# Patient Record
Sex: Female | Born: 1945 | Race: White | Hispanic: No | Smoking: Former smoker
Health system: Southern US, Community
[De-identification: ages and names within clinical notes are randomized; demographics above are authoritative.]

## PROBLEM LIST (undated history)

## (undated) DIAGNOSIS — C801 Malignant (primary) neoplasm, unspecified: Secondary | ICD-10-CM

## (undated) DIAGNOSIS — I10 Essential (primary) hypertension: Secondary | ICD-10-CM

## (undated) DIAGNOSIS — K635 Polyp of colon: Secondary | ICD-10-CM

## (undated) DIAGNOSIS — H40009 Preglaucoma, unspecified, unspecified eye: Secondary | ICD-10-CM

## (undated) DIAGNOSIS — H269 Unspecified cataract: Secondary | ICD-10-CM

## (undated) DIAGNOSIS — E785 Hyperlipidemia, unspecified: Secondary | ICD-10-CM

## (undated) DIAGNOSIS — K5790 Diverticulosis of intestine, part unspecified, without perforation or abscess without bleeding: Secondary | ICD-10-CM

## (undated) DIAGNOSIS — M810 Age-related osteoporosis without current pathological fracture: Secondary | ICD-10-CM

## (undated) DIAGNOSIS — K219 Gastro-esophageal reflux disease without esophagitis: Secondary | ICD-10-CM

## (undated) HISTORY — PX: COLONOSCOPY: SHX174

## (undated) HISTORY — PX: DEEP LEG / ANKLE TUMOR EXCISION: SUR572

## (undated) HISTORY — DX: Polyp of colon: K63.5

## (undated) HISTORY — DX: Preglaucoma, unspecified, unspecified eye: H40.009

## (undated) HISTORY — DX: Age-related osteoporosis without current pathological fracture: M81.0

## (undated) HISTORY — PX: TUBAL LIGATION: SHX77

## (undated) HISTORY — DX: Malignant (primary) neoplasm, unspecified: C80.1

## (undated) HISTORY — DX: Diverticulosis of intestine, part unspecified, without perforation or abscess without bleeding: K57.90

## (undated) HISTORY — DX: Hyperlipidemia, unspecified: E78.5

## (undated) HISTORY — PX: TONSILLECTOMY AND ADENOIDECTOMY: SHX28

## (undated) HISTORY — DX: Gastro-esophageal reflux disease without esophagitis: K21.9

## (undated) HISTORY — DX: Unspecified cataract: H26.9

---

## 1997-06-23 ENCOUNTER — Other Ambulatory Visit: Admission: RE | Admit: 1997-06-23 | Discharge: 1997-06-23 | Payer: Self-pay | Admitting: Obstetrics and Gynecology

## 1997-08-17 ENCOUNTER — Ambulatory Visit (HOSPITAL_COMMUNITY): Admission: RE | Admit: 1997-08-17 | Discharge: 1997-08-17 | Payer: Self-pay | Admitting: Internal Medicine

## 1998-03-23 ENCOUNTER — Ambulatory Visit (HOSPITAL_COMMUNITY): Admission: RE | Admit: 1998-03-23 | Discharge: 1998-03-23 | Payer: Self-pay | Admitting: Internal Medicine

## 1998-03-23 ENCOUNTER — Encounter: Payer: Self-pay | Admitting: Internal Medicine

## 1998-06-24 ENCOUNTER — Other Ambulatory Visit: Admission: RE | Admit: 1998-06-24 | Discharge: 1998-06-24 | Payer: Self-pay | Admitting: Obstetrics and Gynecology

## 1998-12-12 ENCOUNTER — Other Ambulatory Visit: Admission: RE | Admit: 1998-12-12 | Discharge: 1998-12-12 | Payer: Self-pay | Admitting: Obstetrics and Gynecology

## 1999-01-10 ENCOUNTER — Encounter (INDEPENDENT_AMBULATORY_CARE_PROVIDER_SITE_OTHER): Payer: Self-pay | Admitting: Specialist

## 1999-01-10 ENCOUNTER — Other Ambulatory Visit: Admission: RE | Admit: 1999-01-10 | Discharge: 1999-01-10 | Payer: Self-pay | Admitting: Obstetrics and Gynecology

## 1999-03-27 ENCOUNTER — Ambulatory Visit (HOSPITAL_COMMUNITY): Admission: RE | Admit: 1999-03-27 | Discharge: 1999-03-27 | Payer: Self-pay | Admitting: Internal Medicine

## 1999-07-19 ENCOUNTER — Other Ambulatory Visit: Admission: RE | Admit: 1999-07-19 | Discharge: 1999-07-19 | Payer: Self-pay | Admitting: Obstetrics and Gynecology

## 2000-03-27 ENCOUNTER — Ambulatory Visit (HOSPITAL_COMMUNITY): Admission: RE | Admit: 2000-03-27 | Discharge: 2000-03-27 | Payer: Self-pay | Admitting: Obstetrics

## 2000-08-16 ENCOUNTER — Other Ambulatory Visit: Admission: RE | Admit: 2000-08-16 | Discharge: 2000-08-16 | Payer: Self-pay | Admitting: Obstetrics and Gynecology

## 2001-04-01 ENCOUNTER — Ambulatory Visit (HOSPITAL_COMMUNITY): Admission: RE | Admit: 2001-04-01 | Discharge: 2001-04-01 | Payer: Self-pay | Admitting: Internal Medicine

## 2001-09-09 ENCOUNTER — Other Ambulatory Visit: Admission: RE | Admit: 2001-09-09 | Discharge: 2001-09-09 | Payer: Self-pay | Admitting: Obstetrics and Gynecology

## 2002-04-07 ENCOUNTER — Ambulatory Visit (HOSPITAL_COMMUNITY): Admission: RE | Admit: 2002-04-07 | Discharge: 2002-04-07 | Payer: Self-pay | Admitting: *Deleted

## 2002-10-19 ENCOUNTER — Other Ambulatory Visit: Admission: RE | Admit: 2002-10-19 | Discharge: 2002-10-19 | Payer: Self-pay | Admitting: Obstetrics and Gynecology

## 2003-04-12 ENCOUNTER — Ambulatory Visit (HOSPITAL_COMMUNITY): Admission: RE | Admit: 2003-04-12 | Discharge: 2003-04-12 | Payer: Self-pay | Admitting: Obstetrics and Gynecology

## 2003-11-02 ENCOUNTER — Other Ambulatory Visit: Admission: RE | Admit: 2003-11-02 | Discharge: 2003-11-02 | Payer: Self-pay | Admitting: Obstetrics and Gynecology

## 2003-12-14 ENCOUNTER — Ambulatory Visit: Payer: Self-pay | Admitting: Internal Medicine

## 2004-04-18 ENCOUNTER — Ambulatory Visit (HOSPITAL_COMMUNITY): Admission: RE | Admit: 2004-04-18 | Discharge: 2004-04-18 | Payer: Self-pay | Admitting: *Deleted

## 2004-12-07 ENCOUNTER — Other Ambulatory Visit: Admission: RE | Admit: 2004-12-07 | Discharge: 2004-12-07 | Payer: Self-pay | Admitting: Obstetrics and Gynecology

## 2004-12-11 ENCOUNTER — Ambulatory Visit: Payer: Self-pay | Admitting: Internal Medicine

## 2005-03-19 ENCOUNTER — Ambulatory Visit: Payer: Self-pay | Admitting: Internal Medicine

## 2005-05-08 ENCOUNTER — Ambulatory Visit (HOSPITAL_COMMUNITY): Admission: RE | Admit: 2005-05-08 | Discharge: 2005-05-08 | Payer: Self-pay | Admitting: Obstetrics and Gynecology

## 2006-05-14 ENCOUNTER — Ambulatory Visit (HOSPITAL_COMMUNITY): Admission: RE | Admit: 2006-05-14 | Discharge: 2006-05-14 | Payer: Self-pay | Admitting: Obstetrics and Gynecology

## 2006-07-02 ENCOUNTER — Ambulatory Visit: Payer: Self-pay | Admitting: Internal Medicine

## 2007-02-25 ENCOUNTER — Encounter: Payer: Self-pay | Admitting: Internal Medicine

## 2007-03-06 ENCOUNTER — Ambulatory Visit: Payer: Self-pay | Admitting: Internal Medicine

## 2007-03-06 DIAGNOSIS — E1169 Type 2 diabetes mellitus with other specified complication: Secondary | ICD-10-CM | POA: Insufficient documentation

## 2007-03-06 DIAGNOSIS — I10 Essential (primary) hypertension: Secondary | ICD-10-CM | POA: Insufficient documentation

## 2007-03-06 DIAGNOSIS — E782 Mixed hyperlipidemia: Secondary | ICD-10-CM

## 2007-05-27 ENCOUNTER — Ambulatory Visit (HOSPITAL_COMMUNITY): Admission: RE | Admit: 2007-05-27 | Discharge: 2007-05-27 | Payer: Self-pay | Admitting: Obstetrics and Gynecology

## 2007-10-30 ENCOUNTER — Telehealth (INDEPENDENT_AMBULATORY_CARE_PROVIDER_SITE_OTHER): Payer: Self-pay | Admitting: *Deleted

## 2008-11-10 ENCOUNTER — Encounter (INDEPENDENT_AMBULATORY_CARE_PROVIDER_SITE_OTHER): Payer: Self-pay | Admitting: *Deleted

## 2010-03-17 ENCOUNTER — Encounter: Payer: Self-pay | Admitting: Internal Medicine

## 2010-03-17 ENCOUNTER — Encounter (INDEPENDENT_AMBULATORY_CARE_PROVIDER_SITE_OTHER): Payer: Medicare PPO | Admitting: Internal Medicine

## 2010-03-17 ENCOUNTER — Other Ambulatory Visit: Payer: Self-pay | Admitting: Internal Medicine

## 2010-03-17 DIAGNOSIS — R030 Elevated blood-pressure reading, without diagnosis of hypertension: Secondary | ICD-10-CM

## 2010-03-17 DIAGNOSIS — Z23 Encounter for immunization: Secondary | ICD-10-CM

## 2010-03-17 DIAGNOSIS — Z0000 Encounter for general adult medical examination without abnormal findings: Secondary | ICD-10-CM

## 2010-03-17 DIAGNOSIS — E782 Mixed hyperlipidemia: Secondary | ICD-10-CM

## 2010-03-17 DIAGNOSIS — Z860100 Personal history of colon polyps, unspecified: Secondary | ICD-10-CM | POA: Insufficient documentation

## 2010-03-17 DIAGNOSIS — E785 Hyperlipidemia, unspecified: Secondary | ICD-10-CM

## 2010-03-17 DIAGNOSIS — K219 Gastro-esophageal reflux disease without esophagitis: Secondary | ICD-10-CM

## 2010-03-17 LAB — CBC WITH DIFFERENTIAL/PLATELET
Basophils Relative: 0.4 % (ref 0.0–3.0)
HCT: 43 % (ref 36.0–46.0)
Hemoglobin: 14.5 g/dL (ref 12.0–15.0)
Lymphocytes Relative: 27.2 % (ref 12.0–46.0)
MCHC: 33.7 g/dL (ref 30.0–36.0)
Monocytes Relative: 7.4 % (ref 3.0–12.0)
Neutro Abs: 5.3 10*3/uL (ref 1.4–7.7)
RBC: 4.52 Mil/uL (ref 3.87–5.11)

## 2010-03-17 LAB — LIPID PANEL
Total CHOL/HDL Ratio: 4
Triglycerides: 80 mg/dL (ref 0.0–149.0)

## 2010-03-17 LAB — BASIC METABOLIC PANEL
CO2: 30 mEq/L (ref 19–32)
Calcium: 9.5 mg/dL (ref 8.4–10.5)
GFR: 108.74 mL/min (ref >60.00)
Potassium: 4.1 mEq/L (ref 3.5–5.1)
Sodium: 141 mEq/L (ref 135–145)

## 2010-03-17 LAB — HEPATIC FUNCTION PANEL
AST: 19 U/L (ref 0–37)
Albumin: 3.9 g/dL (ref 3.5–5.2)
Alkaline Phosphatase: 49 U/L (ref 39–117)
Total Protein: 6.7 g/dL (ref 6.0–8.3)

## 2010-03-23 NOTE — Assessment & Plan Note (Signed)
Summary: cpx/kn   Vital Signs:  Patient profile:   65 year old female Height:      65 inches Weight:      161.2 pounds BMI:     26.92 Temp:     98.4 degrees F oral Pulse rate:   64 / minute Resp:     14 per minute BP sitting:   130 / 86  (left arm) Cuff size:   large  Vitals Entered By: Shonna Chock CMA (March 17, 2010 8:24 AM) CC: CPX with fasting labs , Heartburn, Lipid Management Comments Patient refused EKG   CC:  CPX with fasting labs , Heartburn, and Lipid Management.  History of Present Illness:    Susan Holland is here for her initial Medicare Wellness physical; she describes frequent "gas" issues w/o bloating &  weight gain of 10#, but denies acid reflux, sour taste in mouth, epigastric pain, chest pain, and trouble swallowing.  The patient denies the following alarm features: melena, dysphagia, hematemesis, and vomiting.  Symptoms are worse  after meals. Gas-X has been  partially effective:      Hyperlipidemia Follow-Up: Statin Rx  was never filled.The patient denies the following symptoms: exercise intolerance, dypsnea, palpitations, syncope, and pedal edema.  Dietary compliance has been fair.  The patient reports no exercise.  Adjunctive measures currently used by the patient include ASA and fish oil supplements.      Hypertension Follow-Up:She is not on BP meds. The patient denies lightheadedness, urinary frequency, headaches, and fatigue.  Adjunctive measures currently used by the patient include salt restriction.                                                                                                                                                          Medicare AWV: 1.Risk factors based on Past M, S, F history:see Diagnoses; chart updated 2.Physical Activities: see above 3.Depression/mood:depression denied  4.Hearing:no deficit to whisper @ 6 ft  5.ADL's: no limitations 6.Fall Risk:denied  7.Home Safety:no issues  8.Height, weight, &visual acuity:wall  chart read @ 6 ft 9.Counseling: POA & Living Will discussed 10.Labs ordered based on risk factors: see Orders 11.Referral Coordination: see Preventive Care recommendations( Colonoscopy 2005; PAP 2010;mammograms 2010; BMD 2010)Flu & Tetanus immunizations up to date. 12.Care Plan:see Instructions 13.  Cognitive Assessment : Oriented X 3; memory & recall good; mood & affect normal; math performance good.  Lipid Management History:      Positive NCEP/ATP III risk factors include female age 43 years old or older, early menopause without estrogen hormone replacement, current tobacco user, and hypertension.  Negative NCEP/ATP III risk factors include non-diabetic, no family history for ischemic heart disease, no ASHD (atherosclerotic heart disease), no prior stroke/TIA, no peripheral vascular disease, and no history of aortic aneurysm.     Preventive Screening-Counseling &  Management  Alcohol-Tobacco     Alcohol drinks/day: 0     Smoking Status: current     Packs/Day: <0.25     Year Started: 1965  Caffeine-Diet-Exercise     Caffeine use/day: > 3 / day  Hep-HIV-STD-Contraception     Dental Visit-last 6 months yes     Sun Exposure-Excessive: no  Safety-Violence-Falls     Seat Belt Use: yes     Smoke Detectors: yes      Blood Transfusions:  no.        Travel History:  never.    Current Medications (verified): 1)  Fish Oil 2)  Calcium 3)  Baby Asa 4)  Multivitamin  Allergies (verified): No Known Drug Allergies  Past History:  Past Medical History: Hypercholesterolemia: NMR Lipoprofile 2007: LDL 165( 1909/1024), HDL 45, TG 108. Framingham Study LDL goal = < 130. Benign tumor right ankle resected X2 , Dr Marlyne Beards , [place] 1997 Colonic polyps, PMH  of, Hyperplastic   Past Surgical History: gravida 2 para 2; R ankle surgey X 2 for benign lesion  Carpal tunnel surgery  right wrist 1999 Colonoscopy polyp 12/2003( due 2012 as per Dr Juanda Chance)  Family History: father: died  @ age 39  CVA mother:[place],s sister: elevated lipids, HTN, AF  Social History: Current Smoker:@ least  1 cigarette/ day  Retired Married Alcohol use-yes: occasionally Caffeine use/day:  > 3 / day Dental Care w/in 6 mos.:  yes Sun Exposure-Excessive:  no Seat Belt Use:  yes Blood Transfusions:  no Packs/Day:  <0.25  Review of Systems  The patient denies anorexia, fever, vision loss, decreased hearing, hoarseness, prolonged cough, hemoptysis, hematuria, suspicious skin lesions, depression, unusual weight change, abnormal bleeding, enlarged lymph nodes, and angioedema.    Physical Exam  General:  well-nourished,in no acute distress; alert,appropriate and cooperative throughout examination Head:  Normocephalic and atraumatic without obvious abnormalities.  Eyes:  No corneal or conjunctival inflammation noted. Perrla. Funduscopic exam benign, without hemorrhages, exudates or papilledema. Vision grossly normal to wall chart. Ears:  External ear exam shows no significant lesions or deformities.  Otoscopic examination reveals clear canals, tympanic membranes are intact bilaterally without bulging, retraction, inflammation or discharge. Hearing is grossly normal bilaterally. Nose:  External nasal examination shows no deformity or inflammation. Nasal mucosa are pink and moist without lesions or exudates. Mouth:  Oral mucosa and oropharynx without lesions or exudates.  Teeth in good repair. Neck:  No deformities, masses, or tenderness noted. Lungs:  Normal respiratory effort, chest expands symmetrically. Lungs are clear to auscultation, no crackles or wheezes. Heart:  normal rate, regular rhythm, no gallop, no rub, no JVD, no HJR, and grade 1 /6 systolic murmur.   Abdomen:  Bowel sounds positive,abdomen soft and non-tender without masses, organomegaly or hernias noted. Genitalia:  Gyn F/U overdue ; risks discussed Msk:  No deformity or scoliosis noted of thoracic or lumbar spine.   Pulses:  R and L  carotid,radial,dorsalis pedis and posterior tibial pulses are full and equal bilaterally Extremities:  No clubbing, cyanosis, edema, or deformity noted with normal full range of motion of all joints.   Neurologic:  alert & oriented X3 and DTRs symmetrical and normal.   Skin:  Intact without suspicious lesions or rashes Cervical Nodes:  No lymphadenopathy noted Axillary Nodes:  No palpable lymphadenopathy Psych:  memory intact for recent and remote, normally interactive, and good eye contact.     Impression & Recommendations:  Problem # 1:  PREVENTIVE HEALTH CARE (ICD-V70.0)  Orders: Medicare -  1st Annual Wellness Visit [phone])  Problem # 2:  GERD (ICD-530.81)  Clinical presentation as  "gas"  Orders: Venipuncture (01027) TLB-CBC Platelet - w/Differential (85025-CBCD) Specimen Handling (25366)  Problem # 3:  ELEVATED BLOOD PRESSURE WITHOUT DIAGNOSIS OF HYPERTENSION (ICD-796.2) BP goal discussed The following medications were removed from the medication list:    Lisinopril 20 Mg Tabs (Lisinopril) .Marland Kitchen... 1 once daily if bp averages > 130/85  Orders: EKG w/ Interpretation (93000) Venipuncture (44034) TLB-BMP (Basic Metabolic Panel-BMET) (80048-METABOL) Specimen Handling (74259)  Problem # 4:  HYPERLIPIDEMIA (ICD-272.2)  The following medications were removed from the medication list:    Pravastatin Sodium 40 Mg Tabs (Pravastatin sodium) .Marland Kitchen... 1 qhs  Orders: Venipuncture (56387) TLB-Lipid Panel (80061-LIPID) TLB-Hepatic/Liver Function Pnl (80076-HEPATIC) TLB-TSH (Thyroid Stimulating Hormone) (84443-TSH) Specimen Handling (56433)  Problem # 5:  COLONIC POLYPS, HX OF (ICD-V12.72) as per Dr Juanda Chance Orders: Venipuncture (29518) TLB-CBC Platelet - w/Differential (85025-CBCD)  Complete Medication List: 1)  Fish Oil  2)  Calcium  3)  Baby Asa  4)  Multivitamin   Other Orders: Tdap => 79yrs IM (84166) Admin 1st Vaccine (06301)  Lipid Assessment/Plan:      Based on  NCEP/ATP III, the patient's risk factor category is "0-1 risk factors".  The patient's lipid goals are as follows: Total cholesterol goal is 200; LDL cholesterol goal is 130; HDL cholesterol goal is 40; Triglyceride goal is 150.    Patient Instructions: 1)  Schedule your mammogram. 2)  You need to have a Pap Smear @ least every 2 years  to  screen for  cervical cancer. 3)  Check your Blood Pressure regularly. If it is above:  135/85 ON AVERAGE you should make an appointment. 4)  Stop Smoking Tips: Choose a Quit date. Cut down before the Quit date. decide what you will do as a substitute when you feel the urge to smoke(gum,toothpick,exercise). 5)  It is important that you exercise regularly at least 20 minutes 5 times a week. If you develop chest pain, have severe difficulty breathing, or feel very tired , stop exercising immediately and seek medical attention.   Orders Added: 1)  Tdap => 8yrs IM [90715] 2)  Admin 1st Vaccine [90471] 3)  Medicare -1st Annual Wellness Visit [G0438] 4)  Est. Patient Level III [60109] 5)  EKG w/ Interpretation [93000] 6)  Venipuncture [36415] 7)  TLB-Lipid Panel [80061-LIPID] 8)  TLB-BMP (Basic Metabolic Panel-BMET) [80048-METABOL] 9)  TLB-CBC Platelet - w/Differential [85025-CBCD] 10)  TLB-Hepatic/Liver Function Pnl [80076-HEPATIC] 11)  TLB-TSH (Thyroid Stimulating Hormone) [84443-TSH] 12)  Specimen Handling [99000]   Immunizations Administered:  Tetanus Vaccine:    Vaccine Type: Tdap    Site: right deltoid    Mfr: GlaxoSmithKline    Dose: 0.5 ml    Route: IM    Given by: Shonna Chock CMA    Exp. Date: 11/25/2011    Lot #: [id number]    VIS given: 12/24/07 version given March 17, 2010.   Immunizations Administered:  Tetanus Vaccine:    Vaccine Type: Tdap    Site: right deltoid    Mfr: GlaxoSmithKline    Dose: 0.5 ml    Route: IM    Given by: Shonna Chock CMA    Exp. Date: 11/25/2011    Lot #: [id number]    VIS given: 12/24/07 version  given March 17, 2010.

## 2010-04-04 ENCOUNTER — Other Ambulatory Visit: Payer: Self-pay | Admitting: Family Medicine

## 2010-04-24 ENCOUNTER — Encounter (INDEPENDENT_AMBULATORY_CARE_PROVIDER_SITE_OTHER): Payer: Medicare PPO | Admitting: Occupational Therapy

## 2010-04-24 ENCOUNTER — Other Ambulatory Visit: Payer: Self-pay

## 2010-04-24 ENCOUNTER — Other Ambulatory Visit (HOSPITAL_COMMUNITY)
Admission: RE | Admit: 2010-04-24 | Discharge: 2010-04-24 | Disposition: A | Discharge status: Home / Self Care | Payer: Medicare PPO | Source: Ambulatory Visit | Attending: Family Medicine | Admitting: Family Medicine

## 2010-04-24 DIAGNOSIS — Z01419 Encounter for gynecological examination (general) (routine) without abnormal findings: Secondary | ICD-10-CM | POA: Insufficient documentation

## 2010-04-28 NOTE — Progress Notes (Signed)
Susan Holland, Susan Holland              ACCOUNT NO.:  [id number]  MEDICAL RECORD NO.:  [id number]           PATIENT TYPE:  A  LOCATION:  [facility]                   FACILITY:  [facility]  PHYSICIAN:  Waldron Labs  DATE OF BIRTH:  09/22/1945  DATE OF SERVICE:  04/24/2010                                 CLINIC NOTE  REASON FOR TODAY'S VISIT:  Pap smear.  HISTORY OF PRESENT ILLNESS:  Susan Holland is a 65 year old G2, P2 who is postmenopausal about 25 years, here for her annual exam.  She is without complaints today except ongoing issues with excessive gas.  She does not have any pain.  She does have flatulence.  This is a new problem and she is not having any issues with bowel or bladder control, but she does say she has trouble controlling her flatulence.  She has tried Gas-X and dietary changes and none of this has helped.  REVIEW OF SYSTEMS:  Otherwise negative.  GYNECOLOGIC AND OB HISTORY:  The last Pap smears in 2010 that was normal.  She has no history of abnormal Pap smears.  She had been pregnant twice and has had 2 normal vaginal deliveries.  PAST MEDICAL HISTORY:  Negative.  SURGICAL HISTORY:  She had a surgery on her fallopian tubes 30+ years ago when she was trying to conceive due to fertility.  No other surgeries.  FAMILY HISTORY:  Positive for high blood pressure in her father and sister.  MEDICATIONS:  See med list.  ALLERGIES:  NKDA.  OBJECTIVE:  VITAL SIGNS:  Temperature 98.4, pulse 61, blood pressure 141/89, weight was 156.6. GENERAL:  Well-appearing female in no acute distress. ABDOMEN:  Soft and nontender. PELVIC:  External genitalia within normal limits, slightly atrophic vaginal mucosa and cervix.  No vaginal discharge.  Pap smear collected from cervical os. PSYCHIATRY:  A and O x4. EXTREMITIES:  Full range of motion, within normal limits.  ASSESSMENT AND PLAN:  A 65 year old G2, P2 postmenopausal who is here for well-woman exam.  Pap smear  collected today.  We discussed Pap smear guidelines.  The patient will return annually for exams and we will plan on repeat Pap smear in 3 years or sooner as indicated.  She had a mammogram yesterday and will follow up __________.  She has a colonoscopy scheduled in July 2012.  She will see her primary care doctor regarding her complaints of gas or other primary care issues. Her blood pressure was slightly elevated today, which she says usually when she sees a doctor where she will follow up with her primary care doctor as needed.  She will return here annually or with other gynecologic complaints and concerns.          ______________________________ Waldron Labs    NKF/MEDQ  D:  04/24/2010  T:  04/25/2010  Job:  [id number]

## 2010-05-08 ENCOUNTER — Encounter: Payer: Self-pay | Admitting: Internal Medicine

## 2010-05-15 ENCOUNTER — Encounter: Payer: Self-pay | Admitting: Internal Medicine

## 2010-06-22 ENCOUNTER — Other Ambulatory Visit (HOSPITAL_COMMUNITY)
Admission: RE | Admit: 2010-06-22 | Discharge: 2010-06-22 | Disposition: A | Discharge status: Home / Self Care | Payer: Medicare PPO | Source: Ambulatory Visit | Attending: Obstetrics & Gynecology | Admitting: Obstetrics & Gynecology

## 2010-06-22 ENCOUNTER — Other Ambulatory Visit: Payer: Self-pay | Admitting: Obstetrics & Gynecology

## 2010-06-22 ENCOUNTER — Encounter (INDEPENDENT_AMBULATORY_CARE_PROVIDER_SITE_OTHER): Payer: Medicare PPO | Admitting: Obstetrics & Gynecology

## 2010-06-22 DIAGNOSIS — R87619 Unspecified abnormal cytological findings in specimens from cervix uteri: Secondary | ICD-10-CM

## 2010-07-20 ENCOUNTER — Ambulatory Visit: Payer: Medicare PPO | Admitting: Obstetrics & Gynecology

## 2010-11-24 ENCOUNTER — Encounter: Payer: Self-pay | Admitting: Internal Medicine

## 2011-02-08 ENCOUNTER — Encounter: Payer: Self-pay | Admitting: Internal Medicine

## 2011-02-13 ENCOUNTER — Encounter: Payer: Self-pay | Admitting: Internal Medicine

## 2011-02-27 ENCOUNTER — Ambulatory Visit (AMBULATORY_SURGERY_CENTER): Payer: MEDICARE

## 2011-02-27 ENCOUNTER — Encounter: Payer: Self-pay | Admitting: Internal Medicine

## 2011-02-27 VITALS — Ht 64.5 in | Wt 161.6 lb

## 2011-02-27 DIAGNOSIS — Z1211 Encounter for screening for malignant neoplasm of colon: Secondary | ICD-10-CM

## 2011-02-27 DIAGNOSIS — Z86010 Personal history of colonic polyps: Secondary | ICD-10-CM

## 2011-02-27 MED ORDER — PEG-KCL-NACL-NASULF-NA ASC-C 100 G PO SOLR: 1.0000 | Freq: Once | ORAL | Status: AC

## 2011-03-09 HISTORY — PX: COLONOSCOPY W/ POLYPECTOMY: SHX1380

## 2011-03-13 ENCOUNTER — Ambulatory Visit (AMBULATORY_SURGERY_CENTER): Payer: MEDICARE | Admitting: Internal Medicine

## 2011-03-13 ENCOUNTER — Encounter: Payer: Self-pay | Admitting: Internal Medicine

## 2011-03-13 VITALS — BP 144/83 | HR 62 | Temp 97.6°F | Resp 16 | Ht 64.5 in | Wt 161.0 lb

## 2011-03-13 DIAGNOSIS — D126 Benign neoplasm of colon, unspecified: Secondary | ICD-10-CM

## 2011-03-13 DIAGNOSIS — Z1211 Encounter for screening for malignant neoplasm of colon: Secondary | ICD-10-CM

## 2011-03-13 MED ORDER — SODIUM CHLORIDE 0.9 % IV SOLN: 500.0000 mL | INTRAVENOUS | Status: DC

## 2011-03-13 NOTE — Op Note (Signed)
[facility] [address] [place], [place]  [address]  COLONOSCOPY PROCEDURE REPORT  PATIENT:  Susan Holland, Susan Holland  MR#:  [id number] BIRTHDATE:  Jul 05, 1945, 65 yrs. old  GENDER:  female ENDOSCOPIST:  Hedwig Morton. Juanda Chance, MD REF. BY: PROCEDURE DATE:  03/13/2011 PROCEDURE:  Colonoscopy with biopsy and snare polypectomy ASA CLASS:  Class II INDICATIONS:  history of hyperplastic polyps colon polyp 1999 and 2005 MEDICATIONS:   MAC sedation, administered by CRNA, propofol (Diprivan) 400 mg  DESCRIPTION OF PROCEDURE:   After the risks and benefits and of the procedure were explained, informed consent was obtained. Digital rectal exam was performed and revealed no rectal masses. The LB [id number]-[id number] endoscope was introduced through the anus and advanced to the cecum, which was identified by the ileocecal valve.  The quality of the prep was good, using MoviPrep.  The instrument was then slowly withdrawn as the colon was fully examined. <<PROCEDUREIMAGES>>  FINDINGS:  There were multiple polyps identified and removed. 3 diminutive polyps at 20 cm, 10 mm polyp at 15 cm The polyps were removed using cold biopsy forceps. Polyp was snared without cautery. Retrieval was successful (see image6, image8, image9, and image7). snare polyp  Moderate diverticulosis was found throughout the colon (see image3, image2, and image1).  This was otherwise a normal examination of the colon (see image5 and image4). Retroflexed views in the rectum revealed no abnormalities.    The scope was then withdrawn from the patient and the procedure completed.  COMPLICATIONS:  None ENDOSCOPIC IMPRESSION: 1) Polyps, multiple 2) Moderate diverticulosis throughout the colon 3) Otherwise normal examination RECOMMENDATIONS: 1) Await pathology results 2) High fiber diet.  REPEAT EXAM:  In 7 year(s) for.  ______________________________ Hedwig Morton. Juanda Chance, MD  CC:  Pecola Lawless, MD  n. Rosalie DoctorHedwig Morton.  Halei Hanover at 03/13/2011 11:48 AM  Beryle Flock, [phone]

## 2011-03-13 NOTE — Progress Notes (Signed)
Patient did not experience any of the following events: a burn prior to discharge; a fall within the facility; wrong site/side/patient/procedure/implant event; or a hospital transfer or hospital admission upon discharge from the facility. (G8907) Patient did not have preoperative order for IV antibiotic SSI prophylaxis. (G8918)  

## 2011-03-13 NOTE — Patient Instructions (Addendum)
Please refer to your blue and neon green sheets for instructions regarding diet and activity for the rest of today.  You may resume your medications as you would normally take them.   Please take an over the counter probiotic such as Align and Metamucil to increase your fiber intake and to help regulate your bowels.  Diverticulosis Diverticulosis is a common condition that develops when small pouches (diverticula) form in the wall of the colon. The risk of diverticulosis increases with age. It happens more often in people who eat a low-fiber diet. Most individuals with diverticulosis have no symptoms. Those individuals with symptoms usually experience abdominal pain, constipation, or loose stools (diarrhea). HOME CARE INSTRUCTIONS   Increase the amount of fiber in your diet as directed by your caregiver or dietician. This may reduce symptoms of diverticulosis.   Your caregiver may recommend taking a dietary fiber supplement.   Drink at least 6 to 8 glasses of water each day to prevent constipation.   Try not to strain when you have a bowel movement.   Your caregiver may recommend avoiding nuts and seeds to prevent complications, although this is still an uncertain benefit.   Only take over-the-counter or prescription medicines for pain, discomfort, or fever as directed by your caregiver.  FOODS WITH HIGH FIBER CONTENT INCLUDE:  Fruits. Apple, peach, pear, tangerine, raisins, prunes.   Vegetables. Brussels sprouts, asparagus, broccoli, cabbage, carrot, cauliflower, romaine lettuce, spinach, summer squash, tomato, winter squash, zucchini.   Starchy Vegetables. Baked beans, kidney beans, lima beans, split peas, lentils, potatoes (with skin).   Grains. Whole wheat bread, brown rice, bran flake cereal, plain oatmeal, white rice, shredded wheat, bran muffins.  SEEK IMMEDIATE MEDICAL CARE IF:   You develop increasing pain or severe bloating.   You have an oral temperature above 102 F  (38.9 C), not controlled by medicine.   You develop vomiting or bowel movements that are bloody or black.  Document Released: 10/20/2003 Document Revised: 10/04/2010 Document Reviewed: 06/22/2009 [facility] Patient Information 2012 [place], [place].  Colon Polyps A polyp is extra tissue that grows inside your body. Colon polyps grow in the large intestine. The large intestine, also called the colon, is part of your digestive system. It is a long, hollow tube at the end of your digestive tract where your body makes and stores stool. Most polyps are not dangerous. They are benign. This means they are not cancerous. But over time, some types of polyps can turn into cancer. Polyps that are smaller than a pea are usually not harmful. But larger polyps could someday become or may already be cancerous. To be safe, doctors remove all polyps and test them.  WHO GETS POLYPS? Anyone can get polyps, but certain people are more likely than others. You may have a greater chance of getting polyps if:  You are over 50.   You have had polyps before.   Someone in your family has had polyps.   Someone in your family has had cancer of the large intestine.   Find out if someone in your family has had polyps. You may also be more likely to get polyps if you:   Eat a lot of fatty foods.   Smoke.   Drink alcohol.   Do not exercise.   Eat too much.  SYMPTOMS  Most small polyps do not cause symptoms. People often do not know they have one until their caregiver finds it during a regular checkup or while testing them for something else.  Some people do have symptoms like these:  Bleeding from the anus. You might notice blood on your underwear or on toilet paper after you have had a bowel movement.   Constipation or diarrhea that lasts more than a week.   Blood in the stool. Blood can make stool look black or it can show up as red streaks in the stool.  If you have any of these symptoms, see your  caregiver. HOW DOES THE DOCTOR TEST FOR POLYPS? The doctor can use four tests to check for polyps:  Digital rectal exam. The caregiver wears gloves and checks your rectum (the last part of the large intestine) to see if it feels normal. This test would find polyps only in the rectum. Your caregiver may need to do one of the other tests listed below to find polyps higher up in the intestine.   Barium enema. The caregiver puts a liquid called barium into your rectum before taking x-rays of your large intestine. Barium makes your intestine look white in the pictures. Polyps are dark, so they are easy to see.   Sigmoidoscopy. With this test, the caregiver can see inside your large intestine. A thin flexible tube is placed into your rectum. The device is called a sigmoidoscope, which has a light and a tiny video camera in it. The caregiver uses the sigmoidoscope to look at the last third of your large intestine.   Colonoscopy. This test is like sigmoidoscopy, but the caregiver looks at all of the large intestine. It usually requires sedation. This is the most common method for finding and removing polyps.  TREATMENT   The caregiver will remove the polyp during sigmoidoscopy or colonoscopy. The polyp is then tested for cancer.   If you have had polyps, your caregiver may want you to get tested regularly in the future.  PREVENTION  There is not one sure way to prevent polyps. You might be able to lower your risk of getting them if you:  Eat more fruits and vegetables and less fatty food.   Do not smoke.   Avoid alcohol.   Exercise every day.   Lose weight if you are overweight.   Eating more calcium and folate can also lower your risk of getting polyps. Some foods that are rich in calcium are milk, cheese, and broccoli. Some foods that are rich in folate are chickpeas, kidney beans, and spinach.   Aspirin might help prevent polyps. Studies are under way.  Document Released: 10/19/2003  Document Revised: 10/04/2010 Document Reviewed: 03/26/2007 [facility] Patient Information 2012 [place], [place].

## 2011-03-14 ENCOUNTER — Telehealth: Payer: Self-pay | Admitting: *Deleted

## 2011-03-14 NOTE — Telephone Encounter (Signed)
  Follow up Call-  Call back number 03/13/2011  Post procedure Call Back phone  # [phone]  Permission to leave phone message Yes     Patient questions:  Do you have a fever, pain , or abdominal swelling? no Pain Score  0 *  Have you tolerated food without any problems? yes  Have you been able to return to your normal activities? yes  Do you have any questions about your discharge instructions: Diet   no Medications  no Follow up visit  no  Do you have questions or concerns about your Care? No  Actions: * If pain score is 4 or above: No action needed, pain <4.

## 2011-03-19 ENCOUNTER — Encounter: Payer: Self-pay | Admitting: Internal Medicine

## 2011-03-19 ENCOUNTER — Ambulatory Visit (INDEPENDENT_AMBULATORY_CARE_PROVIDER_SITE_OTHER): Payer: MEDICARE | Admitting: Internal Medicine

## 2011-03-19 VITALS — BP 144/86 | HR 68 | Temp 98.2°F | Resp 12 | Ht 65.0 in | Wt 159.0 lb

## 2011-03-19 DIAGNOSIS — Z85820 Personal history of malignant melanoma of skin: Secondary | ICD-10-CM | POA: Insufficient documentation

## 2011-03-19 DIAGNOSIS — R030 Elevated blood-pressure reading, without diagnosis of hypertension: Secondary | ICD-10-CM

## 2011-03-19 DIAGNOSIS — IMO0002 Reserved for concepts with insufficient information to code with codable children: Secondary | ICD-10-CM

## 2011-03-19 DIAGNOSIS — Z85828 Personal history of other malignant neoplasm of skin: Secondary | ICD-10-CM | POA: Insufficient documentation

## 2011-03-19 DIAGNOSIS — Z0000 Encounter for general adult medical examination without abnormal findings: Secondary | ICD-10-CM

## 2011-03-19 DIAGNOSIS — M8580 Other specified disorders of bone density and structure, unspecified site: Secondary | ICD-10-CM

## 2011-03-19 DIAGNOSIS — K219 Gastro-esophageal reflux disease without esophagitis: Secondary | ICD-10-CM

## 2011-03-19 DIAGNOSIS — C4490 Unspecified malignant neoplasm of skin, unspecified: Secondary | ICD-10-CM

## 2011-03-19 DIAGNOSIS — IMO0001 Reserved for inherently not codable concepts without codable children: Secondary | ICD-10-CM

## 2011-03-19 DIAGNOSIS — E782 Mixed hyperlipidemia: Secondary | ICD-10-CM

## 2011-03-19 DIAGNOSIS — Z23 Encounter for immunization: Secondary | ICD-10-CM

## 2011-03-19 LAB — BASIC METABOLIC PANEL
CO2: 30 mEq/L (ref 19–32)
Chloride: 106 mEq/L (ref 96–112)
Glucose, Bld: 99 mg/dL (ref 70–99)
Potassium: 4.1 mEq/L (ref 3.5–5.1)
Sodium: 140 mEq/L (ref 135–145)

## 2011-03-19 LAB — CBC WITH DIFFERENTIAL/PLATELET
Basophils Absolute: 0 10*3/uL (ref 0.0–0.1)
Eosinophils Absolute: 0 10*3/uL (ref 0.0–0.7)
HCT: 44.6 % (ref 36.0–46.0)
Hemoglobin: 15 g/dL (ref 12.0–15.0)
Lymphs Abs: 2 10*3/uL (ref 0.7–4.0)
MCHC: 33.7 g/dL (ref 30.0–36.0)
Monocytes Relative: 4.6 % (ref 3.0–12.0)
Neutro Abs: 6.4 10*3/uL (ref 1.4–7.7)
RDW: 13.8 % (ref 11.5–14.6)

## 2011-03-19 LAB — LIPID PANEL
HDL: 57.1 mg/dL (ref >39.00)
Total CHOL/HDL Ratio: 4

## 2011-03-19 LAB — HEPATIC FUNCTION PANEL
AST: 17 U/L (ref 0–37)
Albumin: 4.2 g/dL (ref 3.5–5.2)

## 2011-03-19 LAB — TSH: TSH: 1.5 u[IU]/mL (ref 0.35–5.50)

## 2011-03-19 NOTE — Patient Instructions (Addendum)
Preventive Health Care: Exercise  30-45  minutes a day, 3-4 days a week. Walking is especially valuable in preventing Osteoporosis. Eat a low-fat diet with lots of fruits and vegetables, up to 7-9 servings per day. Consume less than 30 grams of sugar per day from foods & drinks with High Fructose Corn Syrup as #1 ,2,3 or #4 on label. Blood Pressure Goal  Ideally is an AVERAGE < 135/85. This AVERAGE should be calculated from @ least 5-7 BP readings taken @ different times of day on different days of week. You should not respond to isolated BP readings , but rather the AVERAGE for that week Please bring your  blood pressure cuff to office visits to verify that it is reliable.It  can also be checked against the blood pressure device at the pharmacy. Finger or wrist cuffs are not dependable; an arm cuff is. Please think about quitting smoking. Review the risks we discussed. Please call 1-800-QUIT-NOW (248-495-7335) for free smoking cessation counseling.

## 2011-03-19 NOTE — Progress Notes (Signed)
Subjective:    Patient ID: Susan Holland, female    DOB: 02/09/1945, 66 y.o.   MRN: [id number]  HPI Medicare Wellness Visit:  The following psychosocial & medical history were reviewed as required by Medicare.   Social history: caffeine: 3-4 cups / day , alcohol:  Occasionally in Summer ,  tobacco use : occasionally & exercise : walking 3-4X/ week.   Home & personal  safety / fall risk:no issues, activities of daily living: no limitations , seatbelt use : yes , and smoke alarm employment : yes .  Power of Attorney/Living Will status : in place  Vision ( as recorded per Nurse) & Hearing  evaluation :  See exam. Orientation :oriented X 3 , memory & recall :good, spelling or math testing: good,and mood & affect : normal . Depression / anxiety: denied Travel history : never , immunization status : PNA needed , transfusion history:  no, and preventive health surveillance ( colonoscopies, BMD , etc as per protocol/ [facility]): colonoscopy last week, Dental care:  Every 4 mos . Chart reviewed &  Updated. Active issues reviewed & addressed.       Review of Systems PMH of ELEVATED BP w/o HYPERTENSION: Disease Monitoring: Blood pressure range-120/72-141/85 Chest pain, palpitations- no       Dyspnea- no Lightheadedness,Syncope-no    Edema- no  HYPERLIPIDEMIA: Disease Monitoring: See symptoms for Hypertension Medications: Compliance- never took statin ; "I want to avoid that"  WUJ:WJXBJYNW/GNFAOZ/HYQMVH- no     Visual problems- cataracts being monitored Abd pain, bowel changes- no   Muscle aches- no       Objective:   Physical Exam Gen.: Healthy and well-nourished in appearance. Alert, appropriate and cooperative throughout exam. Head: Normocephalic without obvious abnormalities  Eyes: No corneal or conjunctival inflammation noted. Pupils equal round reactive to light and accommodation. Fundal exam is benign without hemorrhages, exudate, papilledema. Extraocular motion intact. Vision  grossly normal. Ears: External  ear exam reveals no significant lesions or deformities. Canals clear .TMs normal. Hearing is grossly normal bilaterally. Nose: External nasal exam reveals no deformity or inflammation. Nasal mucosa are pink and moist. No lesions or exudates noted.  Mouth: Oral mucosa and oropharynx reveal no lesions or exudates. Teeth in good repair. Neck: No deformities, masses, or tenderness noted. Range of motion &Thyroid normal Lungs: Normal respiratory effort; chest expands symmetrically. Lungs are clear to auscultation without rales, wheezes, or increased work of breathing. Heart: Normal rate and rhythm. Normal S1 and S2. No gallop, click, or rub. Grade 1/6 systolic murmur . Abdomen: Bowel sounds normal; abdomen soft and nontender. No masses, organomegaly or hernias noted. Genitalia: Seen last year  .                                                                                   Musculoskeletal/extremities: No deformity or scoliosis noted of  the thoracic or lumbar spine. No clubbing, cyanosis, edema, or deformity noted. Range of motion  normal .Tone & strength  normal.Joints normal. Nail health  good. Vascular: Carotid, radial artery, dorsalis pedis and  posterior tibial pulses are full and equal. No bruits present. Neurologic: Alert and oriented x3. Deep tendon reflexes symmetrical  and normal.          Skin: Intact without suspicious lesions or rashes. Lymph: No cervical, axillary lymphadenopathy present. Psych: Mood and affect are normal. Normally interactive                                                                                         Assessment & Plan:  #1 Medicare Wellness Exam; criteria met ; data entered #2 Problem List reviewed ; Assessment/ Recommendations made EKG reveals a short PR interval. She has no definite hypertensive or ischemic changes. She was asked to report any palpitations or significant change in heart rhythm. As noted she does have  increased caffeine consumption. Plan: see Orders

## 2011-03-20 LAB — VITAMIN D 25 HYDROXY (VIT D DEFICIENCY, FRACTURES): Vit D, 25-Hydroxy: 57 ng/mL (ref 30–89)

## 2011-03-22 ENCOUNTER — Other Ambulatory Visit: Payer: Self-pay | Admitting: *Deleted

## 2011-03-22 MED ORDER — PRAVASTATIN SODIUM 20 MG PO TABS: 20.0000 mg | ORAL_TABLET | Freq: Every day | ORAL | Status: DC

## 2011-05-10 ENCOUNTER — Encounter: Payer: Self-pay | Admitting: Internal Medicine

## 2012-05-01 ENCOUNTER — Telehealth: Payer: Self-pay | Admitting: Internal Medicine

## 2012-05-15 ENCOUNTER — Encounter: Payer: Self-pay | Admitting: Internal Medicine

## 2013-03-26 ENCOUNTER — Encounter: Payer: Self-pay | Admitting: Internal Medicine

## 2013-04-13 ENCOUNTER — Encounter: Payer: Self-pay | Admitting: Internal Medicine

## 2013-04-13 ENCOUNTER — Ambulatory Visit (INDEPENDENT_AMBULATORY_CARE_PROVIDER_SITE_OTHER)
Admission: RE | Admit: 2013-04-13 | Discharge: 2013-04-13 | Disposition: A | Discharge status: Home / Self Care | Payer: Commercial Managed Care - HMO | Source: Ambulatory Visit | Attending: Internal Medicine | Admitting: Internal Medicine

## 2013-04-13 ENCOUNTER — Ambulatory Visit (INDEPENDENT_AMBULATORY_CARE_PROVIDER_SITE_OTHER): Payer: Commercial Managed Care - HMO | Admitting: Internal Medicine

## 2013-04-13 ENCOUNTER — Other Ambulatory Visit (INDEPENDENT_AMBULATORY_CARE_PROVIDER_SITE_OTHER): Payer: Commercial Managed Care - HMO

## 2013-04-13 VITALS — BP 160/90 | HR 65 | Temp 98.2°F | Resp 14 | Ht 65.0 in | Wt 158.6 lb

## 2013-04-13 DIAGNOSIS — F17200 Nicotine dependence, unspecified, uncomplicated: Secondary | ICD-10-CM

## 2013-04-13 DIAGNOSIS — M899 Disorder of bone, unspecified: Secondary | ICD-10-CM

## 2013-04-13 DIAGNOSIS — M8580 Other specified disorders of bone density and structure, unspecified site: Secondary | ICD-10-CM

## 2013-04-13 DIAGNOSIS — Z86010 Personal history of colonic polyps: Secondary | ICD-10-CM

## 2013-04-13 DIAGNOSIS — E782 Mixed hyperlipidemia: Secondary | ICD-10-CM

## 2013-04-13 DIAGNOSIS — M949 Disorder of cartilage, unspecified: Secondary | ICD-10-CM

## 2013-04-13 DIAGNOSIS — R030 Elevated blood-pressure reading, without diagnosis of hypertension: Secondary | ICD-10-CM

## 2013-04-13 DIAGNOSIS — Z0000 Encounter for general adult medical examination without abnormal findings: Secondary | ICD-10-CM

## 2013-04-13 LAB — LIPID PANEL
CHOLESTEROL: 243 mg/dL — AB (ref 0–200)
HDL: 55.8 mg/dL (ref >39.00)
LDL Cholesterol: 171 mg/dL — ABNORMAL HIGH (ref 0–99)
Total CHOL/HDL Ratio: 4
Triglycerides: 83 mg/dL (ref 0.0–149.0)
VLDL: 16.6 mg/dL (ref 0.0–40.0)

## 2013-04-13 LAB — CBC WITH DIFFERENTIAL/PLATELET
BASOS ABS: 0 10*3/uL (ref 0.0–0.1)
Basophils Relative: 0.4 % (ref 0.0–3.0)
EOS PCT: 0.4 % (ref 0.0–5.0)
Eosinophils Absolute: 0 10*3/uL (ref 0.0–0.7)
HEMATOCRIT: 45.7 % (ref 36.0–46.0)
Hemoglobin: 15.4 g/dL — ABNORMAL HIGH (ref 12.0–15.0)
LYMPHS ABS: 2.2 10*3/uL (ref 0.7–4.0)
Lymphocytes Relative: 24.1 % (ref 12.0–46.0)
MCHC: 33.7 g/dL (ref 30.0–36.0)
MCV: 92.2 fl (ref 78.0–100.0)
MONO ABS: 0.4 10*3/uL (ref 0.1–1.0)
Monocytes Relative: 4.6 % (ref 3.0–12.0)
NEUTROS PCT: 70.5 % (ref 43.0–77.0)
Neutro Abs: 6.5 10*3/uL (ref 1.4–7.7)
PLATELETS: 223 10*3/uL (ref 150.0–400.0)
RBC: 4.95 Mil/uL (ref 3.87–5.11)
RDW: 13.4 % (ref 11.5–14.6)
WBC: 9.2 10*3/uL (ref 4.5–10.5)

## 2013-04-13 LAB — HEPATIC FUNCTION PANEL
ALT: 19 U/L (ref 0–35)
AST: 23 U/L (ref 0–37)
Albumin: 4.4 g/dL (ref 3.5–5.2)
Alkaline Phosphatase: 51 U/L (ref 39–117)
BILIRUBIN DIRECT: 0.1 mg/dL (ref 0.0–0.3)
Total Bilirubin: 0.7 mg/dL (ref 0.3–1.2)
Total Protein: 7.6 g/dL (ref 6.0–8.3)

## 2013-04-13 LAB — BASIC METABOLIC PANEL
BUN: 13 mg/dL (ref 6–23)
CO2: 27 mEq/L (ref 19–32)
CREATININE: 0.7 mg/dL (ref 0.4–1.2)
Calcium: 10.4 mg/dL (ref 8.4–10.5)
Chloride: 104 mEq/L (ref 96–112)
GFR: 85.61 mL/min (ref >60.00)
GLUCOSE: 103 mg/dL — AB (ref 70–99)
POTASSIUM: 4.4 meq/L (ref 3.5–5.1)
Sodium: 141 mEq/L (ref 135–145)

## 2013-04-13 LAB — TSH: TSH: 1.72 u[IU]/mL (ref 0.35–5.50)

## 2013-04-13 MED ORDER — HYDROCHLOROTHIAZIDE 12.5 MG PO CAPS: 12.5000 mg | ORAL_CAPSULE | Freq: Every day | ORAL | Status: DC

## 2013-04-13 NOTE — Patient Instructions (Addendum)
Your next office appointment will be determined based upon review of your pending labs & x-rays. Those instructions will be transmitted to you through My Chart  OR  by mail;whichever process is your choice to receive results .Minimal Blood Pressure Goal= AVERAGE < 140/90;  Ideal is an AVERAGE < 135/85. This AVERAGE should be calculated from @ least 5-7 BP readings taken @ different times of day on different days of week. You should not respond to isolated BP readings , but rather the AVERAGE for that week .Please bring your  blood pressure cuff to office visits to verify that it is reliable.It  can also be checked against the blood pressure device at the pharmacy. Finger or wrist cuffs are not dependable; an arm cuff is. Fill the  prescription for the BP medication if BP NOT @ goal based on  7 to 14 day average.

## 2013-04-13 NOTE — Progress Notes (Signed)
Pre visit review using our clinic review tool, if applicable. No additional management support is needed unless otherwise documented below in the visit note. 

## 2013-04-13 NOTE — Progress Notes (Signed)
Subjective:    Patient ID: Susan Holland, female    DOB: 21-May-1945, 67 y.o.   MRN: [id number]  HPI  Medicare Wellness Visit: Psychosocial and medical history were reviewed as required by Medicare (history related to abuse, antisocial behavior , firearm risk). Social history: Caffeine: up to 4 cups/ day, Alcohol: occasionally , Tobacco use:1/2 ppd Exercise:see below Personal safety/fall risk:no Limitations of activities of daily living:no Seatbelt/ smoke alarm use:yes Healthcare Power of Attorney/Living Will status: UTD Ophthalmologic exam status:pending Hearing evaluation status:not UTD Orientation: Oriented X 3 Memory and recall: good Math testing: good Depression/anxiety assessment: no Foreign travel history:never Immunization status for influenza/pneumonia/ shingles /tetanus:as per CMA Transfusion history:no Preventive health care maintenance status: Colonoscopy/BMD/mammogram/Pap as per protocol/standard care:BMD due,done @ Gyn Dental care:every 3 months Chart reviewed and updated. Active issues reviewed and addressed as documented below.    Review of Systems Blood pressure range / average 135-150/86-92 @ dentist or physician visits No anti hypertemsive medication. A modified heart healthy /low salt diet  followed. Exercise encompasses house work & yard work. Family history is positive  for HTN in sister & father.Her father had CVA @ 88. Significant headaches, epistaxis, chest pain, palpitations, exertional dyspnea, claudication, paroxysmal nocturnal dyspnea, or edema absent.     Objective:   Physical Exam Gen.: Healthy and well-nourished in appearance. Alert, appropriate and cooperative throughout exam. Head: Normocephalic without obvious abnormalities Eyes: No corneal or conjunctival inflammation noted. Pupils equal round reactive to light and accommodation. Extraocular motion intact. Fundal exam is benign without hemorrhages, exudate, papilledema.  Arteriolar  narrowing Ears: External  ear exam reveals no significant lesions or deformities. Canals clear .TMs normal. Hearing is grossly normal bilaterally. Nose: External nasal exam reveals no deformity or inflammation. Nasal mucosa are pink and moist. No lesions or exudates noted.   Mouth: Oral mucosa and oropharynx reveal no lesions or exudates. Teeth in good repair. Neck: No deformities, masses, or tenderness noted. Range of motion & Thyroid normal. Lungs: Normal respiratory effort; chest expands symmetrically. Lungs are clear to auscultation without rales, wheezes, or increased work of breathing. Heart: Normal rate and rhythm. Normal S1 and S2. No gallop, click, or rub. S4 w/o murmur. Abdomen: Bowel sounds normal; abdomen soft and nontender. No masses, organomegaly or hernias noted. Genitalia: as per Gyn                                  Musculoskeletal/extremities: No deformity or scoliosis noted of  the thoracic or lumbar spine.  No clubbing, cyanosis, edema, or significant extremity  deformity noted. Range of motion normal .Tone & strength normal. Hand joints normal  Fingernail  health good. Able to lie down & sit up w/o help. Negative SLR bilaterally Vascular: Carotid, radial artery, dorsalis pedis and  posterior tibial pulses are full and equal. No bruits present. Neurologic: Alert and oriented x3. Deep tendon reflexes symmetrical and normal.  Gait normal .    Skin: Intact without suspicious lesions or rashes. Lymph: No cervical, axillary lymphadenopathy present. Psych: Mood and affect are normal. Normally interactive  Assessment & Plan:  #1 Medicare Wellness Exam; criteria met ; data entered #2 Problem List/Diagnoses reviewed Plan:  Assessments made/ Orders entered  

## 2013-04-17 LAB — VITAMIN D 1,25 DIHYDROXY
Vitamin D 1, 25 (OH)2 Total: 79 pg/mL — ABNORMAL HIGH (ref 18–72)
Vitamin D2 1, 25 (OH)2: <8 pg/mL
Vitamin D3 1, 25 (OH)2: 79 pg/mL

## 2013-04-24 ENCOUNTER — Telehealth: Payer: Self-pay

## 2013-04-24 NOTE — Telephone Encounter (Addendum)
Patient states she went to see the eye doctor and concerning eye vessels (can not remember the term used) were seen that need to be checked. She has a surgery on that eye in the past.

## 2013-04-24 NOTE — Telephone Encounter (Signed)
Phone call from patient [phone] requesting a referral to Dr Chrystie Nose Matthews(eye doctor) and that office number is [phone]. Office is on [place]. Patient has an appt 05/08/13.

## 2013-04-24 NOTE — Telephone Encounter (Signed)
Insurance co will require  Diagnosis for referral; no dx on Problem List

## 2013-04-27 ENCOUNTER — Telehealth: Payer: Self-pay | Admitting: Internal Medicine

## 2013-04-27 ENCOUNTER — Telehealth: Payer: Self-pay

## 2013-04-27 NOTE — Telephone Encounter (Signed)
LMOM at Dr. Payton Emerald office to fax [place] notes.

## 2013-04-27 NOTE — Telephone Encounter (Signed)
PT has [employer].  She saw Dr. Herbert Deaner on March 20.  She has been referred to a retina specialist, Dr. Tempie Hoist.  She needs a [employer] referral.

## 2013-04-27 NOTE — Telephone Encounter (Signed)
   [employer] will require a diagnosis from Dr. Herbert Deaner; please have those records faxed so I can complete the referral. I have no records from her ophthalmologist in the chart. [employer] will not authorize a referral without a diagnosis on record.

## 2013-04-27 NOTE — Telephone Encounter (Signed)
Message copied by Shelly Coss on Mon Apr 27, 2013  8:45 AM ------      Message from: Hendricks Limes      Created: Sat Apr 25, 2013  6:17 PM       I have reviewed her entire EMR or surgery & find no Ophth conditions ; I need her to call her Ophth to verify diagnosis . Insurance company requires a  diagnosis to authorize referral ( they are Code /data obsessive !). Also verify past eye surgery so I can enter it into her record ------

## 2013-04-27 NOTE — Telephone Encounter (Signed)
Patient has been advised

## 2013-05-04 ENCOUNTER — Telehealth: Payer: Self-pay

## 2013-05-04 ENCOUNTER — Encounter: Payer: Self-pay | Admitting: Internal Medicine

## 2013-05-04 DIAGNOSIS — H40009 Preglaucoma, unspecified, unspecified eye: Secondary | ICD-10-CM | POA: Insufficient documentation

## 2013-05-04 NOTE — Telephone Encounter (Signed)
Message copied by Shelly Coss on Mon May 04, 2013  8:28 AM ------      Message from: Hendricks Limes      Created: Mon May 04, 2013  6:34 AM       [employer] required diagnoses to process referral to Dr Herbert Deaner.      Dx: glaucoma suspect, S/P cataract surgery based on record 04/24/13 ------

## 2013-05-08 ENCOUNTER — Encounter (INDEPENDENT_AMBULATORY_CARE_PROVIDER_SITE_OTHER): Payer: Commercial Managed Care - HMO | Admitting: Ophthalmology

## 2013-05-08 DIAGNOSIS — H33309 Unspecified retinal break, unspecified eye: Secondary | ICD-10-CM

## 2013-05-08 DIAGNOSIS — H35379 Puckering of macula, unspecified eye: Secondary | ICD-10-CM

## 2013-05-08 DIAGNOSIS — H43819 Vitreous degeneration, unspecified eye: Secondary | ICD-10-CM

## 2013-05-08 DIAGNOSIS — H2510 Age-related nuclear cataract, unspecified eye: Secondary | ICD-10-CM

## 2013-05-26 ENCOUNTER — Encounter (INDEPENDENT_AMBULATORY_CARE_PROVIDER_SITE_OTHER): Payer: Commercial Managed Care - HMO | Admitting: Ophthalmology

## 2013-05-26 DIAGNOSIS — H33309 Unspecified retinal break, unspecified eye: Secondary | ICD-10-CM

## 2013-09-28 ENCOUNTER — Ambulatory Visit (INDEPENDENT_AMBULATORY_CARE_PROVIDER_SITE_OTHER): Payer: Commercial Managed Care - HMO | Admitting: Ophthalmology

## 2013-09-28 DIAGNOSIS — H35379 Puckering of macula, unspecified eye: Secondary | ICD-10-CM

## 2013-09-28 DIAGNOSIS — H33309 Unspecified retinal break, unspecified eye: Secondary | ICD-10-CM

## 2013-09-28 DIAGNOSIS — H2510 Age-related nuclear cataract, unspecified eye: Secondary | ICD-10-CM

## 2013-09-28 DIAGNOSIS — H43819 Vitreous degeneration, unspecified eye: Secondary | ICD-10-CM

## 2013-11-02 ENCOUNTER — Telehealth: Payer: Self-pay | Admitting: Internal Medicine

## 2013-11-02 NOTE — Telephone Encounter (Signed)
Patient called requesting referral to see Dr. Herbert Deaner. Appt is 11/12/2013. Do not have dx code.

## 2013-11-04 NOTE — Telephone Encounter (Signed)
[employer] Josem Kaufmann #[id number] valid 11/12/2013 - 05/11/2014 for 6 visits

## 2014-02-05 HISTORY — PX: CATARACT EXTRACTION: SUR2

## 2014-03-08 ENCOUNTER — Telehealth: Payer: Self-pay | Admitting: Internal Medicine

## 2014-03-08 NOTE — Telephone Encounter (Signed)
Pt request referral for [employer] for OBGYN Dr. Marvel Plan 04/23/14 and eye doctor on 3/21/6 Dr. Herbert Deaner.

## 2014-03-11 NOTE — Telephone Encounter (Signed)
Dr Marvel Plan appt 04/23/14   Referral #[id number]  start date 04/23/14, exp 10/20/14 good for 6 visits.  Dr Herbert Deaner appt 04/26/14 Referral #[id number]  start date 04/26/14, exp 10/23/14  good for 6 visits.

## 2014-04-26 ENCOUNTER — Telehealth: Payer: Self-pay | Admitting: Internal Medicine

## 2014-04-26 NOTE — Telephone Encounter (Signed)
Is requesting [employer] Referral: [facility] - Dr. Karma Lew - work up for possible cataract surgery appt scheduled 05/10/14 at 8:15am

## 2014-04-27 NOTE — Telephone Encounter (Signed)
[employer] auth submitted. Awaiting approval. 

## 2014-05-05 NOTE — Telephone Encounter (Signed)
Auth # [id number] valid 05/10/14-11/06/14 for 6 visits

## 2014-05-17 ENCOUNTER — Other Ambulatory Visit: Payer: Self-pay | Admitting: Internal Medicine

## 2014-05-17 ENCOUNTER — Ambulatory Visit (INDEPENDENT_AMBULATORY_CARE_PROVIDER_SITE_OTHER): Payer: Commercial Managed Care - HMO | Admitting: Internal Medicine

## 2014-05-17 ENCOUNTER — Encounter: Payer: Self-pay | Admitting: Internal Medicine

## 2014-05-17 ENCOUNTER — Other Ambulatory Visit (INDEPENDENT_AMBULATORY_CARE_PROVIDER_SITE_OTHER): Payer: Commercial Managed Care - HMO

## 2014-05-17 VITALS — BP 190/110 | HR 66 | Temp 98.2°F | Ht 65.0 in | Wt 163.0 lb

## 2014-05-17 DIAGNOSIS — Z86010 Personal history of colonic polyps: Secondary | ICD-10-CM

## 2014-05-17 DIAGNOSIS — D485 Neoplasm of uncertain behavior of skin: Secondary | ICD-10-CM

## 2014-05-17 DIAGNOSIS — R9431 Abnormal electrocardiogram [ECG] [EKG]: Secondary | ICD-10-CM

## 2014-05-17 DIAGNOSIS — I152 Hypertension secondary to endocrine disorders: Secondary | ICD-10-CM | POA: Insufficient documentation

## 2014-05-17 DIAGNOSIS — C4490 Unspecified malignant neoplasm of skin, unspecified: Secondary | ICD-10-CM

## 2014-05-17 DIAGNOSIS — I10 Essential (primary) hypertension: Secondary | ICD-10-CM

## 2014-05-17 DIAGNOSIS — E782 Mixed hyperlipidemia: Secondary | ICD-10-CM | POA: Diagnosis not present

## 2014-05-17 DIAGNOSIS — F17200 Nicotine dependence, unspecified, uncomplicated: Secondary | ICD-10-CM

## 2014-05-17 DIAGNOSIS — M8580 Other specified disorders of bone density and structure, unspecified site: Secondary | ICD-10-CM

## 2014-05-17 DIAGNOSIS — Z720 Tobacco use: Secondary | ICD-10-CM | POA: Diagnosis not present

## 2014-05-17 LAB — CBC WITH DIFFERENTIAL/PLATELET
Basophils Absolute: 0.1 10*3/uL (ref 0.0–0.1)
Basophils Relative: 1 % (ref 0.0–3.0)
EOS PCT: 0.4 % (ref 0.0–5.0)
Eosinophils Absolute: 0 10*3/uL (ref 0.0–0.7)
HEMATOCRIT: 44.2 % (ref 36.0–46.0)
HEMOGLOBIN: 15.1 g/dL — AB (ref 12.0–15.0)
LYMPHS PCT: 18.4 % (ref 12.0–46.0)
Lymphs Abs: 2 10*3/uL (ref 0.7–4.0)
MCHC: 34.1 g/dL (ref 30.0–36.0)
MCV: 89.6 fl (ref 78.0–100.0)
MONOS PCT: 5 % (ref 3.0–12.0)
Monocytes Absolute: 0.5 10*3/uL (ref 0.1–1.0)
NEUTROS ABS: 8.1 10*3/uL — AB (ref 1.4–7.7)
Neutrophils Relative %: 75.2 % (ref 43.0–77.0)
Platelets: 205 10*3/uL (ref 150.0–400.0)
RBC: 4.93 Mil/uL (ref 3.87–5.11)
RDW: 13.5 % (ref 11.5–15.5)
WBC: 10.8 10*3/uL — ABNORMAL HIGH (ref 4.0–10.5)

## 2014-05-17 LAB — BASIC METABOLIC PANEL
BUN: 10 mg/dL (ref 6–23)
CALCIUM: 10.3 mg/dL (ref 8.4–10.5)
CO2: 29 meq/L (ref 19–32)
CREATININE: 0.64 mg/dL (ref 0.40–1.20)
Chloride: 103 mEq/L (ref 96–112)
GFR: 97.76 mL/min (ref >60.00)
Glucose, Bld: 98 mg/dL (ref 70–99)
Potassium: 4.5 mEq/L (ref 3.5–5.1)
SODIUM: 139 meq/L (ref 135–145)

## 2014-05-17 LAB — HEPATIC FUNCTION PANEL
ALT: 11 U/L (ref 0–35)
AST: 16 U/L (ref 0–37)
Albumin: 4.3 g/dL (ref 3.5–5.2)
Alkaline Phosphatase: 57 U/L (ref 39–117)
BILIRUBIN DIRECT: 0.1 mg/dL (ref 0.0–0.3)
BILIRUBIN TOTAL: 0.4 mg/dL (ref 0.2–1.2)
Total Protein: 7 g/dL (ref 6.0–8.3)

## 2014-05-17 LAB — TSH: TSH: 1.82 u[IU]/mL (ref 0.35–4.50)

## 2014-05-17 LAB — VITAMIN D 25 HYDROXY (VIT D DEFICIENCY, FRACTURES): VITD: 44.08 ng/mL (ref 30.00–100.00)

## 2014-05-17 MED ORDER — LOSARTAN POTASSIUM-HCTZ 100-12.5 MG PO TABS: 1.0000 | ORAL_TABLET | Freq: Every day | ORAL | Status: DC

## 2014-05-17 NOTE — Progress Notes (Signed)
Pre visit review using our clinic review tool, if applicable. No additional management support is needed unless otherwise documented below in the visit note. 

## 2014-05-17 NOTE — Assessment & Plan Note (Signed)
Vitamin D level 

## 2014-05-17 NOTE — Assessment & Plan Note (Signed)
CBC

## 2014-05-17 NOTE — Progress Notes (Signed)
Subjective:    Patient ID: Susan Holland, female    DOB: 06/12/1945, 69 y.o.   MRN: [id number]  HPI The patient is here to assess status of active health conditions.  PMH, FH, & Social History reviewed & updated.  She was seen by Dr. Wallace Keller on 05/10/14. Cataract surgery scheduled on OD 5/3 and on OS 5/17. PMH of radial keratotomy .  She did not take HCTZ. She also has not taken the prescribed statin.  Blood pressures are home are described as 123-140/70 83. She is on a modified heart healthy, low-salt diet. She works out @ the gym 3 times a week using machines for 60 minutes without associated cardiopulmonary symptoms.  She smoked from age 34 to the present up to less than a half pack a day. She's presently smokes 5-8 cigarettes per day.  Family history is positive for stroke in her father at 53 and mother at 39. There is no premature heart disease.  Colonoscopy was completed 03/2011 and revealed hyperplastic polyps. She has no active GI symptoms    Review of Systems  Chest pain, palpitations, tachycardia, exertional dyspnea, paroxysmal nocturnal dyspnea, claudication or edema are absent.  Unexplained weight loss, abdominal pain, significant dyspepsia, dysphagia, melena, rectal bleeding, or persistently small caliber stools are denied.  She has a skin lesion over the right upper chest which been present for 6 months. She's been using topical agents. She does believe it is decreasing in size. She has seen Dr. Allyson Sabal for skin cancer.    Objective:   Physical Exam Pertinent or positive findings include: Repeat blood pressure was 180/120.  Arteriolar narrowing is present.  She has very short grade 1 systolic murmur at the base.  She has a 5 x 8 mm erythematous papular lesion of the right upper chest.  Gen.: Adequately nourished in appearance. Alert, appropriate and cooperative throughout exam. Appears younger than stated age  Head: Normocephalic without obvious  abnormalities  Eyes: No corneal or conjunctival inflammation noted. Pupils equal round reactive to light and accommodation. Extraocular motion intact.  Ears: External  ear exam reveals no significant lesions or deformities. Canals clear .TMs normal. Hearing is grossly normal bilaterally. Nose: External nasal exam reveals no deformity or inflammation. Nasal mucosa are pink and moist. No lesions or exudates noted.   Mouth: Oral mucosa and oropharynx reveal no lesions or exudates. Teeth in good repair. Neck: No deformities, masses, or tenderness noted. Range of motion & Thyroid normal Lungs: Normal respiratory effort; chest expands symmetrically. Lungs are clear to auscultation without rales, wheezes, or increased work of breathing. Heart: Normal rate and rhythm. Normal S1 and S2. No gallop, click, or rub. No murmur. Abdomen: Bowel sounds normal; abdomen soft and nontender. No masses, organomegaly or hernias noted. Genitalia: as per Gyn                                  Musculoskeletal/extremities: No deformity or scoliosis noted of  the thoracic or lumbar spine.  No clubbing, cyanosis, edema, or significant extremity  deformity noted.  Range of motion normal . Tone & strength normal. Hand joints normal  Fingernail  health good. Able to lie down & sit up w/o help.  Negative SLR bilaterally Vascular: Carotid, radial artery, dorsalis pedis and  posterior tibial pulses are full and equal. No bruits present. Neurologic: Alert and oriented x3. Deep tendon reflexes symmetrical and normal.  Gait normal  Skin: Intact without suspicious lesions or rashes. Lymph: No cervical, axillary lymphadenopathy present. Psych: Mood and affect are normal. Normally interactive                                                                                      Assessment & Plan:  See Current Assessment & Plan in Problem List under specific Diagnosis.  EKG meets criteria for LVH in context of hypertension.  Junctional rhythm suggested by P wave morphology

## 2014-05-17 NOTE — Patient Instructions (Addendum)
Minimal Blood Pressure Goal= AVERAGE < 140/90;  Ideal is an AVERAGE < 135/85. This AVERAGE should be calculated from @ least 5-7 BP readings taken @ different times of day on different days of week. You should not respond to isolated BP readings , but rather the AVERAGE for that week .Please bring your  blood pressure cuff to office visits to verify that it is reliable.It  can also be checked against the blood pressure device at the pharmacy. Finger or wrist cuffs are not dependable; an arm cuff is.   Your next office appointment will be determined based upon review of your pending labs 7/or xrays  Those instructions will be transmitted to you by My Chart  Critical results will be called.   Followup as needed for any active or acute issue. Please report any significant change in your symptoms.

## 2014-05-17 NOTE — Assessment & Plan Note (Signed)
Risk discussed 

## 2014-05-17 NOTE — Assessment & Plan Note (Signed)
Blood pressure goals reviewed. BMET EKG

## 2014-05-17 NOTE — Assessment & Plan Note (Signed)
NMR Lipoprofile, LFT, TSH

## 2014-05-18 DIAGNOSIS — R9431 Abnormal electrocardiogram [ECG] [EKG]: Secondary | ICD-10-CM | POA: Insufficient documentation

## 2014-05-18 NOTE — Assessment & Plan Note (Signed)
New lesion RU chest warrants assessment

## 2014-05-21 LAB — NMR LIPOPROFILE WITH LIPIDS
Cholesterol, Total: 234 mg/dL — ABNORMAL HIGH (ref 100–199)
HDL PARTICLE NUMBER: 29 umol/L — AB (ref >=30.5)
HDL SIZE: 9.1 nm — AB (ref >=9.2)
HDL-C: 63 mg/dL (ref >39)
LARGE HDL: 6.3 umol/L (ref >=4.8)
LDL CALC: 155 mg/dL — AB (ref 0–99)
LDL Particle Number: 1955 nmol/L — ABNORMAL HIGH (ref <1000)
LDL Size: 21.1 nm (ref >=20.8)
LP-IR Score: <25 (ref <=45)
Large VLDL-P: <0.8 nmol/L (ref <=2.7)
Small LDL Particle Number: 432 nmol/L (ref <=527)
TRIGLYCERIDES: 78 mg/dL (ref 0–149)
VLDL Size: 32.2 nm (ref <=46.6)

## 2014-05-25 ENCOUNTER — Encounter: Payer: Self-pay | Admitting: Internal Medicine

## 2014-05-26 ENCOUNTER — Telehealth: Payer: Self-pay | Admitting: Internal Medicine

## 2014-05-26 NOTE — Telephone Encounter (Signed)
disregard

## 2014-05-31 ENCOUNTER — Encounter: Payer: Self-pay | Admitting: Internal Medicine

## 2014-05-31 ENCOUNTER — Ambulatory Visit (INDEPENDENT_AMBULATORY_CARE_PROVIDER_SITE_OTHER): Payer: Commercial Managed Care - HMO | Admitting: Internal Medicine

## 2014-05-31 VITALS — BP 128/90 | HR 67 | Temp 98.1°F | Resp 16 | Ht 65.0 in | Wt 161.5 lb

## 2014-05-31 DIAGNOSIS — Z720 Tobacco use: Secondary | ICD-10-CM

## 2014-05-31 DIAGNOSIS — E782 Mixed hyperlipidemia: Secondary | ICD-10-CM | POA: Diagnosis not present

## 2014-05-31 DIAGNOSIS — F17200 Nicotine dependence, unspecified, uncomplicated: Secondary | ICD-10-CM

## 2014-05-31 DIAGNOSIS — I10 Essential (primary) hypertension: Secondary | ICD-10-CM | POA: Diagnosis not present

## 2014-05-31 MED ORDER — ATORVASTATIN CALCIUM 20 MG PO TABS: 20.0000 mg | ORAL_TABLET | Freq: Every day | ORAL | Status: DC

## 2014-05-31 NOTE — Progress Notes (Signed)
   Subjective:    Patient ID: Susan Holland, female    DOB: 16-Jan-1946, 69 y.o.   MRN: [id number]  HPI She has been compliant with the blood pressure medicines without adverse effects. Her blood pressure has ranged 119/67-149/94. The trend has been one of progressive decrease.  She is not restricting  red meats in the diet. She continues to exercise 2-3 times per week for 90 minutes without symptoms.  The advanced cholesterol testing was reviewed. Her LDL is 155; it should be less than 105, ideally less than 75. She had been prescribed pravastatin but had not elected to take the statin. At this time her risk would be increased 50% based on the LDL of 155 and a doubling of risk of heart attack or stroke related to smoking 5-8 cigarettes per day.This was discussed with her.  At her request all labs were reviewed in detail and diagrams concerning the NMR Lipoprofile provided.    Review of Systems   Chest pain, palpitations, tachycardia, exertional dyspnea, paroxysmal nocturnal dyspnea, claudication or edema are absent.       Objective:   Physical Exam  Appears healthy and well-nourished & in no acute distress  No carotid bruits are present.No neck vein distention present at 10 - 15 degrees. Thyroid normal to palpation  Heart rhythm and rate are normal with no gallop or murmur  Chest is clear with no increased work of breathing  There is no evidence of aortic aneurysm or renal artery bruits  Abdomen soft with no organomegaly or masses. No HJR  No clubbing, cyanosis or edema present.  Pedal pulses are intact   No ischemic skin changes are present . Fingernails healthy   Alert and oriented. Strength, tone, DTRs reflexes normal        Assessment & Plan:  See Current Assessment & Plan in Problem List under specific Diagnosis

## 2014-05-31 NOTE — Progress Notes (Signed)
Pre visit review using our clinic review tool, if applicable. No additional management support is needed unless otherwise documented below in the visit note. 

## 2014-05-31 NOTE — Assessment & Plan Note (Signed)
Please think about quitting smoking. Review the risks we discussed. Please call 1-800-QUIT-NOW (1-800-784-8669) for free smoking cessation counseling. There are multiple options are to help you stop smoking. These include nicotine patches, nicotine gum, and the new "E cigarette".  .  

## 2014-05-31 NOTE — Assessment & Plan Note (Addendum)
Atorvastatin & TLC ( nutrition & exercise) recommended Fasting labs after 10 weeks if she takes the statin

## 2014-05-31 NOTE — Assessment & Plan Note (Signed)
Blood pressure goals reviewed. No change in medication

## 2014-05-31 NOTE — Patient Instructions (Addendum)
Risk of premature heart attack or stroke increases as LDL or BAD cholesterol rises, especially if there is family history of heart attack in males before 79 or women before 24. Based on your advanced testing, your LDL goal is < 105 , ideally < 75. Your present LDL increases long term heart attack or stroke risk 50 %.  Please follow a Mediaterranean type diet  (many good cook books readily available) or review Dr Nunzio Cory book Eat, [place] for best  dietary cholesterol information & options.  Continue excellent cardiovascular exercise program 30-45 minutes 3-4 times per week.  Minimal Blood Pressure Goal= AVERAGE < 140/90;  Ideal is an AVERAGE < 135/85. This AVERAGE should be calculated from @ least 5-7 BP readings taken @ different times of day on different days of week. You should not respond to isolated BP readings , but rather the AVERAGE for that week .Please bring your  blood pressure cuff to office visits to verify that it is reliable.It  can also be checked against the blood pressure device at the pharmacy. Finger or wrist cuffs are not dependable; an arm cuff is.  Please think about quitting smoking. Review the risks we discussed. Please call 1-800-QUIT-NOW 306-710-1102) for free smoking cessation counseling. There are multiple options are to help you stop smoking. These include nicotine patches, nicotine gum, and the new "E cigarette".

## 2014-06-10 ENCOUNTER — Telehealth: Payer: Self-pay | Admitting: Internal Medicine

## 2014-06-10 DIAGNOSIS — I10 Essential (primary) hypertension: Secondary | ICD-10-CM

## 2014-06-10 DIAGNOSIS — E782 Mixed hyperlipidemia: Secondary | ICD-10-CM

## 2014-06-10 MED ORDER — ATORVASTATIN CALCIUM 20 MG PO TABS: 20.0000 mg | ORAL_TABLET | Freq: Every day | ORAL | Status: DC

## 2014-06-10 MED ORDER — LOSARTAN POTASSIUM-HCTZ 100-12.5 MG PO TABS: 1.0000 | ORAL_TABLET | Freq: Every day | ORAL | Status: DC

## 2014-06-10 NOTE — Telephone Encounter (Signed)
Patient requesting 90 day refills for losartan-hydrochlorothiazide (HYZAAR) 100-12.5 MG per tablet [[id number]] and atorvastatin (LIPITOR) 20 MG tablet [[id number]. Pharmacy is [facility]

## 2014-06-10 NOTE — Telephone Encounter (Signed)
Done

## 2014-06-17 ENCOUNTER — Other Ambulatory Visit: Payer: Self-pay | Admitting: Dermatology

## 2014-08-02 ENCOUNTER — Other Ambulatory Visit: Payer: Self-pay

## 2014-09-29 ENCOUNTER — Other Ambulatory Visit: Payer: Self-pay | Admitting: Internal Medicine

## 2014-09-29 ENCOUNTER — Encounter: Payer: Self-pay | Admitting: Internal Medicine

## 2014-09-29 ENCOUNTER — Telehealth: Payer: Self-pay | Admitting: Internal Medicine

## 2014-09-29 DIAGNOSIS — H40009 Preglaucoma, unspecified, unspecified eye: Secondary | ICD-10-CM

## 2014-09-29 NOTE — Telephone Encounter (Addendum)
Patient need referral to regular eye Dr Herbert Deaner 21st sept,  Dr Rodena Piety Aug 30 th Retina Doctor, Patient would like to know if she need to make and appointmentt for the pcv 13 vaccine.   please advise

## 2014-09-29 NOTE — Telephone Encounter (Signed)
Current referrals are being placed.

## 2014-09-29 NOTE — Telephone Encounter (Signed)
Please advise on referrals. 

## 2014-09-29 NOTE — Telephone Encounter (Signed)
What is dx for Dr Zigmund Daniel; insurance requires this for specialty referral? Other 2 OK

## 2014-10-05 ENCOUNTER — Ambulatory Visit (INDEPENDENT_AMBULATORY_CARE_PROVIDER_SITE_OTHER): Payer: Commercial Managed Care - HMO | Admitting: Ophthalmology

## 2014-10-05 ENCOUNTER — Ambulatory Visit (INDEPENDENT_AMBULATORY_CARE_PROVIDER_SITE_OTHER): Payer: Commercial Managed Care - HMO

## 2014-10-05 ENCOUNTER — Ambulatory Visit (INDEPENDENT_AMBULATORY_CARE_PROVIDER_SITE_OTHER)
Admission: RE | Admit: 2014-10-05 | Discharge: 2014-10-05 | Disposition: A | Discharge status: Home / Self Care | Payer: Commercial Managed Care - HMO | Source: Ambulatory Visit

## 2014-10-05 ENCOUNTER — Telehealth: Payer: Self-pay

## 2014-10-05 DIAGNOSIS — Z23 Encounter for immunization: Secondary | ICD-10-CM

## 2014-10-05 DIAGNOSIS — E2839 Other primary ovarian failure: Secondary | ICD-10-CM

## 2014-10-05 NOTE — Telephone Encounter (Signed)
Mammogram done 2016 with [facility] obgyn

## 2014-10-19 ENCOUNTER — Other Ambulatory Visit: Payer: Self-pay | Admitting: Internal Medicine

## 2014-10-20 ENCOUNTER — Ambulatory Visit (INDEPENDENT_AMBULATORY_CARE_PROVIDER_SITE_OTHER): Payer: Medicare HMO | Admitting: Ophthalmology

## 2014-10-20 DIAGNOSIS — H35373 Puckering of macula, bilateral: Secondary | ICD-10-CM | POA: Diagnosis not present

## 2014-10-20 DIAGNOSIS — H43813 Vitreous degeneration, bilateral: Secondary | ICD-10-CM

## 2015-02-08 DIAGNOSIS — Z85828 Personal history of other malignant neoplasm of skin: Secondary | ICD-10-CM | POA: Diagnosis not present

## 2015-02-08 DIAGNOSIS — L720 Epidermal cyst: Secondary | ICD-10-CM | POA: Diagnosis not present

## 2015-02-28 DIAGNOSIS — H26491 Other secondary cataract, right eye: Secondary | ICD-10-CM | POA: Diagnosis not present

## 2015-02-28 DIAGNOSIS — H26492 Other secondary cataract, left eye: Secondary | ICD-10-CM | POA: Diagnosis not present

## 2015-02-28 DIAGNOSIS — H40013 Open angle with borderline findings, low risk, bilateral: Secondary | ICD-10-CM | POA: Diagnosis not present

## 2015-05-19 ENCOUNTER — Other Ambulatory Visit (INDEPENDENT_AMBULATORY_CARE_PROVIDER_SITE_OTHER): Payer: PPO

## 2015-05-19 ENCOUNTER — Encounter: Payer: Self-pay | Admitting: Internal Medicine

## 2015-05-19 ENCOUNTER — Ambulatory Visit (INDEPENDENT_AMBULATORY_CARE_PROVIDER_SITE_OTHER): Payer: PPO | Admitting: Internal Medicine

## 2015-05-19 VITALS — BP 126/84 | HR 58 | Temp 98.2°F | Resp 16 | Wt 145.0 lb

## 2015-05-19 DIAGNOSIS — Z0000 Encounter for general adult medical examination without abnormal findings: Secondary | ICD-10-CM | POA: Diagnosis not present

## 2015-05-19 DIAGNOSIS — I10 Essential (primary) hypertension: Secondary | ICD-10-CM | POA: Diagnosis not present

## 2015-05-19 DIAGNOSIS — M8580 Other specified disorders of bone density and structure, unspecified site: Secondary | ICD-10-CM | POA: Diagnosis not present

## 2015-05-19 DIAGNOSIS — E782 Mixed hyperlipidemia: Secondary | ICD-10-CM | POA: Diagnosis not present

## 2015-05-19 LAB — CBC WITH DIFFERENTIAL/PLATELET
BASOS PCT: 0.5 % (ref 0.0–3.0)
Basophils Absolute: 0 10*3/uL (ref 0.0–0.1)
Eosinophils Absolute: 0.1 10*3/uL (ref 0.0–0.7)
Eosinophils Relative: 0.6 % (ref 0.0–5.0)
HEMATOCRIT: 40.6 % (ref 36.0–46.0)
Hemoglobin: 13.8 g/dL (ref 12.0–15.0)
LYMPHS ABS: 2 10*3/uL (ref 0.7–4.0)
LYMPHS PCT: 20.8 % (ref 12.0–46.0)
MCHC: 33.9 g/dL (ref 30.0–36.0)
MCV: 90.6 fl (ref 78.0–100.0)
MONOS PCT: 8.3 % (ref 3.0–12.0)
Monocytes Absolute: 0.8 10*3/uL (ref 0.1–1.0)
NEUTROS ABS: 6.8 10*3/uL (ref 1.4–7.7)
Neutrophils Relative %: 69.8 % (ref 43.0–77.0)
PLATELETS: 207 10*3/uL (ref 150.0–400.0)
RBC: 4.48 Mil/uL (ref 3.87–5.11)
RDW: 13.3 % (ref 11.5–15.5)
WBC: 9.7 10*3/uL (ref 4.0–10.5)

## 2015-05-19 LAB — COMPREHENSIVE METABOLIC PANEL
ALT: 13 U/L (ref 0–35)
AST: 18 U/L (ref 0–37)
Albumin: 4.1 g/dL (ref 3.5–5.2)
Alkaline Phosphatase: 48 U/L (ref 39–117)
BUN: 9 mg/dL (ref 6–23)
CALCIUM: 9.6 mg/dL (ref 8.4–10.5)
CHLORIDE: 99 meq/L (ref 96–112)
CO2: 28 meq/L (ref 19–32)
Creatinine, Ser: 0.61 mg/dL (ref 0.40–1.20)
GFR: 103.02 mL/min (ref >60.00)
Glucose, Bld: 95 mg/dL (ref 70–99)
POTASSIUM: 3.8 meq/L (ref 3.5–5.1)
Sodium: 134 mEq/L — ABNORMAL LOW (ref 135–145)
Total Bilirubin: 0.7 mg/dL (ref 0.2–1.2)
Total Protein: 6.7 g/dL (ref 6.0–8.3)

## 2015-05-19 LAB — LIPID PANEL
CHOL/HDL RATIO: 2
Cholesterol: 131 mg/dL (ref 0–200)
HDL: 59.7 mg/dL (ref >39.00)
LDL CALC: 59 mg/dL (ref 0–99)
NonHDL: 70.84
TRIGLYCERIDES: 58 mg/dL (ref 0.0–149.0)
VLDL: 11.6 mg/dL (ref 0.0–40.0)

## 2015-05-19 LAB — TSH: TSH: 1.69 u[IU]/mL (ref 0.35–4.50)

## 2015-05-19 NOTE — Assessment & Plan Note (Signed)
dexa up to date Encouraged regular exercise Taking calcium and vitamin d

## 2015-05-19 NOTE — Progress Notes (Signed)
Pre visit review using our clinic review tool, if applicable. No additional management support is needed unless otherwise documented below in the visit note. 

## 2015-05-19 NOTE — Assessment & Plan Note (Signed)
Check lipid panel, cmp Increase exercise

## 2015-05-19 NOTE — Progress Notes (Signed)
Subjective:    Patient ID: Susan Holland, female    DOB: 1945/06/10, 70 y.o.   MRN: [id number]  HPI She is here for a physical exam.   Hypertension: She is taking her medication daily. She is compliant with a low sodium diet.  She denies chest pain, palpitations, edema, shortness of breath and regular headaches. She is exercising regularly.  She does monitor her blood pressure at home, and it has been controlled.    Hyperlipidemia: She is taking her medication daily. She is compliant with a low fat/cholesterol diet. She is exercising regularly. She denies myalgias.   She has no concerns.   Medications and allergies reviewed with patient and updated if appropriate.  Patient Active Problem List   Diagnosis Date Noted  . Nonspecific abnormal electrocardiogram (ECG) (EKG) 05/18/2014  . Essential hypertension 05/17/2014  . Glaucoma suspect 05/04/2013  . Smoker 04/13/2013  . Skin cancer 03/19/2011  . Osteopenia 03/19/2011  . Abnormal Pap smear 03/19/2011  . History of colonic polyps 03/17/2010  . HYPERLIPIDEMIA 03/06/2007    Current Outpatient Prescriptions on File Prior to Visit  Medication Sig Dispense Refill  . aspirin 81 MG tablet Take 160 mg by mouth daily.    Marland Kitchen atorvastatin (LIPITOR) 20 MG tablet TAKE 1 TABLET EVERY DAY 90 tablet 1  . Calcium Carbonate-Vitamin D (CALCIUM + D PO) Take by mouth daily. Take 2 600 mg daily    . FIBER COMPLETE PO Take by mouth 2 (two) times daily.    . fish oil-omega-3 fatty acids 1000 MG capsule Take 2 g by mouth daily. Take 2 1000 mg daily    . losartan-hydrochlorothiazide (HYZAAR) 100-12.5 MG per tablet TAKE 1 TABLET BY MOUTH DAILY. 90 tablet 1  . Multiple Vitamin (MULTIVITAMIN) tablet Take 1 tablet by mouth daily.     No current facility-administered medications on file prior to visit.    Past Medical History  Diagnosis Date  . Diverticulosis   . Colon polyp   . Hyperlipidemia   . GERD (gastroesophageal reflux disease)   .  Glaucoma suspect     Past Surgical History  Procedure Laterality Date  . Colonoscopy w/ polypectomy  03/2011    repeat 2020; Dr Olevia Perches  . Tubal ligation    . Tonsillectomy and adenoidectomy    . Deep leg / ankle tumor excision       X 2 RLE ; benign    Social History   Social History  . Marital Status: Married    Spouse Name: N/A  . Number of Children: N/A  . Years of Education: N/A   Social History Main Topics  . Smoking status: Current Some Day Smoker    Types: Cigarettes  . Smokeless tobacco: Never Used     Comment: smoked 1963- present, up to 1/2 ppd  . Alcohol Use: Yes     Comment:  occasionally   . Drug Use: No  . Sexual Activity: Not Asked   Other Topics Concern  . None   Social History Narrative    Family History  Problem Relation Age of Onset  . Dementia Mother     Alsheimer's  . Stroke Father 40  . Heart disease Sister     AF  . Colon cancer Maternal Uncle     X 2  . Diabetes Neg Hx     Review of Systems  Constitutional: Negative for fever, chills, appetite change and fatigue.  HENT: Negative for hearing loss.   Eyes: Negative  for visual disturbance.  Respiratory: Negative for cough, shortness of breath and wheezing.   Cardiovascular: Negative for chest pain, palpitations and leg swelling.  Gastrointestinal: Positive for constipation. Negative for nausea, abdominal pain, diarrhea and blood in stool.       No gerd  Endocrine: Negative for polydipsia and polyuria.  Genitourinary: Negative for dysuria and hematuria.  Musculoskeletal: Negative for myalgias, back pain and arthralgias.  Skin: Negative for color change and rash.  Neurological: Negative for dizziness, light-headedness and headaches.  Psychiatric/Behavioral: Negative for dysphoric mood. The patient is not nervous/anxious.        No memory concerns       Objective:   Filed Vitals:   05/19/15 0835  BP: 126/84  Pulse: 58  Temp: 98.2 F (36.8 C)  Resp: 16   Filed Weights    05/19/15 0835  Weight: 145 lb (65.772 kg)   Body mass index is 24.13 kg/(m^2).   Physical Exam Constitutional: She appears well-developed and well-nourished. No distress.  HENT:  Head: Normocephalic and atraumatic.  Right Ear: External ear normal. Normal ear canal and TM Left Ear: External ear normal.  Normal ear canal and TM Mouth/Throat: Oropharynx is clear and moist.  Eyes: Conjunctivae and EOM are normal.  Neck: Neck supple. No tracheal deviation present. No thyromegaly present.  No carotid bruit  Cardiovascular: Normal rate, regular rhythm and normal heart sounds.   No murmur heard.  No edema. Pulmonary/Chest: Effort normal and breath sounds normal. No respiratory distress. She has no wheezes. She has no rales.  Breast: deferred to Gyn Abdominal: Soft. She exhibits no distension. There is no tenderness.  Lymphadenopathy: She has no cervical adenopathy.  Skin: Skin is warm and dry. She is not diaphoretic.  Psychiatric: She has a normal mood and affect. Her behavior is normal.         Assessment & Plan:   Physical exam: Screening blood work ordered Immunizations up to date Colonoscopy  Up to date Mammogram  Up to date Gyn  Up to date Dexa  Up to date Eye exams  Up to date EKG  Up to date Exercise - not exercising regularly - stressed regular exercise Weight - BMI normal Skin  - sees derm annually Substance abuse - none  See Problem List for Assessment and Plan of chronic medical problems.   F/u annually

## 2015-05-19 NOTE — Assessment & Plan Note (Signed)
BP well controlled Current regimen effective and well tolerated Continue current medications at current doses Check cmp 

## 2015-05-19 NOTE — Patient Instructions (Addendum)
Test(s) ordered today. Your results will be released to Leon (or called to you) after review, usually within 72hours after test completion. If any changes need to be made, you will be notified at that same time.  All other Health Maintenance issues reviewed.   All recommended immunizations and age-appropriate screenings are up-to-date or discussed.  No immunizations administered today.   Medications reviewed and updated.  No changes recommended at this time.   Please followup in annually  Health Maintenance, Female Adopting a healthy lifestyle and getting preventive care can go a long way to promote health and wellness. Talk with your health care provider about what schedule of regular examinations is right for you. This is a good chance for you to check in with your provider about disease prevention and staying healthy. In between checkups, there are plenty of things you can do on your own. Experts have done a lot of research about which lifestyle changes and preventive measures are most likely to keep you healthy. Ask your health care provider for more information. WEIGHT AND DIET  Eat a healthy diet  Be sure to include plenty of vegetables, fruits, low-fat dairy products, and lean protein.  Do not eat a lot of foods high in solid fats, added sugars, or salt.  Get regular exercise. This is one of the most important things you can do for your health.  Most adults should exercise for at least 150 minutes each week. The exercise should increase your heart rate and make you sweat (moderate-intensity exercise).  Most adults should also do strengthening exercises at least twice a week. This is in addition to the moderate-intensity exercise.  Maintain a healthy weight  Body mass index (BMI) is a measurement that can be used to identify possible weight problems. It estimates body fat based on height and weight. Your health care provider can help determine your BMI and help you achieve or  maintain a healthy weight.  For females 81 years of age and older:   A BMI below 18.5 is considered underweight.  A BMI of 18.5 to 24.9 is normal.  A BMI of 25 to 29.9 is considered overweight.  A BMI of 30 and above is considered obese.  Watch levels of cholesterol and blood lipids  You should start having your blood tested for lipids and cholesterol at 70 years of age, then have this test every 5 years.  You may need to have your cholesterol levels checked more often if:  Your lipid or cholesterol levels are high.  You are older than 70 years of age.  You are at high risk for heart disease.  CANCER SCREENING   Lung Cancer  Lung cancer screening is recommended for adults 76-41 years old who are at high risk for lung cancer because of a history of smoking.  A yearly low-dose CT scan of the lungs is recommended for people who:  Currently smoke.  Have quit within the past 15 years.  Have at least a 30-pack-year history of smoking. A pack year is smoking an average of one pack of cigarettes a day for 1 year.  Yearly screening should continue until it has been 15 years since you quit.  Yearly screening should stop if you develop a health problem that would prevent you from having lung cancer treatment.  Breast Cancer  Practice breast self-awareness. This means understanding how your breasts normally appear and feel.  It also means doing regular breast self-exams. Let your health care provider know about  any changes, no matter how small.  If you are in your 20s or 30s, you should have a clinical breast exam (CBE) by a health care provider every 1-3 years as part of a regular health exam.  If you are 83 or older, have a CBE every year. Also consider having a breast X-ray (mammogram) every year.  If you have a family history of breast cancer, talk to your health care provider about genetic screening.  If you are at high risk for breast cancer, talk to your health care  provider about having an MRI and a mammogram every year.  Breast cancer gene (BRCA) assessment is recommended for women who have family members with BRCA-related cancers. BRCA-related cancers include:  Breast.  Ovarian.  Tubal.  Peritoneal cancers.  Results of the assessment will determine the need for genetic counseling and BRCA1 and BRCA2 testing. Cervical Cancer Your health care provider may recommend that you be screened regularly for cancer of the pelvic organs (ovaries, uterus, and vagina). This screening involves a pelvic examination, including checking for microscopic changes to the surface of your cervix (Pap test). You may be encouraged to have this screening done every 3 years, beginning at age 58.  For women ages 31-65, health care providers may recommend pelvic exams and Pap testing every 3 years, or they may recommend the Pap and pelvic exam, combined with testing for human papilloma virus (HPV), every 5 years. Some types of HPV increase your risk of cervical cancer. Testing for HPV may also be done on women of any age with unclear Pap test results.  Other health care providers may not recommend any screening for nonpregnant women who are considered low risk for pelvic cancer and who do not have symptoms. Ask your health care provider if a screening pelvic exam is right for you.  If you have had past treatment for cervical cancer or a condition that could lead to cancer, you need Pap tests and screening for cancer for at least 20 years after your treatment. If Pap tests have been discontinued, your risk factors (such as having a new sexual partner) need to be reassessed to determine if screening should resume. Some women have medical problems that increase the chance of getting cervical cancer. In these cases, your health care provider may recommend more frequent screening and Pap tests. Colorectal Cancer  This type of cancer can be detected and often prevented.  Routine  colorectal cancer screening usually begins at 70 years of age and continues through 70 years of age.  Your health care provider may recommend screening at an earlier age if you have risk factors for colon cancer.  Your health care provider may also recommend using home test kits to check for hidden blood in the stool.  A small camera at the end of a tube can be used to examine your colon directly (sigmoidoscopy or colonoscopy). This is done to check for the earliest forms of colorectal cancer.  Routine screening usually begins at age 13.  Direct examination of the colon should be repeated every 5-10 years through 70 years of age. However, you may need to be screened more often if early forms of precancerous polyps or small growths are found. Skin Cancer  Check your skin from head to toe regularly.  Tell your health care provider about any new moles or changes in moles, especially if there is a change in a mole's shape or color.  Also tell your health care provider if you have  a mole that is larger than the size of a pencil eraser.  Always use sunscreen. Apply sunscreen liberally and repeatedly throughout the day.  Protect yourself by wearing long sleeves, pants, a wide-brimmed hat, and sunglasses whenever you are outside. HEART DISEASE, DIABETES, AND HIGH BLOOD PRESSURE   High blood pressure causes heart disease and increases the risk of stroke. High blood pressure is more likely to develop in:  People who have blood pressure in the high end of the normal range (130-139/85-89 mm Hg).  People who are overweight or obese.  People who are African American.  If you are 41-66 years of age, have your blood pressure checked every 3-5 years. If you are 37 years of age or older, have your blood pressure checked every year. You should have your blood pressure measured twice--once when you are at a hospital or clinic, and once when you are not at a hospital or clinic. Record the average of the  two measurements. To check your blood pressure when you are not at a hospital or clinic, you can use:  An automated blood pressure machine at a pharmacy.  A home blood pressure monitor.  If you are between 33 years and 31 years old, ask your health care provider if you should take aspirin to prevent strokes.  Have regular diabetes screenings. This involves taking a blood sample to check your fasting blood sugar level.  If you are at a normal weight and have a low risk for diabetes, have this test once every three years after 70 years of age.  If you are overweight and have a high risk for diabetes, consider being tested at a younger age or more often. PREVENTING INFECTION  Hepatitis B  If you have a higher risk for hepatitis B, you should be screened for this virus. You are considered at high risk for hepatitis B if:  You were born in a country where hepatitis B is common. Ask your health care provider which countries are considered high risk.  Your parents were born in a high-risk country, and you have not been immunized against hepatitis B (hepatitis B vaccine).  You have HIV or AIDS.  You use needles to inject street drugs.  You live with someone who has hepatitis B.  You have had sex with someone who has hepatitis B.  You get hemodialysis treatment.  You take certain medicines for conditions, including cancer, organ transplantation, and autoimmune conditions. Hepatitis C  Blood testing is recommended for:  Everyone born from [birth year] through 1965.  Anyone with known risk factors for hepatitis C. Sexually transmitted infections (STIs)  You should be screened for sexually transmitted infections (STIs) including gonorrhea and chlamydia if:  You are sexually active and are younger than 70 years of age.  You are older than 70 years of age and your health care provider tells you that you are at risk for this type of infection.  Your sexual activity has changed since you were  last screened and you are at an increased risk for chlamydia or gonorrhea. Ask your health care provider if you are at risk.  If you do not have HIV, but are at risk, it may be recommended that you take a prescription medicine daily to prevent HIV infection. This is called pre-exposure prophylaxis (PrEP). You are considered at risk if:  You are sexually active and do not regularly use condoms or know the HIV status of your partner(s).  You take drugs by injection.  You  are sexually active with a partner who has HIV. Talk with your health care provider about whether you are at high risk of being infected with HIV. If you choose to begin PrEP, you should first be tested for HIV. You should then be tested every 3 months for as long as you are taking PrEP.  PREGNANCY   If you are premenopausal and you may become pregnant, ask your health care provider about preconception counseling.  If you may become pregnant, take 400 to 800 micrograms (mcg) of folic acid every day.  If you want to prevent pregnancy, talk to your health care provider about birth control (contraception). OSTEOPOROSIS AND MENOPAUSE   Osteoporosis is a disease in which the bones lose minerals and strength with aging. This can result in serious bone fractures. Your risk for osteoporosis can be identified using a bone density scan.  If you are 76 years of age or older, or if you are at risk for osteoporosis and fractures, ask your health care provider if you should be screened.  Ask your health care provider whether you should take a calcium or vitamin D supplement to lower your risk for osteoporosis.  Menopause may have certain physical symptoms and risks.  Hormone replacement therapy may reduce some of these symptoms and risks. Talk to your health care provider about whether hormone replacement therapy is right for you.  HOME CARE INSTRUCTIONS   Schedule regular health, dental, and eye exams.  Stay current with your  immunizations.   Do not use any tobacco products including cigarettes, chewing tobacco, or electronic cigarettes.  If you are pregnant, do not drink alcohol.  If you are breastfeeding, limit how much and how often you drink alcohol.  Limit alcohol intake to no more than 1 drink per day for nonpregnant women. One drink equals 12 ounces of beer, 5 ounces of wine, or 1 ounces of hard liquor.  Do not use street drugs.  Do not share needles.  Ask your health care provider for help if you need support or information about quitting drugs.  Tell your health care provider if you often feel depressed.  Tell your health care provider if you have ever been abused or do not feel safe at home.   This information is not intended to replace advice given to you by your health care provider. Make sure you discuss any questions you have with your health care provider.   Document Released: 08/07/2010 Document Revised: 02/12/2014 Document Reviewed: 12/24/2012 Elsevier Interactive Patient Education [employer].

## 2015-05-22 ENCOUNTER — Encounter: Payer: Self-pay | Admitting: Internal Medicine

## 2015-05-24 DIAGNOSIS — Z6824 Body mass index (BMI) 24.0-24.9, adult: Secondary | ICD-10-CM | POA: Diagnosis not present

## 2015-05-24 DIAGNOSIS — Z01419 Encounter for gynecological examination (general) (routine) without abnormal findings: Secondary | ICD-10-CM | POA: Diagnosis not present

## 2015-05-24 DIAGNOSIS — Z124 Encounter for screening for malignant neoplasm of cervix: Secondary | ICD-10-CM | POA: Diagnosis not present

## 2015-05-24 DIAGNOSIS — Z1231 Encounter for screening mammogram for malignant neoplasm of breast: Secondary | ICD-10-CM | POA: Diagnosis not present

## 2015-06-06 ENCOUNTER — Telehealth: Payer: Self-pay | Admitting: *Deleted

## 2015-06-06 MED ORDER — ATORVASTATIN CALCIUM 20 MG PO TABS: 20.0000 mg | ORAL_TABLET | Freq: Every day | ORAL | Status: DC

## 2015-06-06 MED ORDER — LOSARTAN POTASSIUM-HCTZ 100-12.5 MG PO TABS: 1.0000 | ORAL_TABLET | Freq: Every day | ORAL | Status: DC

## 2015-06-06 NOTE — Telephone Encounter (Signed)
Received call pt states she is needing new rx sent to new insurance plan mail service. Not sure of name gave pharmacy # on card [phone] w/member ID # [id number]. Called # plan uses [employer]. Sent rx electronically...Susan Holland

## 2015-10-31 DIAGNOSIS — Z961 Presence of intraocular lens: Secondary | ICD-10-CM | POA: Diagnosis not present

## 2015-10-31 DIAGNOSIS — H35373 Puckering of macula, bilateral: Secondary | ICD-10-CM | POA: Diagnosis not present

## 2015-10-31 DIAGNOSIS — H35033 Hypertensive retinopathy, bilateral: Secondary | ICD-10-CM | POA: Diagnosis not present

## 2015-10-31 DIAGNOSIS — H40013 Open angle with borderline findings, low risk, bilateral: Secondary | ICD-10-CM | POA: Diagnosis not present

## 2015-10-31 DIAGNOSIS — H26491 Other secondary cataract, right eye: Secondary | ICD-10-CM | POA: Diagnosis not present

## 2015-10-31 DIAGNOSIS — H26492 Other secondary cataract, left eye: Secondary | ICD-10-CM | POA: Diagnosis not present

## 2015-11-14 DIAGNOSIS — H26492 Other secondary cataract, left eye: Secondary | ICD-10-CM | POA: Diagnosis not present

## 2015-11-17 ENCOUNTER — Telehealth: Payer: Self-pay | Admitting: Internal Medicine

## 2015-11-17 NOTE — Telephone Encounter (Signed)
Please update chart to reflect patient had flu shot at [facility] drug 10/5.

## 2015-11-17 NOTE — Telephone Encounter (Signed)
updated

## 2016-02-08 DIAGNOSIS — L821 Other seborrheic keratosis: Secondary | ICD-10-CM | POA: Diagnosis not present

## 2016-02-08 DIAGNOSIS — L905 Scar conditions and fibrosis of skin: Secondary | ICD-10-CM | POA: Diagnosis not present

## 2016-02-08 DIAGNOSIS — L814 Other melanin hyperpigmentation: Secondary | ICD-10-CM | POA: Diagnosis not present

## 2016-02-08 DIAGNOSIS — Z85828 Personal history of other malignant neoplasm of skin: Secondary | ICD-10-CM | POA: Diagnosis not present

## 2016-02-09 DIAGNOSIS — H04123 Dry eye syndrome of bilateral lacrimal glands: Secondary | ICD-10-CM | POA: Diagnosis not present

## 2016-02-09 DIAGNOSIS — H40013 Open angle with borderline findings, low risk, bilateral: Secondary | ICD-10-CM | POA: Diagnosis not present

## 2016-05-16 ENCOUNTER — Telehealth: Payer: Self-pay | Admitting: General Practice

## 2016-05-16 NOTE — Telephone Encounter (Signed)
I spoke with the patient and confirmed that Dr. Billey Gosling is her PCP since Dr. Linna Darner retired. Susan Holland ([place])

## 2016-05-21 ENCOUNTER — Ambulatory Visit (INDEPENDENT_AMBULATORY_CARE_PROVIDER_SITE_OTHER): Payer: PPO | Admitting: Internal Medicine

## 2016-05-21 ENCOUNTER — Encounter: Payer: Self-pay | Admitting: Internal Medicine

## 2016-05-21 VITALS — BP 130/86 | HR 80 | Temp 98.8°F | Resp 16 | Ht 65.0 in | Wt 154.0 lb

## 2016-05-21 DIAGNOSIS — Z0000 Encounter for general adult medical examination without abnormal findings: Secondary | ICD-10-CM

## 2016-05-21 DIAGNOSIS — G479 Sleep disorder, unspecified: Secondary | ICD-10-CM | POA: Diagnosis not present

## 2016-05-21 DIAGNOSIS — F4320 Adjustment disorder, unspecified: Secondary | ICD-10-CM

## 2016-05-21 DIAGNOSIS — I10 Essential (primary) hypertension: Secondary | ICD-10-CM

## 2016-05-21 DIAGNOSIS — E782 Mixed hyperlipidemia: Secondary | ICD-10-CM

## 2016-05-21 DIAGNOSIS — Z1231 Encounter for screening mammogram for malignant neoplasm of breast: Secondary | ICD-10-CM | POA: Diagnosis not present

## 2016-05-21 DIAGNOSIS — F4321 Adjustment disorder with depressed mood: Secondary | ICD-10-CM

## 2016-05-21 LAB — HM MAMMOGRAPHY

## 2016-05-21 MED ORDER — ATORVASTATIN CALCIUM 20 MG PO TABS: 20.0000 mg | ORAL_TABLET | Freq: Every day | ORAL | 3 refills | Status: DC

## 2016-05-21 MED ORDER — MIRTAZAPINE 15 MG PO TABS: 15.0000 mg | ORAL_TABLET | Freq: Every day | ORAL | 5 refills | Status: DC

## 2016-05-21 MED ORDER — LOSARTAN POTASSIUM-HCTZ 100-12.5 MG PO TABS: 1.0000 | ORAL_TABLET | Freq: Every day | ORAL | 3 refills | Status: DC

## 2016-05-21 MED ORDER — MIRTAZAPINE 15 MG PO TABS: 15.0000 mg | ORAL_TABLET | Freq: Every day | ORAL | 0 refills | Status: DC

## 2016-05-21 NOTE — Assessment & Plan Note (Signed)
Discussed grieving process - what she is experiencing is normal She has good family support Will start remeron for mild depression/difficulty sleeping Fu in 1-2 months, sooner if needed

## 2016-05-21 NOTE — Progress Notes (Signed)
Pre visit review using our clinic review tool, if applicable. No additional management support is needed unless otherwise documented below in the visit note. 

## 2016-05-21 NOTE — Assessment & Plan Note (Signed)
Check lipid panel  Continue daily statin Regular exercise and healthy diet encouraged  

## 2016-05-21 NOTE — Assessment & Plan Note (Signed)
Will start remeron given likely mild depression associated with normal grieving process Follow up in 1-2 months, sooner if there are any problems

## 2016-05-21 NOTE — Progress Notes (Signed)
Subjective:    Patient ID: Susan Holland, female    DOB: 01/09/1946, 71 y.o.   MRN: [id number]  HPI She is here for a physical exam.  She will have a wellness visit with Sharee Pimple.   Her husband died.  She is having difficulty sleeping.  She wakes up frequently and has difficulty getting to sleep.  She eats one good meal a day.  She has not been doing much.  She is unsure if she is depressed or anxious.  She can not seem to cry.  She has good family support.  She has no other concerns.  She otherwise feels healthy.   Medications and allergies reviewed with patient and updated if appropriate.  Patient Active Problem List   Diagnosis Date Noted  . Nonspecific abnormal electrocardiogram (ECG) (EKG) 05/18/2014  . Essential hypertension 05/17/2014  . Glaucoma suspect 05/04/2013  . Skin cancer 03/19/2011  . Osteopenia 03/19/2011  . Abnormal Pap smear 03/19/2011  . History of colonic polyps 03/17/2010  . HYPERLIPIDEMIA 03/06/2007    Current Outpatient Prescriptions on File Prior to Visit  Medication Sig Dispense Refill  . aspirin 81 MG tablet Take 160 mg by mouth daily.    Marland Kitchen atorvastatin (LIPITOR) 20 MG tablet Take 1 tablet (20 mg total) by mouth daily. 90 tablet 3  . Calcium Carbonate-Vitamin D (CALCIUM + D PO) Take by mouth daily. Take 2 600 mg daily    . FIBER COMPLETE PO Take by mouth 2 (two) times daily.    Marland Kitchen losartan-hydrochlorothiazide (HYZAAR) 100-12.5 MG tablet Take 1 tablet by mouth daily. 90 tablet 3  . Multiple Vitamin (MULTIVITAMIN) tablet Take 1 tablet by mouth daily.     No current facility-administered medications on file prior to visit.     Past Medical History:  Diagnosis Date  . Colon polyp   . Diverticulosis   . GERD (gastroesophageal reflux disease)   . Glaucoma suspect   . Hyperlipidemia     Past Surgical History:  Procedure Laterality Date  . CATARACT EXTRACTION Bilateral 2016  . COLONOSCOPY W/ POLYPECTOMY  03/2011   repeat 2020; Dr Olevia Perches  . 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 TONSILLECTOMY AND ADENOIDECTOMY    . TUBAL LIGATION      Social History   Social History  . Marital status: Married    Spouse name: N/A  . Number of children: N/A  . Years of education: N/A   Social History Main Topics  . Smoking status: Former Smoker    Types: Cigarettes    Quit date: 03/29/2015  . Smokeless tobacco: Never Used     Comment: smoked 1963- present, up to 1/2 ppd  . Alcohol use 0.0 oz/week     Comment:  occasionally   . Drug use: No  . Sexual activity: Not on file   Other Topics Concern  . Not on file   Social History Narrative   Exercise:  Does some walking, but irregularly    Family History  Problem Relation Age of Onset  . Dementia Mother     Alsheimer's  . Stroke Father 60  . Heart disease Sister     AF  . Colon cancer Maternal Uncle     X 2  . Diabetes Neg Hx     Review of Systems  Constitutional: Negative for chills and fever.  Eyes: Negative for visual disturbance.  Respiratory: Negative for cough, shortness of  breath and wheezing.   Cardiovascular: Negative for chest pain, palpitations and leg swelling.  Gastrointestinal: Positive for constipation. Negative for abdominal pain, blood in stool, diarrhea and nausea.       No gerd  Genitourinary: Negative for dysuria and hematuria.  Musculoskeletal: Negative for arthralgias and back pain.  Skin: Negative for color change and rash.  Neurological: Negative for light-headedness and headaches.  Psychiatric/Behavioral: Positive for dysphoric mood. The patient is nervous/anxious.        Objective:   Vitals:   05/21/16 1413  BP: 130/86  Pulse: 80  Resp: 16  Temp: 98.8 F (37.1 C)   Filed Weights   05/21/16 1413  Weight: 154 lb (69.9 kg)   Body mass index is 25.63 kg/m.  Wt Readings from Last 3 Encounters:  05/21/16 154 lb (69.9 kg)  05/19/15 145 lb (65.8 kg)  05/31/14 161 lb 8 oz (73.3 kg)     Physical Exam Constitutional:  She appears well-developed and well-nourished. No distress.  HENT:  Head: Normocephalic and atraumatic.  Right Ear: External ear normal. Normal ear canal and TM Left Ear: External ear normal.  Normal ear canal and TM Mouth/Throat: Oropharynx is clear and moist.  Eyes: Conjunctivae and EOM are normal.  Neck: Neck supple. No tracheal deviation present. No thyromegaly present.  No carotid bruit  Cardiovascular: Normal rate, regular rhythm and normal heart sounds.   No murmur heard.  No edema. Pulmonary/Chest: Effort normal and breath sounds normal. No respiratory distress. She has no wheezes. She has no rales.  Breast: deferred to Gyn Abdominal: Soft. She exhibits no distension. There is no tenderness.  Lymphadenopathy: She has no cervical adenopathy.  Skin: Skin is warm and dry. She is not diaphoretic.  Psychiatric: She has a normal mood and affect. Her behavior is normal.         Assessment & Plan:   Physical exam: Screening blood work  ordered Immunizations  Up to date  Colonoscopy   Up to date  Mammogram  - done today Gyn    Up to date  Dexa   Up to date  Eye exams  Up to date  EKG last done 05/2014 Exercise - none at this time --- will restart Weight  BMI is good Skin    no concerns Substance abuse  none  See Problem List for Assessment and Plan of chronic medical problems.  FU 1-2 months

## 2016-05-21 NOTE — Patient Instructions (Addendum)
  Test(s) ordered today. Your results will be released to [place] (or called to you) after review, usually within 72hours after test completion. If any changes need to be made, you will be notified at that same time.  All other Health Maintenance issues reviewed.   All recommended immunizations and age-appropriate screenings are up-to-date or discussed.  No immunizations administered today.   Medications reviewed and updated.  Changes include starting remeron at night for sleep.   Your prescription(s) have been submitted to your pharmacy. Please take as directed and contact our office if you believe you are having problem(s) with the medication(s).   Please followup in 1-2 months for follow up of sleep

## 2016-05-21 NOTE — Assessment & Plan Note (Signed)
BP well controlled Current regimen effective and well tolerated Continue current medications at current doses  

## 2016-05-24 DIAGNOSIS — H35371 Puckering of macula, right eye: Secondary | ICD-10-CM | POA: Diagnosis not present

## 2016-05-24 DIAGNOSIS — H35373 Puckering of macula, bilateral: Secondary | ICD-10-CM | POA: Diagnosis not present

## 2016-05-24 DIAGNOSIS — H40013 Open angle with borderline findings, low risk, bilateral: Secondary | ICD-10-CM | POA: Diagnosis not present

## 2016-05-24 DIAGNOSIS — H35033 Hypertensive retinopathy, bilateral: Secondary | ICD-10-CM | POA: Diagnosis not present

## 2016-06-21 ENCOUNTER — Encounter: Payer: Self-pay | Admitting: Internal Medicine

## 2016-06-21 ENCOUNTER — Ambulatory Visit (INDEPENDENT_AMBULATORY_CARE_PROVIDER_SITE_OTHER): Payer: PPO | Admitting: Internal Medicine

## 2016-06-21 ENCOUNTER — Other Ambulatory Visit (INDEPENDENT_AMBULATORY_CARE_PROVIDER_SITE_OTHER): Payer: PPO

## 2016-06-21 VITALS — BP 136/86 | HR 70 | Temp 98.7°F | Resp 16 | Wt 154.0 lb

## 2016-06-21 DIAGNOSIS — S46819A Strain of other muscles, fascia and tendons at shoulder and upper arm level, unspecified arm, initial encounter: Secondary | ICD-10-CM | POA: Insufficient documentation

## 2016-06-21 DIAGNOSIS — F4321 Adjustment disorder with depressed mood: Secondary | ICD-10-CM

## 2016-06-21 DIAGNOSIS — I10 Essential (primary) hypertension: Secondary | ICD-10-CM

## 2016-06-21 DIAGNOSIS — S46812A Strain of other muscles, fascia and tendons at shoulder and upper arm level, left arm, initial encounter: Secondary | ICD-10-CM

## 2016-06-21 DIAGNOSIS — F4320 Adjustment disorder, unspecified: Secondary | ICD-10-CM | POA: Diagnosis not present

## 2016-06-21 DIAGNOSIS — G479 Sleep disorder, unspecified: Secondary | ICD-10-CM | POA: Diagnosis not present

## 2016-06-21 DIAGNOSIS — E782 Mixed hyperlipidemia: Secondary | ICD-10-CM | POA: Diagnosis not present

## 2016-06-21 LAB — COMPREHENSIVE METABOLIC PANEL
ALBUMIN: 4.4 g/dL (ref 3.5–5.2)
ALT: 12 U/L (ref 0–35)
AST: 14 U/L (ref 0–37)
Alkaline Phosphatase: 47 U/L (ref 39–117)
BILIRUBIN TOTAL: 0.5 mg/dL (ref 0.2–1.2)
BUN: 8 mg/dL (ref 6–23)
CALCIUM: 9.7 mg/dL (ref 8.4–10.5)
CO2: 31 mEq/L (ref 19–32)
Chloride: 102 mEq/L (ref 96–112)
Creatinine, Ser: 0.75 mg/dL (ref 0.40–1.20)
GFR: 80.91 mL/min (ref >60.00)
Glucose, Bld: 102 mg/dL — ABNORMAL HIGH (ref 70–99)
Potassium: 4.4 mEq/L (ref 3.5–5.1)
Sodium: 139 mEq/L (ref 135–145)
Total Protein: 7.1 g/dL (ref 6.0–8.3)

## 2016-06-21 LAB — CBC WITH DIFFERENTIAL/PLATELET
BASOS ABS: 0.1 10*3/uL (ref 0.0–0.1)
Basophils Relative: 0.9 % (ref 0.0–3.0)
EOS ABS: 0.1 10*3/uL (ref 0.0–0.7)
EOS PCT: 0.8 % (ref 0.0–5.0)
HEMATOCRIT: 44.1 % (ref 36.0–46.0)
HEMOGLOBIN: 14.8 g/dL (ref 12.0–15.0)
LYMPHS PCT: 20.6 % (ref 12.0–46.0)
Lymphs Abs: 2 10*3/uL (ref 0.7–4.0)
MCHC: 33.4 g/dL (ref 30.0–36.0)
MCV: 93 fl (ref 78.0–100.0)
Monocytes Absolute: 0.7 10*3/uL (ref 0.1–1.0)
Monocytes Relative: 7.2 % (ref 3.0–12.0)
NEUTROS ABS: 6.8 10*3/uL (ref 1.4–7.7)
Neutrophils Relative %: 70.5 % (ref 43.0–77.0)
Platelets: 232 10*3/uL (ref 150.0–400.0)
RBC: 4.75 Mil/uL (ref 3.87–5.11)
RDW: 13.4 % (ref 11.5–15.5)
WBC: 9.6 10*3/uL (ref 4.0–10.5)

## 2016-06-21 LAB — LIPID PANEL
CHOL/HDL RATIO: 2
CHOLESTEROL: 134 mg/dL (ref 0–200)
HDL: 60.6 mg/dL (ref >39.00)
LDL Cholesterol: 58 mg/dL (ref 0–99)
NonHDL: 73.25
TRIGLYCERIDES: 74 mg/dL (ref 0.0–149.0)
VLDL: 14.8 mg/dL (ref 0.0–40.0)

## 2016-06-21 LAB — TSH: TSH: 1.99 u[IU]/mL (ref 0.35–4.50)

## 2016-06-21 MED ORDER — SERTRALINE HCL 25 MG PO TABS: 25.0000 mg | ORAL_TABLET | Freq: Every day | ORAL | 3 refills | Status: DC

## 2016-06-21 NOTE — Assessment & Plan Note (Addendum)
Well controlled continue current medication Cmp, cbc, tsh

## 2016-06-21 NOTE — Assessment & Plan Note (Signed)
With elements of depression and anxiety Tried remeron but was not taking nightly therefore not effective Will try sertraline 25 mg at night - advised she needs to take it nightly Can still take remeron as needed, but hopefully will not need this once sertraline kicks in

## 2016-06-21 NOTE — Progress Notes (Signed)
Subjective:    Patient ID: Susan Holland, female    DOB: 1945-03-11, 71 y.o.   MRN: [id number]  HPI The patient is here for follow up.  Difficulty sleeping, grieving:  Taking a full dose of remeron make her too tired.  She tried taking 1/2 of a pill and that helped her sleep.  She has not been taking it nightly.  She still feels depressed and has been crying intermittently.  She feels anxious at times.  She is trying to keep busy, but sometimes has difficulty with motivation.  Appetite is good.    She pulled a muscle in her back recently.  The pain extends from the scapula on the left side to the neck.  It is sore and is worse with movement. She has taken aleve and it helps.  She has felt soreness in her arm, but no real pain.  She denies headaches, numbness/tingling and weakness in her arm.   Hyperlipidemia: She is taking her medication daily. She is compliant with a low fat/cholesterol diet. She is not exercising regularly. She denies myalgias.   Hypertension: She is taking her medication daily. She is compliant with a low sodium diet.  She denies chest pain, palpitations, edema, shortness of breath and regular headaches. She is not exercising regularly.     Medications and allergies reviewed with patient and updated if appropriate.  Patient Active Problem List   Diagnosis Date Noted  . Difficulty sleeping 05/21/2016  . Grieving 05/21/2016  . Nonspecific abnormal electrocardiogram (ECG) (EKG) 05/18/2014  . Essential hypertension 05/17/2014  . Glaucoma suspect 05/04/2013  . Skin cancer 03/19/2011  . Osteopenia 03/19/2011  . Abnormal Pap smear 03/19/2011  . History of colonic polyps 03/17/2010  . HYPERLIPIDEMIA 03/06/2007    Current Outpatient Prescriptions on File Prior to Visit  Medication Sig Dispense Refill  . aspirin 81 MG tablet Take 160 mg by mouth daily.    Marland Kitchen atorvastatin (LIPITOR) 20 MG tablet Take 1 tablet (20 mg total) by mouth daily. 90 tablet 3  . Calcium  Carbonate-Vitamin D (CALCIUM + D PO) Take by mouth daily. Take 2 600 mg daily    . FIBER COMPLETE PO Take by mouth 2 (two) times daily.    Marland Kitchen losartan-hydrochlorothiazide (HYZAAR) 100-12.5 MG tablet Take 1 tablet by mouth daily. 90 tablet 3  . mirtazapine (REMERON) 15 MG tablet Take 1 tablet (15 mg total) by mouth at bedtime. (Patient taking differently: Take 7.5 mg by mouth at bedtime. ) 30 tablet 5  . Multiple Vitamin (MULTIVITAMIN) tablet Take 1 tablet by mouth daily.     No current facility-administered medications on file prior to visit.     Past Medical History:  Diagnosis Date  . Colon polyp   . Diverticulosis   . GERD (gastroesophageal reflux disease)   . Glaucoma suspect   . Hyperlipidemia     Past Surgical History:  Procedure Laterality Date  . CATARACT EXTRACTION Bilateral 2016  . COLONOSCOPY W/ POLYPECTOMY  03/2011   repeat 2020; Dr Olevia Perches  . 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 TONSILLECTOMY AND ADENOIDECTOMY    . TUBAL LIGATION      Social History   Social History  . Marital status: Married    Spouse name: N/A  . Number of children: N/A  . Years of education: N/A   Social History Main Topics  . Smoking status: Former Smoker    Types:  Cigarettes    Quit date: 03/29/2015  . Smokeless tobacco: Never Used     Comment: smoked 1963- present, up to 1/2 ppd  . Alcohol use 0.0 oz/week     Comment:  occasionally   . Drug use: No  . Sexual activity: Not Asked   Other Topics Concern  . None   Social History Narrative   Exercise:  Does some walking, but irregularly    Family History  Problem Relation Age of Onset  . Dementia Mother        Alsheimer's  . Stroke Father 47  . Heart disease Sister        AF  . Colon cancer Maternal Uncle        X 2  . Diabetes Neg Hx     Review of Systems  Constitutional: Negative for fever.  Respiratory: Negative for cough, shortness of breath and wheezing.   Cardiovascular: Negative for chest  pain, palpitations and leg swelling.  Neurological: Negative for weakness, light-headedness, numbness and headaches.  Psychiatric/Behavioral: Positive for dysphoric mood and sleep disturbance. The patient is nervous/anxious.        Objective:   Vitals:   06/21/16 0849  BP: 136/86  Pulse: 70  Resp: 16  Temp: 98.7 F (37.1 C)   Wt Readings from Last 3 Encounters:  06/21/16 154 lb (69.9 kg)  05/21/16 154 lb (69.9 kg)  05/19/15 145 lb (65.8 kg)   Body mass index is 25.63 kg/m.   Physical Exam    Constitutional: Appears well-developed and well-nourished. No distress.  HENT:  Head: Normocephalic and atraumatic.  Neck: Neck supple. No tracheal deviation present. No thyromegaly present.  No cervical lymphadenopathy Cardiovascular: Normal rate, regular rhythm and normal heart sounds.   No murmur heard. No carotid bruit .  No edema Pulmonary/Chest: Effort normal and breath sounds normal. No respiratory distress. No has no wheezes. No rales.  Skin: Skin is warm and dry. Not diaphoretic.  Psychiatric: depressed mood and affect. Behavior is normal.      Assessment & Plan:    See Problem List for Assessment and Plan of chronic medical problems.

## 2016-06-21 NOTE — Patient Instructions (Addendum)
  Test(s) ordered today. Your results will be released to [place] (or called to you) after review, usually within 72hours after test completion. If any changes need to be made, you will be notified at that same time.  Medications reviewed and updated.  Changes include starting sertraline 25 mg at night.  Your prescription(s) have been submitted to your pharmacy. Please take as directed and contact our office if you believe you are having problem(s) with the medication(s).    Please followup in 6 months

## 2016-06-21 NOTE — Assessment & Plan Note (Signed)
muscle strain on left Aleve/advil, ice/heat, stretch, massage Declined muscle relaxer

## 2016-06-21 NOTE — Assessment & Plan Note (Signed)
Will try sertraline 25 mg at night Can still take remeron as needed, but hopefully will not need this once sertraline kicks in - advised remeron is not ideally effective if not taking nightly

## 2016-06-21 NOTE — Assessment & Plan Note (Signed)
Check lipid, cmp continue statin

## 2016-10-04 ENCOUNTER — Telehealth: Payer: Self-pay | Admitting: Internal Medicine

## 2016-10-04 MED ORDER — SERTRALINE HCL 25 MG PO TABS: 25.0000 mg | ORAL_TABLET | Freq: Every day | ORAL | 3 refills | Status: DC

## 2016-10-04 NOTE — Telephone Encounter (Signed)
Pt called for a refill of her sertraline (ZOLOFT) 25 MG tablet  Had CPE on 05/2016 Please send to [facility] 90 day supply

## 2016-10-15 NOTE — Telephone Encounter (Signed)
Pt wants this sent to [facility]. This will need to be resent.

## 2016-10-16 MED ORDER — SERTRALINE HCL 25 MG PO TABS: 25.0000 mg | ORAL_TABLET | Freq: Every day | ORAL | 2 refills | Status: DC

## 2016-10-16 NOTE — Addendum Note (Signed)
Addended by: Earnstine Regal on: 10/16/2016 09:41 AM   Modules accepted: Orders

## 2016-10-16 NOTE — Telephone Encounter (Signed)
Reviewed chart pt is up-to-date sent refills to [facility]  

## 2017-02-11 DIAGNOSIS — L814 Other melanin hyperpigmentation: Secondary | ICD-10-CM | POA: Diagnosis not present

## 2017-02-11 DIAGNOSIS — L821 Other seborrheic keratosis: Secondary | ICD-10-CM | POA: Diagnosis not present

## 2017-02-11 DIAGNOSIS — D229 Melanocytic nevi, unspecified: Secondary | ICD-10-CM | POA: Diagnosis not present

## 2017-02-11 DIAGNOSIS — L570 Actinic keratosis: Secondary | ICD-10-CM | POA: Diagnosis not present

## 2017-02-14 DIAGNOSIS — H16223 Keratoconjunctivitis sicca, not specified as Sjogren's, bilateral: Secondary | ICD-10-CM | POA: Diagnosis not present

## 2017-02-14 DIAGNOSIS — H179 Unspecified corneal scar and opacity: Secondary | ICD-10-CM | POA: Diagnosis not present

## 2017-02-14 DIAGNOSIS — H04123 Dry eye syndrome of bilateral lacrimal glands: Secondary | ICD-10-CM | POA: Diagnosis not present

## 2017-02-14 DIAGNOSIS — H40013 Open angle with borderline findings, low risk, bilateral: Secondary | ICD-10-CM | POA: Diagnosis not present

## 2017-05-30 DIAGNOSIS — H35371 Puckering of macula, right eye: Secondary | ICD-10-CM | POA: Diagnosis not present

## 2017-05-30 DIAGNOSIS — H35033 Hypertensive retinopathy, bilateral: Secondary | ICD-10-CM | POA: Diagnosis not present

## 2017-05-30 DIAGNOSIS — H40013 Open angle with borderline findings, low risk, bilateral: Secondary | ICD-10-CM | POA: Diagnosis not present

## 2017-05-30 DIAGNOSIS — H43813 Vitreous degeneration, bilateral: Secondary | ICD-10-CM | POA: Diagnosis not present

## 2017-06-04 NOTE — Progress Notes (Signed)
Subjective:    Patient ID: Susan Holland, female    DOB: November 15, 1945, 72 y.o.   MRN: [id number]  HPI She is here for a physical exam.   She has no concerns.   She did fall a few weeks ago and hit the back of her head.  She has some soreness there and feels a little unbalanced but denies headaches.  She has occasional dizziness.  The symptoms are improving.  She knows she needs to be more careful.  She fell one other time - stepping wrong and looking down at the same time - there were no injuries.    She is not currently exercising.  She ran out of her htn meds a few days ago.    Medications and allergies reviewed with patient and updated if appropriate.  Patient Active Problem List   Diagnosis Date Noted  . Hyperglycemia 06/05/2017  . Difficulty sleeping 05/21/2016  . Grieving 05/21/2016  . Nonspecific abnormal electrocardiogram (ECG) (EKG) 05/18/2014  . Essential hypertension 05/17/2014  . Glaucoma suspect 05/04/2013  . Skin cancer 03/19/2011  . Osteopenia 03/19/2011  . History of colonic polyps 03/17/2010  . HYPERLIPIDEMIA 03/06/2007    Current Outpatient Medications on File Prior to Visit  Medication Sig Dispense Refill  . aspirin 81 MG tablet Take 160 mg by mouth daily.    . Calcium Carbonate-Vitamin D (CALCIUM + D PO) Take by mouth daily. Take 2 600 mg daily    . FIBER COMPLETE PO Take by mouth 2 (two) times daily.    . Multiple Vitamin (MULTIVITAMIN) tablet Take 1 tablet by mouth daily.     No current facility-administered medications on file prior to visit.     Past Medical History:  Diagnosis Date  . Colon polyp   . Diverticulosis   . GERD (gastroesophageal reflux disease)   . Glaucoma suspect   . Hyperlipidemia     Past Surgical History:  Procedure Laterality Date  . CATARACT EXTRACTION Bilateral 2016  . COLONOSCOPY W/ POLYPECTOMY  03/2011   repeat 2020; Dr Olevia Perches  . 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 TONSILLECTOMY AND  ADENOIDECTOMY    . TUBAL LIGATION      Social History   Socioeconomic History  . Marital status: Widowed    Spouse name: Not on file  . Number of children: 2  . Years of education: Not on file  . Highest education level: Not on file  Occupational History  . Not on file  Social Needs  . Financial resource strain: Not hard at all  . Food insecurity:    Worry: Never true    Inability: Never true  . Transportation needs:    Medical: No    Non-medical: No  Tobacco Use  . Smoking status: Former Smoker    Types: Cigarettes    Last attempt to quit: 03/29/2015    Years since quitting: 2.1  . Smokeless tobacco: Never Used  . Tobacco comment: smoked 1963- present, up to 1/2 ppd  Substance and Sexual Activity  . Alcohol use: Yes    Alcohol/week: 0.0 oz    Comment:  occasionally   . Drug use: No  . Sexual activity: Never  Lifestyle  . Physical activity:    Days per week: 0 days    Minutes per session: 0 min  . Stress: Not at all  Relationships  . Social connections:    Talks on phone:  More than three times a week    Gets together: Not on file    Attends religious service: 1 to 4 times per year    Active member of club or organization: Not on file    Attends meetings of clubs or organizations: More than 4 times per year    Relationship status: Widowed  Other Topics Concern  . Not on file  Social History Narrative   Exercise:  Does some walking, but irregularly    Family History  Problem Relation Age of Onset  . Dementia Mother        Alsheimer's  . Stroke Father 70  . Heart disease Sister        AF  . Colon cancer Maternal Uncle        X 2  . Diabetes Neg Hx     Review of Systems  Constitutional: Negative for chills and fever.  Eyes: Negative for visual disturbance.  Respiratory: Negative for cough, shortness of breath and wheezing.   Cardiovascular: Negative for chest pain, palpitations and leg swelling.  Gastrointestinal: Negative for abdominal pain, blood in  stool, constipation, diarrhea and nausea.       No gerd  Genitourinary: Negative for dysuria and hematuria.  Musculoskeletal: Negative for arthralgias and back pain.  Skin: Negative for color change and rash.  Neurological: Positive for dizziness (occasionally). Negative for light-headedness and headaches.  Psychiatric/Behavioral: Positive for dysphoric mood (controlled). The patient is nervous/anxious (controlled).        Objective:   Vitals:   06/05/17 0906  BP: (!) 148/85  Pulse: 64  Resp: 16  Temp: 98.2 F (36.8 C)  SpO2: 98%   Filed Weights   06/05/17 0906  Weight: 174 lb (78.9 kg)   Body mass index is 28.96 kg/m.   Wt Readings from Last 3 Encounters:  06/05/17 174 lb (78.9 kg)  06/05/17 174 lb (78.9 kg)  06/21/16 154 lb (69.9 kg)     Physical Exam Constitutional: She appears well-developed and well-nourished. No distress.  HENT:  Head: Normocephalic and atraumatic.  Right Ear: External ear normal. Normal ear canal and TM Left Ear: External ear normal.  Normal ear canal and TM Mouth/Throat: Oropharynx is clear and moist.  Eyes: Conjunctivae and EOM are normal.  Neck: Neck supple. No tracheal deviation present. No thyromegaly present.  No carotid bruit  Cardiovascular: Normal rate, regular rhythm and normal heart sounds.   No murmur heard.  No edema. Pulmonary/Chest: Effort normal and breath sounds normal. No respiratory distress. She has no wheezes. She has no rales.  Breast: deferred to Gyn Abdominal: Soft. She exhibits no distension. There is no tenderness.  Lymphadenopathy: She has no cervical adenopathy.  Skin: Skin is warm and dry. She is not diaphoretic.  Psychiatric: She has a normal mood and affect. Her behavior is normal.        Assessment & Plan:   Physical exam: Screening blood work  ordered Immunizations   Discussed shingrix, others up to date Colonoscopy   Up to date  Mammogram   Up to date Dexa    Up to date - due this year Eye exams   Up to date  EKG   Last done 05/2014 Exercise  Advised regular exercise Weight    Work on weight loss Skin   No concerns Substance abuse  none  See Problem List for Assessment and Plan of chronic medical problems.   FU in 6 months

## 2017-06-04 NOTE — Patient Instructions (Addendum)
Test(s) ordered today. Your results will be released to MyChart (or called to you) after review, usually within 72hours after test completion. If any changes need to be made, you will be notified at that same time.  All other Health Maintenance issues reviewed.   All recommended immunizations and age-appropriate screenings are up-to-date or discussed.  No immunizations administered today.   Medications reviewed and updated.  No changes recommended at this time.  Your prescription(s) have been submitted to your pharmacy. Please take as directed and contact our office if you believe you are having problem(s) with the medication(s).  Please followup in 6 months   Health Maintenance, Female Adopting a healthy lifestyle and getting preventive care can go a long way to promote health and wellness. Talk with your health care provider about what schedule of regular examinations is right for you. This is a good chance for you to check in with your provider about disease prevention and staying healthy. In between checkups, there are plenty of things you can do on your own. Experts have done a lot of research about which lifestyle changes and preventive measures are most likely to keep you healthy. Ask your health care provider for more information. Weight and diet Eat a healthy diet  Be sure to include plenty of vegetables, fruits, low-fat dairy products, and lean protein.  Do not eat a lot of foods high in solid fats, added sugars, or salt.  Get regular exercise. This is one of the most important things you can do for your health. ? Most adults should exercise for at least 150 minutes each week. The exercise should increase your heart rate and make you sweat (moderate-intensity exercise). ? Most adults should also do strengthening exercises at least twice a week. This is in addition to the moderate-intensity exercise.  Maintain a healthy weight  Body mass index (BMI) is a measurement that can  be used to identify possible weight problems. It estimates body fat based on height and weight. Your health care provider can help determine your BMI and help you achieve or maintain a healthy weight.  For females 20 years of age and older: ? A BMI below 18.5 is considered underweight. ? A BMI of 18.5 to 24.9 is normal. ? A BMI of 25 to 29.9 is considered overweight. ? A BMI of 30 and above is considered obese.  Watch levels of cholesterol and blood lipids  You should start having your blood tested for lipids and cholesterol at 72 years of age, then have this test every 5 years.  You may need to have your cholesterol levels checked more often if: ? Your lipid or cholesterol levels are high. ? You are older than 72 years of age. ? You are at high risk for heart disease.  Cancer screening Lung Cancer  Lung cancer screening is recommended for adults 55-80 years old who are at high risk for lung cancer because of a history of smoking.  A yearly low-dose CT scan of the lungs is recommended for people who: ? Currently smoke. ? Have quit within the past 15 years. ? Have at least a 30-pack-year history of smoking. A pack year is smoking an average of one pack of cigarettes a day for 1 year.  Yearly screening should continue until it has been 15 years since you quit.  Yearly screening should stop if you develop a health problem that would prevent you from having lung cancer treatment.  Breast Cancer  Practice breast self-awareness. This   means understanding how your breasts normally appear and feel.  It also means doing regular breast self-exams. Let your health care provider know about any changes, no matter how small.  If you are in your 20s or 30s, you should have a clinical breast exam (CBE) by a health care provider every 1-3 years as part of a regular health exam.  If you are 40 or older, have a CBE every year. Also consider having a breast X-ray (mammogram) every year.  If you  have a family history of breast cancer, talk to your health care provider about genetic screening.  If you are at high risk for breast cancer, talk to your health care provider about having an MRI and a mammogram every year.  Breast cancer gene (BRCA) assessment is recommended for women who have family members with BRCA-related cancers. BRCA-related cancers include: ? Breast. ? Ovarian. ? Tubal. ? Peritoneal cancers.  Results of the assessment will determine the need for genetic counseling and BRCA1 and BRCA2 testing.  Cervical Cancer Your health care provider may recommend that you be screened regularly for cancer of the pelvic organs (ovaries, uterus, and vagina). This screening involves a pelvic examination, including checking for microscopic changes to the surface of your cervix (Pap test). You may be encouraged to have this screening done every 3 years, beginning at age 21.  For women ages 30-65, health care providers may recommend pelvic exams and Pap testing every 3 years, or they may recommend the Pap and pelvic exam, combined with testing for human papilloma virus (HPV), every 5 years. Some types of HPV increase your risk of cervical cancer. Testing for HPV may also be done on women of any age with unclear Pap test results.  Other health care providers may not recommend any screening for nonpregnant women who are considered low risk for pelvic cancer and who do not have symptoms. Ask your health care provider if a screening pelvic exam is right for you.  If you have had past treatment for cervical cancer or a condition that could lead to cancer, you need Pap tests and screening for cancer for at least 20 years after your treatment. If Pap tests have been discontinued, your risk factors (such as having a new sexual partner) need to be reassessed to determine if screening should resume. Some women have medical problems that increase the chance of getting cervical cancer. In these cases,  your health care provider may recommend more frequent screening and Pap tests.  Colorectal Cancer  This type of cancer can be detected and often prevented.  Routine colorectal cancer screening usually begins at 72 years of age and continues through 72 years of age.  Your health care provider may recommend screening at an earlier age if you have risk factors for colon cancer.  Your health care provider may also recommend using home test kits to check for hidden blood in the stool.  A small camera at the end of a tube can be used to examine your colon directly (sigmoidoscopy or colonoscopy). This is done to check for the earliest forms of colorectal cancer.  Routine screening usually begins at age 50.  Direct examination of the colon should be repeated every 5-10 years through 72 years of age. However, you may need to be screened more often if early forms of precancerous polyps or small growths are found.  Skin Cancer  Check your skin from head to toe regularly.  Tell your health care provider about any new   moles or changes in moles, especially if there is a change in a mole's shape or color.  Also tell your health care provider if you have a mole that is larger than the size of a pencil eraser.  Always use sunscreen. Apply sunscreen liberally and repeatedly throughout the day.  Protect yourself by wearing long sleeves, pants, a wide-brimmed hat, and sunglasses whenever you are outside.  Heart disease, diabetes, and high blood pressure  High blood pressure causes heart disease and increases the risk of stroke. High blood pressure is more likely to develop in: ? People who have blood pressure in the high end of the normal range (130-139/85-89 mm Hg). ? People who are overweight or obese. ? People who are African American.  If you are 18-39 years of age, have your blood pressure checked every 3-5 years. If you are 40 years of age or older, have your blood pressure checked every year.  You should have your blood pressure measured twice-once when you are at a hospital or clinic, and once when you are not at a hospital or clinic. Record the average of the two measurements. To check your blood pressure when you are not at a hospital or clinic, you can use: ? An automated blood pressure machine at a pharmacy. ? A home blood pressure monitor.  If you are between 55 years and 79 years old, ask your health care provider if you should take aspirin to prevent strokes.  Have regular diabetes screenings. This involves taking a blood sample to check your fasting blood sugar level. ? If you are at a normal weight and have a low risk for diabetes, have this test once every three years after 72 years of age. ? If you are overweight and have a high risk for diabetes, consider being tested at a younger age or more often. Preventing infection Hepatitis B  If you have a higher risk for hepatitis B, you should be screened for this virus. You are considered at high risk for hepatitis B if: ? You were born in a country where hepatitis B is common. Ask your health care provider which countries are considered high risk. ? Your parents were born in a high-risk country, and you have not been immunized against hepatitis B (hepatitis B vaccine). ? You have HIV or AIDS. ? You use needles to inject street drugs. ? You live with someone who has hepatitis B. ? You have had sex with someone who has hepatitis B. ? You get hemodialysis treatment. ? You take certain medicines for conditions, including cancer, organ transplantation, and autoimmune conditions.  Hepatitis C  Blood testing is recommended for: ? Everyone born from 1945 through 1965. ? Anyone with known risk factors for hepatitis C.  Sexually transmitted infections (STIs)  You should be screened for sexually transmitted infections (STIs) including gonorrhea and chlamydia if: ? You are sexually active and are younger than 72 years of  age. ? You are older than 72 years of age and your health care provider tells you that you are at risk for this type of infection. ? Your sexual activity has changed since you were last screened and you are at an increased risk for chlamydia or gonorrhea. Ask your health care provider if you are at risk.  If you do not have HIV, but are at risk, it may be recommended that you take a prescription medicine daily to prevent HIV infection. This is called pre-exposure prophylaxis (PrEP). You are considered at risk   if: ? You are sexually active and do not regularly use condoms or know the HIV status of your partner(s). ? You take drugs by injection. ? You are sexually active with a partner who has HIV.  Talk with your health care provider about whether you are at high risk of being infected with HIV. If you choose to begin PrEP, you should first be tested for HIV. You should then be tested every 3 months for as long as you are taking PrEP. Pregnancy  If you are premenopausal and you may become pregnant, ask your health care provider about preconception counseling.  If you may become pregnant, take 400 to 800 micrograms (mcg) of folic acid every day.  If you want to prevent pregnancy, talk to your health care provider about birth control (contraception). Osteoporosis and menopause  Osteoporosis is a disease in which the bones lose minerals and strength with aging. This can result in serious bone fractures. Your risk for osteoporosis can be identified using a bone density scan.  If you are 75 years of age or older, or if you are at risk for osteoporosis and fractures, ask your health care provider if you should be screened.  Ask your health care provider whether you should take a calcium or vitamin D supplement to lower your risk for osteoporosis.  Menopause may have certain physical symptoms and risks.  Hormone replacement therapy may reduce some of these symptoms and risks. Talk to your health  care provider about whether hormone replacement therapy is right for you. Follow these instructions at home:  Schedule regular health, dental, and eye exams.  Stay current with your immunizations.  Do not use any tobacco products including cigarettes, chewing tobacco, or electronic cigarettes.  If you are pregnant, do not drink alcohol.  If you are breastfeeding, limit how much and how often you drink alcohol.  Limit alcohol intake to no more than 1 drink per day for nonpregnant women. One drink equals 12 ounces of beer, 5 ounces of wine, or 1 ounces of hard liquor.  Do not use street drugs.  Do not share needles.  Ask your health care provider for help if you need support or information about quitting drugs.  Tell your health care provider if you often feel depressed.  Tell your health care provider if you have ever been abused or do not feel safe at home. This information is not intended to replace advice given to you by your health care provider. Make sure you discuss any questions you have with your health care provider. Document Released: 08/07/2010 Document Revised: 06/30/2015 Document Reviewed: 10/26/2014 Elsevier Interactive Patient Education  [employer].

## 2017-06-05 ENCOUNTER — Ambulatory Visit (INDEPENDENT_AMBULATORY_CARE_PROVIDER_SITE_OTHER): Payer: PPO | Admitting: *Deleted

## 2017-06-05 ENCOUNTER — Ambulatory Visit (INDEPENDENT_AMBULATORY_CARE_PROVIDER_SITE_OTHER): Payer: PPO | Admitting: Internal Medicine

## 2017-06-05 ENCOUNTER — Other Ambulatory Visit (INDEPENDENT_AMBULATORY_CARE_PROVIDER_SITE_OTHER): Payer: PPO

## 2017-06-05 ENCOUNTER — Encounter: Payer: Self-pay | Admitting: Internal Medicine

## 2017-06-05 VITALS — BP 148/85 | HR 64 | Temp 98.2°F | Resp 18 | Ht 65.0 in | Wt 174.0 lb

## 2017-06-05 VITALS — BP 148/85 | HR 64 | Temp 98.2°F | Resp 16 | Ht 65.0 in | Wt 174.0 lb

## 2017-06-05 DIAGNOSIS — I10 Essential (primary) hypertension: Secondary | ICD-10-CM

## 2017-06-05 DIAGNOSIS — R739 Hyperglycemia, unspecified: Secondary | ICD-10-CM | POA: Diagnosis not present

## 2017-06-05 DIAGNOSIS — F4321 Adjustment disorder with depressed mood: Secondary | ICD-10-CM | POA: Diagnosis not present

## 2017-06-05 DIAGNOSIS — E782 Mixed hyperlipidemia: Secondary | ICD-10-CM | POA: Diagnosis not present

## 2017-06-05 DIAGNOSIS — Z0000 Encounter for general adult medical examination without abnormal findings: Secondary | ICD-10-CM | POA: Diagnosis not present

## 2017-06-05 DIAGNOSIS — E119 Type 2 diabetes mellitus without complications: Secondary | ICD-10-CM | POA: Insufficient documentation

## 2017-06-05 DIAGNOSIS — G479 Sleep disorder, unspecified: Secondary | ICD-10-CM

## 2017-06-05 DIAGNOSIS — R7303 Prediabetes: Secondary | ICD-10-CM

## 2017-06-05 DIAGNOSIS — E1169 Type 2 diabetes mellitus with other specified complication: Secondary | ICD-10-CM | POA: Insufficient documentation

## 2017-06-05 LAB — TSH: TSH: 2.21 u[IU]/mL (ref 0.35–4.50)

## 2017-06-05 LAB — COMPREHENSIVE METABOLIC PANEL
ALBUMIN: 4.5 g/dL (ref 3.5–5.2)
ALK PHOS: 53 U/L (ref 39–117)
ALT: 16 U/L (ref 0–35)
AST: 19 U/L (ref 0–37)
BILIRUBIN TOTAL: 0.7 mg/dL (ref 0.2–1.2)
BUN: 8 mg/dL (ref 6–23)
CO2: 30 mEq/L (ref 19–32)
Calcium: 10.2 mg/dL (ref 8.4–10.5)
Chloride: 100 mEq/L (ref 96–112)
Creatinine, Ser: 0.7 mg/dL (ref 0.40–1.20)
GFR: 87.38 mL/min (ref >60.00)
GLUCOSE: 105 mg/dL — AB (ref 70–99)
Potassium: 4.3 mEq/L (ref 3.5–5.1)
Sodium: 139 mEq/L (ref 135–145)
TOTAL PROTEIN: 7.4 g/dL (ref 6.0–8.3)

## 2017-06-05 LAB — CBC WITH DIFFERENTIAL/PLATELET
BASOS ABS: 0.1 10*3/uL (ref 0.0–0.1)
Basophils Relative: 0.8 % (ref 0.0–3.0)
EOS PCT: 0.5 % (ref 0.0–5.0)
Eosinophils Absolute: 0 10*3/uL (ref 0.0–0.7)
HEMATOCRIT: 43.8 % (ref 36.0–46.0)
HEMOGLOBIN: 14.9 g/dL (ref 12.0–15.0)
LYMPHS ABS: 1.7 10*3/uL (ref 0.7–4.0)
LYMPHS PCT: 21.5 % (ref 12.0–46.0)
MCHC: 34 g/dL (ref 30.0–36.0)
MCV: 92.7 fl (ref 78.0–100.0)
MONOS PCT: 8.5 % (ref 3.0–12.0)
Monocytes Absolute: 0.7 10*3/uL (ref 0.1–1.0)
NEUTROS PCT: 68.7 % (ref 43.0–77.0)
Neutro Abs: 5.5 10*3/uL (ref 1.4–7.7)
Platelets: 210 10*3/uL (ref 150.0–400.0)
RBC: 4.73 Mil/uL (ref 3.87–5.11)
RDW: 13.4 % (ref 11.5–15.5)
WBC: 8.1 10*3/uL (ref 4.0–10.5)

## 2017-06-05 LAB — LIPID PANEL
CHOL/HDL RATIO: 2
Cholesterol: 154 mg/dL (ref 0–200)
HDL: 63 mg/dL (ref >39.00)
LDL Cholesterol: 78 mg/dL (ref 0–99)
NONHDL: 91.35
Triglycerides: 69 mg/dL (ref 0.0–149.0)
VLDL: 13.8 mg/dL (ref 0.0–40.0)

## 2017-06-05 LAB — HEMOGLOBIN A1C: HEMOGLOBIN A1C: 6.2 % (ref 4.6–6.5)

## 2017-06-05 MED ORDER — LOSARTAN POTASSIUM-HCTZ 100-12.5 MG PO TABS: 1.0000 | ORAL_TABLET | Freq: Every day | ORAL | 3 refills | Status: DC

## 2017-06-05 MED ORDER — ATORVASTATIN CALCIUM 20 MG PO TABS: 20.0000 mg | ORAL_TABLET | Freq: Every day | ORAL | 3 refills | Status: DC

## 2017-06-05 MED ORDER — SERTRALINE HCL 25 MG PO TABS: 25.0000 mg | ORAL_TABLET | Freq: Every day | ORAL | 3 refills | Status: DC

## 2017-06-05 NOTE — Progress Notes (Addendum)
Subjective:   Susan Holland is a 72 y.o. female who presents for Medicare Annual (Subsequent) preventive examination.  Review of Systems:  No ROS.  Medicare Wellness Visit. Additional risk factors are reflected in the social history.  Cardiac Risk Factors include: advanced age (>91men, >24 women);dyslipidemia Sleep patterns: gets up 1 times nightly to void and sleeps 5 hours nightly.  Patient reports insomnia issues, discussed recommended sleep tips and stress reduction tips, Relevant patient education assigned to patient using Emmi.  Home Safety/Smoke Alarms: Feels safe in home. Smoke alarms in place.  Living environment; residence and Firearm Safety: split level / walkout, no firearms. Lives alone, no needs for DME, good support system Seat Belt Safety/Bike Helmet: Wears seat belt.     Objective:     Vitals: BP (!) 148/85   Pulse 64   Temp 98.2 F (36.8 C)   Resp 18   Ht 5\' 5"  (1.651 m)   Wt 174 lb (78.9 kg)   SpO2 98%   BMI 28.96 kg/m   Body mass index is 28.96 kg/m.  Advanced Directives 06/05/2017  Does Patient Have a Medical Advance Directive? Yes  Type of Paramedic of [place];Living will    Tobacco Social History   Tobacco Use  Smoking Status Former Smoker  . Types: Cigarettes  . Last attempt to quit: 03/29/2015  . Years since quitting: 2.1  Smokeless Tobacco Never Used  Tobacco Comment   smoked 1963- present, up to 1/2 ppd     Counseling given: Not Answered Comment: smoked 1963- present, up to 1/2 ppd  Past Medical History:  Diagnosis Date  . Colon polyp   . Diverticulosis   . GERD (gastroesophageal reflux disease)   . Glaucoma suspect   . Hyperlipidemia    Past Surgical History:  Procedure Laterality Date  . CATARACT EXTRACTION Bilateral 2016  . COLONOSCOPY W/ POLYPECTOMY  03/2011   repeat 2020; Dr Olevia Perches  . 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 TONSILLECTOMY AND ADENOIDECTOMY    . TUBAL  LIGATION     Family History  Problem Relation Age of Onset  . Dementia Mother        Alsheimer's  . Stroke Father 88  . Heart disease Sister        AF  . Colon cancer Maternal Uncle        X 2  . Diabetes Neg Hx    Social History   Socioeconomic History  . Marital status: Widowed    Spouse name: Not on file  . Number of children: 2  . Years of education: Not on file  . Highest education level: Not on file  Occupational History  . Not on file  Social Needs  . Financial resource strain: Not hard at all  . Food insecurity:    Worry: Never true    Inability: Never true  . Transportation needs:    Medical: No    Non-medical: No  Tobacco Use  . Smoking status: Former Smoker    Types: Cigarettes    Last attempt to quit: 03/29/2015    Years since quitting: 2.1  . Smokeless tobacco: Never Used  . Tobacco comment: smoked 1963- present, up to 1/2 ppd  Substance and Sexual Activity  . Alcohol use: Yes    Alcohol/week: 0.0 oz    Comment:  occasionally   . Drug use: No  . Sexual activity: Never  Lifestyle  .  Physical activity:    Days per week: 0 days    Minutes per session: 0 min  . Stress: Not at all  Relationships  . Social connections:    Talks on phone: More than three times a week    Gets together: Not on file    Attends religious service: 1 to 4 times per year    Active member of club or organization: Yes    Attends meetings of clubs or organizations: More than 4 times per year    Relationship status: Widowed  Other Topics Concern  . Not on file  Social History Narrative   Exercise:  Does some walking, but irregularly    Outpatient Encounter Medications as of 06/05/2017  Medication Sig  . aspirin 81 MG tablet Take 160 mg by mouth daily.  . Calcium Carbonate-Vitamin D (CALCIUM + D PO) Take by mouth daily. Take 2 600 mg daily  . FIBER COMPLETE PO Take by mouth 2 (two) times daily.  . Multiple Vitamin (MULTIVITAMIN) tablet Take 1 tablet by mouth daily.  .  [DISCONTINUED] atorvastatin (LIPITOR) 20 MG tablet Take 1 tablet (20 mg total) by mouth daily.  . [DISCONTINUED] losartan-hydrochlorothiazide (HYZAAR) 100-12.5 MG tablet Take 1 tablet by mouth daily.  . [DISCONTINUED] sertraline (ZOLOFT) 25 MG tablet Take 1 tablet (25 mg total) by mouth daily.  . [DISCONTINUED] mirtazapine (REMERON) 15 MG tablet Take 1 tablet (15 mg total) by mouth at bedtime. (Patient not taking: Reported on 06/05/2017)   No facility-administered encounter medications on file as of 06/05/2017.     Activities of Daily Living In your present state of health, do you have any difficulty performing the following activities: 06/05/2017  Hearing? N  Vision? N  Difficulty concentrating or making decisions? N  Walking or climbing stairs? N  Dressing or bathing? N  Doing errands, shopping? N  Preparing Food and eating ? N  Using the Toilet? N  In the past six months, have you accidently leaked urine? N  Do you have problems with loss of bowel control? N  Managing your Medications? N  Managing your Finances? N  Housekeeping or managing your Housekeeping? N  Some recent data might be hidden    Patient Care Team: Binnie Rail, MD as PCP - General (Internal Medicine) Paula Compton, MD as Consulting Physician (Obstetrics and Gynecology)    Assessment:   This is a routine wellness examination for Susan Holland. Physical assessment deferred to PCP.   Exercise Activities and Dietary recommendations Current Exercise Habits: The patient does not participate in regular exercise at present, Exercise limited by: None identified  Diet (meal preparation, eat out, water intake, caffeinated beverages, dairy products, fruits and vegetables): in general, a "healthy" diet  , well balanced   Reviewed heart healthy diet, encouraged patient to increase daily water intake. Relevant patient education assigned to patient using Emmi. Discussed weight loss strategies, Diet education was provided via  handout.  Goals    . Patient Stated     I will begin to walk at the church 2-3 times weekly. Watch what I eat        Fall Risk Fall Risk  06/05/2017 05/17/2014 04/13/2013  Falls in the past year? Yes No No  Number falls in past yr: 1 - -  Follow up Falls prevention discussed - -    Depression Screen PHQ 2/9 Scores 06/05/2017 05/17/2014 04/13/2013  PHQ - 2 Score 1 0 0  PHQ- 9 Score 5 - -  Cognitive Function MMSE - Mini Mental State Exam 06/05/2017  Orientation to time 5  Orientation to Place 5  Registration 3  Attention/ Calculation 3  Recall 1  Language- name 2 objects 2  Language- repeat 1  Language- follow 3 step command 3  Language- read & follow direction 1  Write a sentence 1  Copy design 1  Total score 26        Immunization History  Administered Date(s) Administered  . Influenza Whole 10/29/2007  . Influenza, High Dose Seasonal PF 11/14/2016  . Influenza,inj,Quad PF,6+ Mos 11/14/2012  . Influenza-Unspecified 11/04/2013, 12/16/2014, 11/10/2015  . Pneumococcal Conjugate-13 10/05/2014  . Pneumococcal Polysaccharide-23 03/19/2011  . Td 03/17/2010  . Zoster 07/02/2006   Screening Tests Health Maintenance  Topic Date Due  . MAMMOGRAM  02/06/2016  . INFLUENZA VACCINE  09/05/2017  . DEXA SCAN  10/04/2017  . TETANUS/TDAP  03/17/2020  . COLONOSCOPY  03/12/2021  . Hepatitis C Screening  Completed  . PNA vac Low Risk Adult  Completed      Plan:     Continue doing brain stimulating activities (puzzles, reading, adult coloring books, staying active) to keep memory sharp.   Continue to eat heart healthy diet (full of fruits, vegetables, whole grains, lean protein, water--limit salt, fat, and sugar intake) and increase physical activity as tolerated.  I have personally reviewed and noted the following in the patient's chart:   . Medical and social history . Use of alcohol, tobacco or illicit drugs  . Current medications and supplements . Functional ability and  status . Nutritional status . Physical activity . Advanced directives . List of other physicians . Vitals . Screenings to include cognitive, depression, and falls . Referrals and appointments  In addition, I have reviewed and discussed with patient certain preventive protocols, quality metrics, and best practice recommendations. A written personalized care plan for preventive services as well as general preventive health recommendations were provided to patient.     Michiel Cowboy, RN  06/05/2017   Medical screening examination/treatment/procedure(s) were performed by non-physician practitioner and as supervising physician I was immediately available for consultation/collaboration. I agree with above. Binnie Rail, MD

## 2017-06-05 NOTE — Assessment & Plan Note (Signed)
Check a1c Low sugar / carb diet Stressed regular exercise, weight loss  

## 2017-06-05 NOTE — Assessment & Plan Note (Signed)
Still with difficulty sleeping but feels she gets enough No change in medications Start regular exercise

## 2017-06-05 NOTE — Assessment & Plan Note (Signed)
With some depression Husband died last year Continue sertraline at current dose Overall, she feels well - as expected sadness at times

## 2017-06-05 NOTE — Assessment & Plan Note (Signed)
BP elevated today, but ran out of medication a few days ago Resume current medication - sent to [facility] Monitor bp at home Start regular exercise Tsh, cmp, cbc

## 2017-06-05 NOTE — Assessment & Plan Note (Signed)
Check lipid panel,cmp ,tsh Continue daily statin Regular exercise and healthy diet encouraged  

## 2017-06-05 NOTE — Patient Instructions (Addendum)
Continue doing brain stimulating activities (puzzles, reading, adult coloring books, staying active) to keep memory sharp.   Continue to eat heart healthy diet (full of fruits, vegetables, whole grains, lean protein, water--limit salt, fat, and sugar intake) and increase physical activity as tolerated.   Susan Holland , Thank you for taking time to come for your Medicare Wellness Visit. I appreciate your ongoing commitment to your health goals. Please review the following plan we discussed and let me know if I can assist you in the future.   These are the goals we discussed: Goals    . Patient Stated     I will begin to walk at the church 2-3 times weekly. Watch what I eat        This is a list of the screening recommended for you and due dates:  Health Maintenance  Topic Date Due  . Mammogram  02/06/2016  . Flu Shot  09/05/2017  . DEXA scan (bone density measurement)  10/04/2017  . Tetanus Vaccine  03/17/2020  . Colon Cancer Screening  03/12/2021  .  Hepatitis C: One time screening is recommended by Center for Disease Control  (CDC) for  adults born from [birth year] through 1965.   Completed  . Pneumonia vaccines  Completed   It is important to avoid accidents which may result in broken bones.  Here are a few ideas on how to make your home safer so you will be less likely to trip or fall.  1. Use nonskid mats or non slip strips in your shower or tub, on your bathroom floor and around sinks.  If you know that you have spilled water, wipe it up! 2. In the bathroom, it is important to have properly installed grab bars on the walls or on the edge of the tub.  Towel racks are NOT strong enough for you to hold onto or to pull on for support. 3. Stairs and hallways should have enough light.  Add lamps or night lights if you need ore light. 4. It is good to have handrails on both sides of the stairs if possible.  Always fix broken handrails right away. 5. It is important to see the edges of steps.   Paint the edges of outdoor steps white so you can see them better.  Put colored tape on the edge of inside steps. 6. Throw-rugs are dangerous because they can slide.  Removing the rugs is the best idea, but if they must stay, add adhesive carpet tape to prevent slipping. 7. Do not keep things on stairs or in the halls.  Remove small furniture that blocks the halls as it may cause you to trip.  Keep telephone and electrical cords out of the way where you walk. 8. Always were sturdy, rubber-soled shoes for good support.  Never wear just socks, especially on the stairs.  Socks may cause you to slip or fall.  Do not wear full-length housecoats as you can easily trip on the bottom.  9. Place the things you use the most on the shelves that are the easiest to reach.  If you use a stepstool, make sure it is in good condition.  If you feel unsteady, DO NOT climb, ask for help. 10. If a health professional advises you to use a cane or walker, do not be ashamed.  These items can keep you from falling and breaking your bones. Health Maintenance, Female Adopting a healthy lifestyle and getting preventive care can go a long way to  promote health and wellness. Talk with your health care provider about what schedule of regular examinations is right for you. This is a good chance for you to check in with your provider about disease prevention and staying healthy. In between checkups, there are plenty of things you can do on your own. Experts have done a lot of research about which lifestyle changes and preventive measures are most likely to keep you healthy. Ask your health care provider for more information. Weight and diet Eat a healthy diet  Be sure to include plenty of vegetables, fruits, low-fat dairy products, and lean protein.  Do not eat a lot of foods high in solid fats, added sugars, or salt.  Get regular exercise. This is one of the most important things you can do for your health. ? Most adults should  exercise for at least 150 minutes each week. The exercise should increase your heart rate and make you sweat (moderate-intensity exercise). ? Most adults should also do strengthening exercises at least twice a week. This is in addition to the moderate-intensity exercise.  Maintain a healthy weight  Body mass index (BMI) is a measurement that can be used to identify possible weight problems. It estimates body fat based on height and weight. Your health care provider can help determine your BMI and help you achieve or maintain a healthy weight.  For females 23 years of age and older: ? A BMI below 18.5 is considered underweight. ? A BMI of 18.5 to 24.9 is normal. ? A BMI of 25 to 29.9 is considered overweight. ? A BMI of 30 and above is considered obese.  Watch levels of cholesterol and blood lipids  You should start having your blood tested for lipids and cholesterol at 72 years of age, then have this test every 5 years.  You may need to have your cholesterol levels checked more often if: ? Your lipid or cholesterol levels are high. ? You are older than 71 years of age. ? You are at high risk for heart disease.  Cancer screening Lung Cancer  Lung cancer screening is recommended for adults 47-70 years old who are at high risk for lung cancer because of a history of smoking.  A yearly low-dose CT scan of the lungs is recommended for people who: ? Currently smoke. ? Have quit within the past 15 years. ? Have at least a 30-pack-year history of smoking. A pack year is smoking an average of one pack of cigarettes a day for 1 year.  Yearly screening should continue until it has been 15 years since you quit.  Yearly screening should stop if you develop a health problem that would prevent you from having lung cancer treatment.  Breast Cancer  Practice breast self-awareness. This means understanding how your breasts normally appear and feel.  It also means doing regular breast  self-exams. Let your health care provider know about any changes, no matter how small.  If you are in your 20s or 30s, you should have a clinical breast exam (CBE) by a health care provider every 1-3 years as part of a regular health exam.  If you are 51 or older, have a CBE every year. Also consider having a breast X-ray (mammogram) every year.  If you have a family history of breast cancer, talk to your health care provider about genetic screening.  If you are at high risk for breast cancer, talk to your health care provider about having an MRI and a mammogram  every year.  Breast cancer gene (BRCA) assessment is recommended for women who have family members with BRCA-related cancers. BRCA-related cancers include: ? Breast. ? Ovarian. ? Tubal. ? Peritoneal cancers.  Results of the assessment will determine the need for genetic counseling and BRCA1 and BRCA2 testing.  Cervical Cancer Your health care provider may recommend that you be screened regularly for cancer of the pelvic organs (ovaries, uterus, and vagina). This screening involves a pelvic examination, including checking for microscopic changes to the surface of your cervix (Pap test). You may be encouraged to have this screening done every 3 years, beginning at age 66.  For women ages 20-65, health care providers may recommend pelvic exams and Pap testing every 3 years, or they may recommend the Pap and pelvic exam, combined with testing for human papilloma virus (HPV), every 5 years. Some types of HPV increase your risk of cervical cancer. Testing for HPV may also be done on women of any age with unclear Pap test results.  Other health care providers may not recommend any screening for nonpregnant women who are considered low risk for pelvic cancer and who do not have symptoms. Ask your health care provider if a screening pelvic exam is right for you.  If you have had past treatment for cervical cancer or a condition that could  lead to cancer, you need Pap tests and screening for cancer for at least 20 years after your treatment. If Pap tests have been discontinued, your risk factors (such as having a new sexual partner) need to be reassessed to determine if screening should resume. Some women have medical problems that increase the chance of getting cervical cancer. In these cases, your health care provider may recommend more frequent screening and Pap tests.  Colorectal Cancer  This type of cancer can be detected and often prevented.  Routine colorectal cancer screening usually begins at 72 years of age and continues through 72 years of age.  Your health care provider may recommend screening at an earlier age if you have risk factors for colon cancer.  Your health care provider may also recommend using home test kits to check for hidden blood in the stool.  A small camera at the end of a tube can be used to examine your colon directly (sigmoidoscopy or colonoscopy). This is done to check for the earliest forms of colorectal cancer.  Routine screening usually begins at age 70.  Direct examination of the colon should be repeated every 5-10 years through 73 years of age. However, you may need to be screened more often if early forms of precancerous polyps or small growths are found.  Skin Cancer  Check your skin from head to toe regularly.  Tell your health care provider about any new moles or changes in moles, especially if there is a change in a mole's shape or color.  Also tell your health care provider if you have a mole that is larger than the size of a pencil eraser.  Always use sunscreen. Apply sunscreen liberally and repeatedly throughout the day.  Protect yourself by wearing long sleeves, pants, a wide-brimmed hat, and sunglasses whenever you are outside.  Heart disease, diabetes, and high blood pressure  High blood pressure causes heart disease and increases the risk of stroke. High blood pressure  is more likely to develop in: ? People who have blood pressure in the high end of the normal range (130-139/85-89 mm Hg). ? People who are overweight or obese. ? People who  are African American.  If you are 40-43 years of age, have your blood pressure checked every 3-5 years. If you are 33 years of age or older, have your blood pressure checked every year. You should have your blood pressure measured twice-once when you are at a hospital or clinic, and once when you are not at a hospital or clinic. Record the average of the two measurements. To check your blood pressure when you are not at a hospital or clinic, you can use: ? An automated blood pressure machine at a pharmacy. ? A home blood pressure monitor.  If you are between 71 years and 29 years old, ask your health care provider if you should take aspirin to prevent strokes.  Have regular diabetes screenings. This involves taking a blood sample to check your fasting blood sugar level. ? If you are at a normal weight and have a low risk for diabetes, have this test once every three years after 72 years of age. ? If you are overweight and have a high risk for diabetes, consider being tested at a younger age or more often. Preventing infection Hepatitis B  If you have a higher risk for hepatitis B, you should be screened for this virus. You are considered at high risk for hepatitis B if: ? You were born in a country where hepatitis B is common. Ask your health care provider which countries are considered high risk. ? Your parents were born in a high-risk country, and you have not been immunized against hepatitis B (hepatitis B vaccine). ? You have HIV or AIDS. ? You use needles to inject street drugs. ? You live with someone who has hepatitis B. ? You have had sex with someone who has hepatitis B. ? You get hemodialysis treatment. ? You take certain medicines for conditions, including cancer, organ transplantation, and autoimmune  conditions.  Hepatitis C  Blood testing is recommended for: ? Everyone born from [birth year] through 1965. ? Anyone with known risk factors for hepatitis C.  Sexually transmitted infections (STIs)  You should be screened for sexually transmitted infections (STIs) including gonorrhea and chlamydia if: ? You are sexually active and are younger than 72 years of age. ? You are older than 72 years of age and your health care provider tells you that you are at risk for this type of infection. ? Your sexual activity has changed since you were last screened and you are at an increased risk for chlamydia or gonorrhea. Ask your health care provider if you are at risk.  If you do not have HIV, but are at risk, it may be recommended that you take a prescription medicine daily to prevent HIV infection. This is called pre-exposure prophylaxis (PrEP). You are considered at risk if: ? You are sexually active and do not regularly use condoms or know the HIV status of your partner(s). ? You take drugs by injection. ? You are sexually active with a partner who has HIV.  Talk with your health care provider about whether you are at high risk of being infected with HIV. If you choose to begin PrEP, you should first be tested for HIV. You should then be tested every 3 months for as long as you are taking PrEP. Pregnancy  If you are premenopausal and you may become pregnant, ask your health care provider about preconception counseling.  If you may become pregnant, take 400 to 800 micrograms (mcg) of folic acid every day.  If you want to  prevent pregnancy, talk to your health care provider about birth control (contraception). Osteoporosis and menopause  Osteoporosis is a disease in which the bones lose minerals and strength with aging. This can result in serious bone fractures. Your risk for osteoporosis can be identified using a bone density scan.  If you are 22 years of age or older, or if you are at risk for  osteoporosis and fractures, ask your health care provider if you should be screened.  Ask your health care provider whether you should take a calcium or vitamin D supplement to lower your risk for osteoporosis.  Menopause may have certain physical symptoms and risks.  Hormone replacement therapy may reduce some of these symptoms and risks. Talk to your health care provider about whether hormone replacement therapy is right for you. Follow these instructions at home:  Schedule regular health, dental, and eye exams.  Stay current with your immunizations.  Do not use any tobacco products including cigarettes, chewing tobacco, or electronic cigarettes.  If you are pregnant, do not drink alcohol.  If you are breastfeeding, limit how much and how often you drink alcohol.  Limit alcohol intake to no more than 1 drink per day for nonpregnant women. One drink equals 12 ounces of beer, 5 ounces of wine, or 1 ounces of hard liquor.  Do not use street drugs.  Do not share needles.  Ask your health care provider for help if you need support or information about quitting drugs.  Tell your health care provider if you often feel depressed.  Tell your health care provider if you have ever been abused or do not feel safe at home. This information is not intended to replace advice given to you by your health care provider. Make sure you discuss any questions you have with your health care provider. Document Released: 08/07/2010 Document Revised: 06/30/2015 Document Reviewed: 10/26/2014 Elsevier Interactive Patient Education  [employer].

## 2017-06-06 ENCOUNTER — Encounter: Payer: Self-pay | Admitting: Internal Medicine

## 2017-06-06 ENCOUNTER — Telehealth: Payer: Self-pay | Admitting: Internal Medicine

## 2017-06-06 NOTE — Telephone Encounter (Signed)
Copied from [place] [phone]. Topic: Quick Communication - See Telephone Encounter >> Jun 06, 2017 10:14 AM Ivar Drape wrote: CRM for notification. See Telephone encounter for: 06/06/17. Patient needs a prescription for the new Shingle Injection. She would like to have it at [facility] on [address] [place] [phone] but she needs a prescription for it.

## 2017-06-07 MED ORDER — ZOSTER VAC RECOMB ADJUVANTED 50 MCG/0.5ML IM SUSR: 0.5000 mL | Freq: Once | INTRAMUSCULAR | 1 refills | Status: AC

## 2017-06-20 ENCOUNTER — Encounter: Payer: Self-pay | Admitting: Internal Medicine

## 2017-11-16 NOTE — Patient Instructions (Addendum)
  Tests ordered today. Your results will be released to [place] (or called to you) after review, usually within 72hours after test completion. If any changes need to be made, you will be notified at that same time.   Medications reviewed and updated.  Changes include :   Discontinuing the aspirin and sertraline.     Please followup in 6 months

## 2017-11-16 NOTE — Progress Notes (Signed)
Subjective:    Patient ID: Susan Holland, female    DOB: 11/23/1945, 72 y.o.   MRN: [id number]  HPI The patient is here for follow up.  She is here with her daughter.    Falls:  She fell in March of this year and again recently.  The first time she fell she had a stepstool on fireplace hearth and 1 of the legs fell she fell backwards and hit her head.  She was not seen after this.  3 weeks ago she fell again and she had a hematoma to her lower leg that is resolving.  There is no head trauma with the second fall.  Her daughter is concerned about her increased risk of falling.  Hypertension: She is taking her medication daily. She is compliant with a low sodium diet.  She denies chest pain, palpitations, edema, shortness of breath and regular headaches. She is not exercising regularly.  She does not monitor her blood pressure at home.    Prediabetes:  She is not as compliant with a low sugar/carbohydrate diet.  She is not exercising regularly.  Hyperlipidemia: She is taking her medication daily. She is compliant with a low fat/cholesterol diet. She is not exercising regularly. She denies myalgias.   Grieving, Depression: She is taking her medication daily as prescribed. She denies any side effects from the medication.  She denies any depression or anxiety and does not feel that she needs the medication wondered if she could discontinue it.  She has full slightly elevated moods with medication would rather not being as well.  Constipation:  She has chronic constipation.  She has tried stool softeners and over-the-counter medications but have never taken them consistently.  Her daughter wondered if something like Linzess or Amitiza was effective.  She is currently not taking a stool softener on a regular basis or supplemental fiber.  Medications and allergies reviewed with patient and updated if appropriate.  Patient Active Problem List   Diagnosis Date Noted  . Prediabetes 06/05/2017  .  Difficulty sleeping 05/21/2016  . Grieving 05/21/2016  . Nonspecific abnormal electrocardiogram (ECG) (EKG) 05/18/2014  . Essential hypertension 05/17/2014  . Glaucoma suspect 05/04/2013  . Skin cancer 03/19/2011  . Osteopenia 03/19/2011  . History of colonic polyps 03/17/2010  . HYPERLIPIDEMIA 03/06/2007    Current Outpatient Medications on File Prior to Visit  Medication Sig Dispense Refill  . aspirin 81 MG tablet Take 160 mg by mouth daily.    Marland Kitchen atorvastatin (LIPITOR) 20 MG tablet Take 1 tablet (20 mg total) by mouth daily. 90 tablet 3  . Calcium Carbonate-Vitamin D (CALCIUM + D PO) Take by mouth daily. Take 2 600 mg daily    . FIBER COMPLETE PO Take by mouth 2 (two) times daily.    Marland Kitchen losartan-hydrochlorothiazide (HYZAAR) 100-12.5 MG tablet Take 1 tablet by mouth daily. 90 tablet 3  . Multiple Vitamin (MULTIVITAMIN) tablet Take 1 tablet by mouth daily.    . sertraline (ZOLOFT) 25 MG tablet Take 1 tablet (25 mg total) by mouth daily. 90 tablet 3   No current facility-administered medications on file prior to visit.     Past Medical History:  Diagnosis Date  . Colon polyp   . Diverticulosis   . GERD (gastroesophageal reflux disease)   . Glaucoma suspect   . Hyperlipidemia     Past Surgical History:  Procedure Laterality Date  . CATARACT EXTRACTION Bilateral 2016  . COLONOSCOPY W/ POLYPECTOMY  03/2011   repeat  2020; Dr Olevia Perches  . 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 TONSILLECTOMY AND ADENOIDECTOMY    . TUBAL LIGATION      Social History   Socioeconomic History  . Marital status: Widowed    Spouse name: Not on file  . Number of children: 2  . Years of education: Not on file  . Highest education level: Not on file  Occupational History  . Not on file  Social Needs  . Financial resource strain: Not hard at all  . Food insecurity:    Worry: Never true    Inability: Never true  . Transportation needs:    Medical: No    Non-medical: No    Tobacco Use  . Smoking status: Former Smoker    Types: Cigarettes    Last attempt to quit: 03/29/2015    Years since quitting: 2.6  . Smokeless tobacco: Never Used  . Tobacco comment: smoked 1963- present, up to 1/2 ppd  Substance and Sexual Activity  . Alcohol use: Yes    Alcohol/week: 0.0 standard drinks    Comment:  occasionally   . Drug use: No  . Sexual activity: Never  Lifestyle  . Physical activity:    Days per week: 0 days    Minutes per session: 0 min  . Stress: Not at all  Relationships  . Social connections:    Talks on phone: More than three times a week    Gets together: Not on file    Attends religious service: 1 to 4 times per year    Active member of club or organization: Yes    Attends meetings of clubs or organizations: More than 4 times per year    Relationship status: Widowed  Other Topics Concern  . Not on file  Social History Narrative   Exercise:  Does some walking, but irregularly    Family History  Problem Relation Age of Onset  . Dementia Mother        Alsheimer's  . Stroke Father 21  . Heart disease Sister        AF  . Colon cancer Maternal Uncle        X 2  . Diabetes Neg Hx     Review of Systems  Constitutional: Negative for chills, fatigue and fever.  Respiratory: Negative for cough, shortness of breath and wheezing.   Cardiovascular: Negative for chest pain, palpitations and leg swelling.  Neurological: Negative for dizziness, light-headedness and headaches.       Objective:   Vitals:   11/18/17 0750  BP: 132/78  Pulse: 67  Resp: 16  Temp: 98.7 F (37.1 C)  SpO2: 96%   BP Readings from Last 3 Encounters:  11/18/17 132/78  06/05/17 (!) 148/85  06/05/17 (!) 148/85   Wt Readings from Last 3 Encounters:  11/18/17 180 lb (81.6 kg)  06/05/17 174 lb (78.9 kg)  06/05/17 174 lb (78.9 kg)   Body mass index is 29.95 kg/m.   Physical Exam    Constitutional: Appears well-developed and well-nourished. No distress.  HENT:   Head: Normocephalic and atraumatic.  Neck: Neck supple. No tracheal deviation present. No thyromegaly present.  No cervical lymphadenopathy Cardiovascular: Normal rate, regular rhythm and normal heart sounds.   No murmur heard. No carotid bruit .  No edema Pulmonary/Chest: Effort normal and breath sounds normal. No respiratory distress. No has no wheezes. No rales.  Skin: Skin is warm and dry.  Not diaphoretic.  Psychiatric: Normal mood and affect. Behavior is normal.      Assessment & Plan:    See Problem List for Assessment and Plan of chronic medical problems.

## 2017-11-18 ENCOUNTER — Encounter: Payer: Self-pay | Admitting: Internal Medicine

## 2017-11-18 ENCOUNTER — Other Ambulatory Visit (INDEPENDENT_AMBULATORY_CARE_PROVIDER_SITE_OTHER): Payer: PPO

## 2017-11-18 ENCOUNTER — Ambulatory Visit (INDEPENDENT_AMBULATORY_CARE_PROVIDER_SITE_OTHER): Payer: PPO | Admitting: Internal Medicine

## 2017-11-18 VITALS — BP 132/78 | HR 67 | Temp 98.7°F | Resp 16 | Ht 65.0 in | Wt 180.0 lb

## 2017-11-18 DIAGNOSIS — M8589 Other specified disorders of bone density and structure, multiple sites: Secondary | ICD-10-CM | POA: Diagnosis not present

## 2017-11-18 DIAGNOSIS — E782 Mixed hyperlipidemia: Secondary | ICD-10-CM

## 2017-11-18 DIAGNOSIS — F4321 Adjustment disorder with depressed mood: Secondary | ICD-10-CM | POA: Diagnosis not present

## 2017-11-18 DIAGNOSIS — R7303 Prediabetes: Secondary | ICD-10-CM

## 2017-11-18 DIAGNOSIS — I10 Essential (primary) hypertension: Secondary | ICD-10-CM | POA: Diagnosis not present

## 2017-11-18 LAB — COMPREHENSIVE METABOLIC PANEL
ALT: 18 U/L (ref 0–35)
AST: 16 U/L (ref 0–37)
Albumin: 4.5 g/dL (ref 3.5–5.2)
Alkaline Phosphatase: 58 U/L (ref 39–117)
BUN: 12 mg/dL (ref 6–23)
CALCIUM: 10.3 mg/dL (ref 8.4–10.5)
CHLORIDE: 97 meq/L (ref 96–112)
CO2: 32 mEq/L (ref 19–32)
Creatinine, Ser: 0.74 mg/dL (ref 0.40–1.20)
GFR: 81.85 mL/min (ref >60.00)
Glucose, Bld: 105 mg/dL — ABNORMAL HIGH (ref 70–99)
POTASSIUM: 4.4 meq/L (ref 3.5–5.1)
SODIUM: 135 meq/L (ref 135–145)
Total Bilirubin: 0.6 mg/dL (ref 0.2–1.2)
Total Protein: 7.6 g/dL (ref 6.0–8.3)

## 2017-11-18 LAB — HEMOGLOBIN A1C: Hgb A1c MFr Bld: 6.1 % (ref 4.6–6.5)

## 2017-11-18 NOTE — Assessment & Plan Note (Signed)
Overall doing well Has good family support Denies any depression or anxiety Will discontinue sertraline

## 2017-11-18 NOTE — Assessment & Plan Note (Signed)
Check a1c Low sugar / carb diet Stressed regular exercise, weight loss  

## 2017-11-18 NOTE — Assessment & Plan Note (Signed)
BP well controlled Current regimen effective and well tolerated Continue current medications at current doses cmp  

## 2017-11-18 NOTE — Assessment & Plan Note (Signed)
Last bone density 2016-she is due now DEXA done downstairs-ordered Taking calcium and vitamin D-continue Not currently exercising regularly-stressed regular exercise Discussed fall prevention

## 2017-11-18 NOTE — Assessment & Plan Note (Signed)
Lipids have been well controlled-recheck in 6 months Continue daily statin Regular exercise and healthy diet encouraged

## 2017-11-26 ENCOUNTER — Ambulatory Visit (INDEPENDENT_AMBULATORY_CARE_PROVIDER_SITE_OTHER)
Admission: RE | Admit: 2017-11-26 | Discharge: 2017-11-26 | Disposition: A | Discharge status: Home / Self Care | Payer: PPO | Source: Ambulatory Visit

## 2017-11-26 DIAGNOSIS — M8589 Other specified disorders of bone density and structure, multiple sites: Secondary | ICD-10-CM

## 2017-11-29 ENCOUNTER — Encounter: Payer: Self-pay | Admitting: Internal Medicine

## 2017-11-29 DIAGNOSIS — M810 Age-related osteoporosis without current pathological fracture: Secondary | ICD-10-CM | POA: Insufficient documentation

## 2017-12-06 ENCOUNTER — Ambulatory Visit: Payer: PPO | Admitting: Internal Medicine

## 2018-01-10 ENCOUNTER — Telehealth: Payer: Self-pay | Admitting: Internal Medicine

## 2018-01-10 NOTE — Telephone Encounter (Signed)
Patient's tscore is -2.6---, are you ok with patient starting prolia injections---routing to dr burns, please advise, I will verify insurance and call patient back, thanks

## 2018-01-10 NOTE — Telephone Encounter (Signed)
Copied from [place] [phone]. Topic: General - Inquiry >> Jan 10, 2018 12:07 PM Vernona Rieger wrote: Reason for CRM: Pt states she had a bone density on 10/25 and is inquiring about getting a prolia injection. She would like the nurse to give her a call back to set that up

## 2018-01-10 NOTE — Telephone Encounter (Signed)
Can you help in getting the order that is needing and I can verify benefits?

## 2018-01-11 NOTE — Telephone Encounter (Signed)
Yes, I recommended prolia

## 2018-01-13 NOTE — Telephone Encounter (Signed)
Routing to [place]---please verify insurance for prolia, I will call patient back to discuss summary of benefits

## 2018-01-15 NOTE — Telephone Encounter (Signed)
Submitted to the portal.  Waiting for verification.

## 2018-02-11 DIAGNOSIS — L819 Disorder of pigmentation, unspecified: Secondary | ICD-10-CM | POA: Diagnosis not present

## 2018-02-11 DIAGNOSIS — D229 Melanocytic nevi, unspecified: Secondary | ICD-10-CM | POA: Diagnosis not present

## 2018-02-11 DIAGNOSIS — Z85828 Personal history of other malignant neoplasm of skin: Secondary | ICD-10-CM | POA: Diagnosis not present

## 2018-02-11 DIAGNOSIS — L570 Actinic keratosis: Secondary | ICD-10-CM | POA: Diagnosis not present

## 2018-02-11 DIAGNOSIS — L821 Other seborrheic keratosis: Secondary | ICD-10-CM | POA: Diagnosis not present

## 2018-02-11 DIAGNOSIS — L814 Other melanin hyperpigmentation: Secondary | ICD-10-CM | POA: Diagnosis not present

## 2018-03-17 ENCOUNTER — Telehealth: Payer: Self-pay | Admitting: Internal Medicine

## 2018-03-17 NOTE — Telephone Encounter (Signed)
Insurance has been submitted and verified for Prolia. Patient is responsible for a $255 copay. Due anytime. Spoke to patient. She would like to get this injection but can not afford the copayment. I gave the patient [employer] information. She will contact them to enrol and touch base with [place] once that has been done.

## 2018-03-18 NOTE — Telephone Encounter (Signed)
[employer] advised Pt that they need to know exactly what her diagnosis was to give the Pt any assistance. Susan Holland advise

## 2018-03-18 NOTE — Telephone Encounter (Signed)
Osteoporosis

## 2018-03-18 NOTE — Telephone Encounter (Signed)
Patient has been approved for [employer] for $1,000 and is enrolled from 02/16/2018 through 02/16/2019. [employer] Identification Number: [id number]  Jonelle Sidle, can you send this into the pharmacy for her so that she can use her [employer] benefits?  Pharmacy: [facility]  Once this has been sent, I will call the patient to schedule an appointment.

## 2018-03-24 ENCOUNTER — Telehealth: Payer: Self-pay

## 2018-03-24 MED ORDER — DENOSUMAB 60 MG/ML ~~LOC~~ SOSY: 60.0000 mg | PREFILLED_SYRINGE | Freq: Once | SUBCUTANEOUS | 0 refills | Status: DC

## 2018-03-24 NOTE — Telephone Encounter (Signed)
I have advised patient that prolia rx has been sent to her pharmacy, her pharmacy should let her know when to pick up, she is to take [employer] paperwork to her pharmacy that will pay for cost of med, she needs to make nurse visit and bring prolia injection to [place] office for administration, keep prolia med cold until she comes---can talk with Everly Rubalcava if any further questions

## 2018-03-24 NOTE — Telephone Encounter (Signed)
error 

## 2018-03-24 NOTE — Telephone Encounter (Signed)
Patient has received paperwork. Can this be sent in to her pharmacy?

## 2018-03-26 ENCOUNTER — Ambulatory Visit (INDEPENDENT_AMBULATORY_CARE_PROVIDER_SITE_OTHER): Payer: PPO

## 2018-03-26 DIAGNOSIS — M810 Age-related osteoporosis without current pathological fracture: Secondary | ICD-10-CM

## 2018-03-26 MED ORDER — DENOSUMAB 60 MG/ML ~~LOC~~ SOSY
60.0000 mg | PREFILLED_SYRINGE | Freq: Once | SUBCUTANEOUS | Status: AC
  Administered 2018-03-26: 60 mg via SUBCUTANEOUS

## 2018-03-26 NOTE — Progress Notes (Signed)
Medical screening examination/treatment/procedure(s) were performed by non-physician practitioner and as supervising physician I was immediately available for consultation/collaboration. I agree with above. James John, MD   

## 2018-05-13 ENCOUNTER — Other Ambulatory Visit: Payer: Self-pay | Admitting: Internal Medicine

## 2018-05-13 MED ORDER — ATORVASTATIN CALCIUM 20 MG PO TABS: 20.0000 mg | ORAL_TABLET | Freq: Every day | ORAL | 0 refills | Status: DC

## 2018-05-13 MED ORDER — LOSARTAN POTASSIUM-HCTZ 100-12.5 MG PO TABS: 1.0000 | ORAL_TABLET | Freq: Every day | ORAL | 0 refills | Status: DC

## 2018-05-13 NOTE — Telephone Encounter (Signed)
Requested Prescriptions  Pending Prescriptions Disp Refills  . atorvastatin (LIPITOR) 20 MG tablet 90 tablet 0    Sig: Take 1 tablet (20 mg total) by mouth daily.     Cardiovascular:  Antilipid - Statins Passed - 05/13/2018  6:04 AM      Passed - Total Cholesterol in normal range and within 360 days    Cholesterol, Total  Date Value Ref Range Status  05/17/2014 234 (H) 100 - 199 mg/dL Final   Cholesterol  Date Value Ref Range Status  06/05/2017 154 0 - 200 mg/dL Final    Comment:    ATP III Classification       Desirable:  < 200 mg/dL               Borderline High:  200 - 239 mg/dL          High:  > = 240 mg/dL         Passed - LDL in normal range and within 360 days    LDL (calc)  Date Value Ref Range Status  05/17/2014 155 (H) 0 - 99 mg/dL Final    Comment:                              Optimal               <  100                           Above optimal     100 -  129                           Borderline        130 -  159                           High              160 -  189                           Very high             >  189 LDL-C is inaccurate if patient is non-fasting.    LDL Cholesterol  Date Value Ref Range Status  06/05/2017 78 0 - 99 mg/dL Final         Passed - HDL in normal range and within 360 days    HDL-C  Date Value Ref Range Status  05/17/2014 63 >39 mg/dL Final   HDL  Date Value Ref Range Status  06/05/2017 63.00 >39.00 mg/dL Final         Passed - Triglycerides in normal range and within 360 days    Triglycerides  Date Value Ref Range Status  06/05/2017 69.0 0.0 - 149.0 mg/dL Final    Comment:    Normal:  <150 mg/dLBorderline High:  150 - 199 mg/dL  05/17/2014 78 0 - 149 mg/dL Final         Passed - Patient is not pregnant      Passed - Valid encounter within last 12 months    Recent Outpatient Visits          5 months ago Essential hypertension   [place], Claudina Lick, MD  11 months ago Preventative  health care   [place], Claudina Lick, MD   1 year ago [place], MD   1 year ago Preventative health care   [place], MD   2 years ago Preventative health care   [place], MD      Future Appointments            In 4 weeks  [place], [place]   In 4 weeks Binnie Rail, MD [place], [facility]         . losartan-hydrochlorothiazide (HYZAAR) 100-12.5 MG tablet 90 tablet 0    Sig: Take 1 tablet by mouth daily.     Cardiovascular: ARB + Diuretic Combos Passed - 05/13/2018  6:04 AM      Passed - K in normal range and within 180 days    Potassium  Date Value Ref Range Status  11/18/2017 4.4 3.5 - 5.1 mEq/L Final         Passed - Na in normal range and within 180 days    Sodium  Date Value Ref Range Status  11/18/2017 135 135 - 145 mEq/L Final         Passed - Cr in normal range and within 180 days    Creatinine, Ser  Date Value Ref Range Status  11/18/2017 0.74 0.40 - 1.20 mg/dL Final         Passed - Ca in normal range and within 180 days    Calcium  Date Value Ref Range Status  11/18/2017 10.3 8.4 - 10.5 mg/dL Final         Passed - Patient is not pregnant      Passed - Last BP in normal range    BP Readings from Last 1 Encounters:  11/18/17 132/78         Passed - Valid encounter within last 6 months    Recent Outpatient Visits          5 months ago Essential hypertension   [place], Claudina Lick, MD   11 months ago Preventative health care   [place], Claudina Lick, MD   1 year ago [place], Claudina Lick, MD   1 year ago Preventative health care   [place], Claudina Lick, MD   2 years ago Preventative  health care   [place], Claudina Lick, MD      Future Appointments            In 4 weeks  [place], [place]   In 4 weeks [place], Claudina Lick, MD [place], [facility]

## 2018-05-15 ENCOUNTER — Other Ambulatory Visit: Payer: Self-pay | Admitting: Internal Medicine

## 2018-05-15 MED ORDER — TELMISARTAN-HCTZ 80-12.5 MG PO TABS: 1.0000 | ORAL_TABLET | Freq: Every day | ORAL | 1 refills | Status: DC

## 2018-06-10 ENCOUNTER — Encounter: Payer: PPO | Admitting: Internal Medicine

## 2018-06-10 ENCOUNTER — Ambulatory Visit: Payer: PPO

## 2018-07-02 DIAGNOSIS — H35373 Puckering of macula, bilateral: Secondary | ICD-10-CM | POA: Diagnosis not present

## 2018-07-02 DIAGNOSIS — H35033 Hypertensive retinopathy, bilateral: Secondary | ICD-10-CM | POA: Diagnosis not present

## 2018-07-02 DIAGNOSIS — H35371 Puckering of macula, right eye: Secondary | ICD-10-CM | POA: Diagnosis not present

## 2018-07-02 DIAGNOSIS — H40013 Open angle with borderline findings, low risk, bilateral: Secondary | ICD-10-CM | POA: Diagnosis not present

## 2018-07-16 ENCOUNTER — Other Ambulatory Visit: Payer: Self-pay

## 2018-07-16 ENCOUNTER — Ambulatory Visit (INDEPENDENT_AMBULATORY_CARE_PROVIDER_SITE_OTHER): Payer: PPO | Admitting: Internal Medicine

## 2018-07-16 ENCOUNTER — Encounter: Payer: Self-pay | Admitting: Internal Medicine

## 2018-07-16 VITALS — BP 134/72 | HR 68 | Temp 98.3°F | Resp 16 | Ht 65.0 in | Wt 185.0 lb

## 2018-07-16 DIAGNOSIS — R1031 Right lower quadrant pain: Secondary | ICD-10-CM | POA: Insufficient documentation

## 2018-07-16 MED ORDER — MELOXICAM 15 MG PO TABS: 15.0000 mg | ORAL_TABLET | Freq: Every day | ORAL | 0 refills | Status: DC

## 2018-07-16 NOTE — Progress Notes (Signed)
Subjective:    Patient ID: Susan Holland, female    DOB: 09-May-1945, 73 y.o.   MRN: [id number]  HPI The patient is here for an acute visit.   Right leg pain:  It started one month ago.  She denies any injuries or activities that caused the pain.  It is intermittent.  The pain is in the groin area and goes to the knee and sometimes it is in the upper posterior thigh.  She has the pain with walking, sitting.  She does not feel the pain with sleeping.  Nothing really makes it better or worse.  She feels her knee wants to lock up at times.  She denies weakness or numbness/tingling in the right leg.  She has noticed pain across her lower back when she works in her yard.     She has tried aleve and tylenol and it helps for a while.      Medications and allergies reviewed with patient and updated if appropriate.  Patient Active Problem List   Diagnosis Date Noted  . Osteoporosis 11/29/2017  . Prediabetes 06/05/2017  . Difficulty sleeping 05/21/2016  . Grieving 05/21/2016  . Nonspecific abnormal electrocardiogram (ECG) (EKG) 05/18/2014  . Essential hypertension 05/17/2014  . Glaucoma suspect 05/04/2013  . Skin cancer 03/19/2011  . Osteopenia 03/19/2011  . History of colonic polyps 03/17/2010  . HYPERLIPIDEMIA 03/06/2007    Current Outpatient Medications on File Prior to Visit  Medication Sig Dispense Refill  . atorvastatin (LIPITOR) 20 MG tablet Take 1 tablet (20 mg total) by mouth daily. 90 tablet 0  . Calcium Carbonate-Vitamin D (CALCIUM + D PO) Take by mouth daily. Take 2 600 mg daily    . FIBER COMPLETE PO Take by mouth 2 (two) times daily.    . Multiple Vitamin (MULTIVITAMIN) tablet Take 1 tablet by mouth daily.    Marland Kitchen telmisartan-hydrochlorothiazide (MICARDIS HCT) 80-12.5 MG tablet Take 1 tablet by mouth daily. 90 tablet 1   No current facility-administered medications on file prior to visit.     Past Medical History:  Diagnosis Date  . Colon polyp   . Diverticulosis    . GERD (gastroesophageal reflux disease)   . Glaucoma suspect   . Hyperlipidemia     Past Surgical History:  Procedure Laterality Date  . CATARACT EXTRACTION Bilateral 2016  . COLONOSCOPY W/ POLYPECTOMY  03/2011   repeat 2020; Dr Olevia Perches  . 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 TONSILLECTOMY AND ADENOIDECTOMY    . TUBAL LIGATION      Social History   Socioeconomic History  . Marital status: Widowed    Spouse name: Not on file  . Number of children: 2  . Years of education: Not on file  . Highest education level: Not on file  Occupational History  . Not on file  Social Needs  . Financial resource strain: Not hard at all  . Food insecurity:    Worry: Never true    Inability: Never true  . Transportation needs:    Medical: No    Non-medical: No  Tobacco Use  . Smoking status: Former Smoker    Types: Cigarettes    Last attempt to quit: 03/29/2015    Years since quitting: 3.3  . Smokeless tobacco: Never Used  . Tobacco comment: smoked 1963- present, up to 1/2 ppd  Substance and Sexual Activity  . Alcohol use: Yes    Alcohol/week: 0.0  standard drinks    Comment:  occasionally   . Drug use: No  . Sexual activity: Never  Lifestyle  . Physical activity:    Days per week: 0 days    Minutes per session: 0 min  . Stress: Not at all  Relationships  . Social connections:    Talks on phone: More than three times a week    Gets together: Not on file    Attends religious service: 1 to 4 times per year    Active member of club or organization: Yes    Attends meetings of clubs or organizations: More than 4 times per year    Relationship status: Widowed  Other Topics Concern  . Not on file  Social History Narrative   Exercise:  Does some walking, but irregularly    Family History  Problem Relation Age of Onset  . Dementia Mother        Alsheimer's  . Stroke Father 74  . Heart disease Sister        AF  . Colon cancer Maternal Uncle        X 2   . Diabetes Neg Hx     Review of Systems  Constitutional: Negative for chills and fever.  Musculoskeletal: Positive for back pain (Across her lower back while working in the yard only).       Right groin pain  Neurological: Negative for weakness and numbness.       Objective:   Vitals:   07/16/18 1542  BP: 134/72  Pulse: 68  Resp: 16  Temp: 98.3 F (36.8 C)  SpO2: 97%   BP Readings from Last 3 Encounters:  07/16/18 134/72  11/18/17 132/78  06/05/17 (!) 148/85   Wt Readings from Last 3 Encounters:  07/16/18 185 lb (83.9 kg)  11/18/17 180 lb (81.6 kg)  06/05/17 174 lb (78.9 kg)   Body mass index is 30.79 kg/m.   Physical Exam Constitutional:      Appearance: Normal appearance. She is normal weight.  HENT:     Head: Normocephalic and atraumatic.  Musculoskeletal:     Comments: No pain with palpation across lower back or lumbar spine.  Slight tenderness lateral right hip, tenderness with palpation in the right groin.  Skin:    General: Skin is warm and dry.  Neurological:     Mental Status: She is alert.     Sensory: No sensory deficit.     Motor: No weakness.     Gait: Gait abnormal (Limp secondary to right leg/groin pain).            Assessment & Plan:    See Problem List for Assessment and Plan of chronic medical problems.

## 2018-07-16 NOTE — Patient Instructions (Signed)
Have an x-ray today  Start taking meloxicam ( anti-inflammatory) once a day - take with food.  You can take tylenol as needed, but NO advil or aleve.   Make an appointment with Dr Tamala Julian ( sports medicine ) in our office.

## 2018-07-16 NOTE — Assessment & Plan Note (Signed)
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

## 2018-07-17 ENCOUNTER — Ambulatory Visit (INDEPENDENT_AMBULATORY_CARE_PROVIDER_SITE_OTHER)
Admission: RE | Admit: 2018-07-17 | Discharge: 2018-07-17 | Disposition: A | Discharge status: Home / Self Care | Payer: PPO | Source: Ambulatory Visit | Attending: Internal Medicine | Admitting: Internal Medicine

## 2018-07-17 DIAGNOSIS — R1031 Right lower quadrant pain: Secondary | ICD-10-CM

## 2018-07-17 DIAGNOSIS — M1611 Unilateral primary osteoarthritis, right hip: Secondary | ICD-10-CM | POA: Diagnosis not present

## 2018-07-31 ENCOUNTER — Encounter: Payer: Self-pay | Admitting: Family Medicine

## 2018-07-31 ENCOUNTER — Ambulatory Visit (INDEPENDENT_AMBULATORY_CARE_PROVIDER_SITE_OTHER)
Admission: RE | Admit: 2018-07-31 | Discharge: 2018-07-31 | Disposition: A | Discharge status: Home / Self Care | Payer: PPO | Source: Ambulatory Visit | Attending: Family Medicine | Admitting: Family Medicine

## 2018-07-31 ENCOUNTER — Other Ambulatory Visit: Payer: Self-pay

## 2018-07-31 ENCOUNTER — Ambulatory Visit (INDEPENDENT_AMBULATORY_CARE_PROVIDER_SITE_OTHER): Payer: PPO | Admitting: Family Medicine

## 2018-07-31 VITALS — BP 128/84 | HR 62 | Ht 65.0 in

## 2018-07-31 DIAGNOSIS — M816 Localized osteoporosis [Lequesne]: Secondary | ICD-10-CM | POA: Diagnosis not present

## 2018-07-31 DIAGNOSIS — M545 Low back pain, unspecified: Secondary | ICD-10-CM

## 2018-07-31 DIAGNOSIS — R1031 Right lower quadrant pain: Secondary | ICD-10-CM | POA: Diagnosis not present

## 2018-07-31 MED ORDER — TRAMADOL HCL 50 MG PO TABS: 50.0000 mg | ORAL_TABLET | Freq: Two times a day (BID) | ORAL | 0 refills | Status: AC | PRN

## 2018-07-31 NOTE — Progress Notes (Signed)
[facility] [address] [place], [place] [address] Phone: [phone] Subjective:   [name], am serving as a scribe for Dr. Hulan Saas.   CC:  Right groin pain   [id number]  Susan Holland is a 73 y.o. female coming in with complaint of right groin pain that radiates down her leg. Pain for 4 weeks but has gotten worse recently. No mechanism of injury. Constant pain. Has been using Tylenol for Arthritis and Meloxicam. Neither have helped.      Past Medical History:  Diagnosis Date  . Colon polyp   . Diverticulosis   . GERD (gastroesophageal reflux disease)   . Glaucoma suspect   . Hyperlipidemia    Past Surgical History:  Procedure Laterality Date  . CATARACT EXTRACTION Bilateral 2016  . COLONOSCOPY W/ POLYPECTOMY  03/2011   repeat 2020; Dr Olevia Perches  . 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 TONSILLECTOMY AND ADENOIDECTOMY    . TUBAL LIGATION     Social History   Socioeconomic History  . Marital status: Widowed    Spouse name: Not on file  . Number of children: 2  . Years of education: Not on file  . Highest education level: Not on file  Occupational History  . Not on file  Social Needs  . Financial resource strain: Not hard at all  . Food insecurity    Worry: Never true    Inability: Never true  . Transportation needs    Medical: No    Non-medical: No  Tobacco Use  . Smoking status: Former Smoker    Types: Cigarettes    Quit date: 03/29/2015    Years since quitting: 3.3  . Smokeless tobacco: Never Used  . Tobacco comment: smoked 1963- present, up to 1/2 ppd  Substance and Sexual Activity  . Alcohol use: Yes    Alcohol/week: 0.0 standard drinks    Comment:  occasionally   . Drug use: No  . Sexual activity: Never  Lifestyle  . Physical activity    Days per week: 0 days    Minutes per session: 0 min  . Stress: Not at all  Relationships  . Social Herbalist on phone: More than  three times a week    Gets together: Not on file    Attends religious service: 1 to 4 times per year    Active member of club or organization: Yes    Attends meetings of clubs or organizations: More than 4 times per year    Relationship status: Widowed  Other Topics Concern  . Not on file  Social History Narrative   Exercise:  Does some walking, but irregularly   No Known Allergies Family History  Problem Relation Age of Onset  . Dementia Mother        Alsheimer's  . Stroke Father 64  . Heart disease Sister        AF  . Colon cancer Maternal Uncle        X 2  . Diabetes Neg Hx      Current Outpatient Medications (Cardiovascular):  .  atorvastatin (LIPITOR) 20 MG tablet, Take 1 tablet (20 mg total) by mouth daily. Marland Kitchen  telmisartan-hydrochlorothiazide (MICARDIS HCT) 80-12.5 MG tablet, Take 1 tablet by mouth daily.   Current Outpatient Medications (Analgesics):  .  meloxicam (MOBIC) 15 MG tablet, Take 1 tablet (15 mg total) by mouth  daily. Take with food. .  traMADol (ULTRAM) 50 MG tablet, Take 1 tablet (50 mg total) by mouth every 12 (twelve) hours as needed for up to 5 days.   Current Outpatient Medications (Other):  Marland Kitchen  Calcium Carbonate-Vitamin D (CALCIUM + D PO), Take by mouth daily. Take 2 600 mg daily .  FIBER COMPLETE PO, Take by mouth 2 (two) times daily. .  Multiple Vitamin (MULTIVITAMIN) tablet, Take 1 tablet by mouth daily.    Past medical history, social, surgical and family history all reviewed in electronic medical record.  No pertanent information unless stated regarding to the chief complaint.   Review of Systems:  No headache, visual changes, nausea, vomiting, diarrhea, constipation, dizziness, abdominal pain, skin rash, fevers, chills, night sweats, weight loss, swollen lymph nodes, body aches, joint swelling, muscle aches, chest pain, shortness of breath, mood changes. Positive muscle aches   Objective  Blood pressure 128/84, pulse 62, height 5\' 5"  (1.651  m), SpO2 97 %.   General: No apparent distress alert and oriented x3 mood and affect normal, dressed appropriately.  HEENT: Pupils equal, extraocular movements intact  Respiratory: Patient's speak in full sentences and does not appear short of breath  Cardiovascular: No lower extremity edema, non tender, no erythema  Skin: Warm dry intact with no signs of infection or rash on extremities or on axial skeleton.  Abdomen: Soft nontender  Neuro: Cranial nerves II through XII are intact, neurovascularly intact in all extremities with 2+ DTRs and 2+ pulses.  Lymph: No lymphadenopathy of posterior or anterior cervical chain or axillae bilaterally.  Gait severe antalgic  MSK:  tender with full range of motion and good stability and symmetric strength and tone of shoulders, elbows, wrist, knee and ankles bilaterally.  Right hip- limited ROM. Only 5 degrees internal. Positive fulcrum.  Pain with all movement. NVI distally difficulty with strength       Impression and Recommendations:     This case required medical decision making of moderate complexity. The above documentation has been reviewed and is accurate and complete Lyndal Pulley, DO       Note: This dictation was prepared with Dragon dictation along with smaller phrase technology. Any transcriptional errors that result from this process are unintentional.

## 2018-07-31 NOTE — Assessment & Plan Note (Signed)
Patient's right groin pain seems to be severe.  Positive fulcrum test.  Patient has decreased range of motion with internal range of motion.  Negative straight leg test but will get back x-rays to further evaluate.  History of osteopenia and concern for potential occult fracture.  Patient is having difficulty even with regular daily activities.

## 2018-07-31 NOTE — Patient Instructions (Addendum)
Good to see you.  Ice 20 minutes 2 times daily. Usually after activity and before bed. Thigh compression Exercises 3 times a week.  Vitamin D 2000 IU daily  See me again in 2-3 weeks

## 2018-08-04 ENCOUNTER — Telehealth: Payer: Self-pay | Admitting: *Deleted

## 2018-08-04 NOTE — Telephone Encounter (Signed)
Says she can get on my chart.  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 Continue with the plan

## 2018-08-04 NOTE — Telephone Encounter (Signed)
Copied from [place] [id number]. Topic: General - Other >> Aug 01, 2018  3:14 PM Nils Flack wrote: Reason for CRM: Dr Tamala Julian pt -  Pleas call pt when her xrays are ready She is not able to access my chart  [phone]

## 2018-08-05 NOTE — Telephone Encounter (Signed)
Talked to patient.

## 2018-08-14 ENCOUNTER — Ambulatory Visit (INDEPENDENT_AMBULATORY_CARE_PROVIDER_SITE_OTHER): Payer: PPO | Admitting: Family Medicine

## 2018-08-14 ENCOUNTER — Other Ambulatory Visit: Payer: Self-pay

## 2018-08-14 ENCOUNTER — Encounter: Payer: Self-pay | Admitting: Family Medicine

## 2018-08-14 VITALS — BP 112/74 | HR 64 | Ht 65.0 in | Wt 178.0 lb

## 2018-08-14 DIAGNOSIS — R1031 Right lower quadrant pain: Secondary | ICD-10-CM | POA: Diagnosis not present

## 2018-08-14 DIAGNOSIS — M25551 Pain in right hip: Secondary | ICD-10-CM

## 2018-08-14 MED ORDER — TRAMADOL HCL 50 MG PO TABS: 100.0000 mg | ORAL_TABLET | Freq: Four times a day (QID) | ORAL | 0 refills | Status: AC | PRN

## 2018-08-14 MED ORDER — KETOROLAC TROMETHAMINE 60 MG/2ML IM SOLN
60.0000 mg | Freq: Once | INTRAMUSCULAR | Status: AC
  Administered 2018-08-14: 60 mg via INTRAMUSCULAR

## 2018-08-14 NOTE — Patient Instructions (Signed)
Sorry you are hurting [facility] to schedule MRI [id number] Call to set up virtual a day or two after MRI appointmnet

## 2018-08-14 NOTE — Progress Notes (Signed)
[name] [address] [place], [place] [address] Phone: [phone] Subjective:   [name], am serving as a scribe for Dr. Hulan Saas.     CC: back and hip pain   [id number]   07/31/2018: Patient's right groin pain seems to be severe.  Positive fulcrum test.  Patient has decreased range of motion with internal range of motion.  Negative straight leg test but will get back x-rays to further evaluate.  History of osteopenia and concern for potential occult fracture.  Patient is having difficulty even with regular daily activities.  Update 08/14/2018: Susan Holland is a 73 y.o. female coming in with complaint of right groin, thigh and knee pain. Has been doing exercises and Vitamin D. Pain is constant and worse with movement. Is using tramadol which did not help her pain. Has been using excedrin for arthritis extra strength.  Patient states it is even more difficult to walk now than it was previously.  Patient states even at night more discomfort.  Feels like she is losing some range of motion.     Past Medical History:  Diagnosis Date  . Colon polyp   . Diverticulosis   . GERD (gastroesophageal reflux disease)   . Glaucoma suspect   . Hyperlipidemia    Past Surgical History:  Procedure Laterality Date  . CATARACT EXTRACTION Bilateral 2016  . COLONOSCOPY W/ POLYPECTOMY  03/2011   repeat 2020; Dr Olevia Perches  . 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 TONSILLECTOMY AND ADENOIDECTOMY    . TUBAL LIGATION     Social History   Socioeconomic History  . Marital status: Widowed    Spouse name: Not on file  . Number of children: 2  . Years of education: Not on file  . Highest education level: Not on file  Occupational History  . Not on file  Social Needs  . Financial resource strain: Not hard at all  . Food insecurity    Worry: Never true    Inability: Never true  . Transportation needs    Medical: No    Non-medical:  No  Tobacco Use  . Smoking status: Former Smoker    Types: Cigarettes    Quit date: 03/29/2015    Years since quitting: 3.3  . Smokeless tobacco: Never Used  . Tobacco comment: smoked 1963- present, up to 1/2 ppd  Substance and Sexual Activity  . Alcohol use: Yes    Alcohol/week: 0.0 standard drinks    Comment:  occasionally   . Drug use: No  . Sexual activity: Never  Lifestyle  . Physical activity    Days per week: 0 days    Minutes per session: 0 min  . Stress: Not at all  Relationships  . Social Herbalist on phone: More than three times a week    Gets together: Not on file    Attends religious service: 1 to 4 times per year    Active member of club or organization: Yes    Attends meetings of clubs or organizations: More than 4 times per year    Relationship status: Widowed  Other Topics Concern  . Not on file  Social History Narrative   Exercise:  Does some walking, but irregularly   No Known Allergies Family History  Problem Relation Age of Onset  . Dementia Mother        Alsheimer's  .  Stroke Father 27  . Heart disease Sister        AF  . Colon cancer Maternal Uncle        X 2  . Diabetes Neg Hx      Current Outpatient Medications (Cardiovascular):  .  atorvastatin (LIPITOR) 20 MG tablet, Take 1 tablet (20 mg total) by mouth daily. Marland Kitchen  telmisartan-hydrochlorothiazide (MICARDIS HCT) 80-12.5 MG tablet, Take 1 tablet by mouth daily.   Current Outpatient Medications (Analgesics):  .  meloxicam (MOBIC) 15 MG tablet, Take 1 tablet (15 mg total) by mouth daily. Take with food. .  traMADol (ULTRAM) 50 MG tablet, Take 2 tablets (100 mg total) by mouth every 6 (six) hours as needed for up to 5 days for severe pain.   Current Outpatient Medications (Other):  Marland Kitchen  Calcium Carbonate-Vitamin D (CALCIUM + D PO), Take by mouth daily. Take 2 600 mg daily .  FIBER COMPLETE PO, Take by mouth 2 (two) times daily. .  Multiple Vitamin (MULTIVITAMIN) tablet, Take 1  tablet by mouth daily.    Past medical history, social, surgical and family history all reviewed in electronic medical record.  No pertanent information unless stated regarding to the chief complaint.   Review of Systems:  No headache, visual changes, nausea, vomiting, diarrhea, constipation, dizziness, abdominal pain, skin rash, fevers, chills, night sweats, weight loss, swollen lymph nodes, body aches, joint swelling, muscle aches, chest pain, shortness of breath, mood changes.   Objective  Blood pressure 112/74, pulse 64, height 5\' 5"  (1.651 m), weight 178 lb (80.7 kg), SpO2 98 %.    General: No apparent distress alert and oriented x3 mood and affect normal, dressed appropriately.  HEENT: Pupils equal, extraocular movements intact  Respiratory: Patient's speak in full sentences and does not appear short of breath  Cardiovascular: No lower extremity edema, non tender, no erythema  Skin: Warm dry intact with no signs of infection or rash on extremities or on axial skeleton.  Abdomen: Soft nontender  Neuro: Cranial nerves II through XII are intact, neurovascularly intact in all extremities with 2+ DTRs and 2+ pulses.  Lymph: No lymphadenopathy of posterior or anterior cervical chain or axillae bilaterally.  Gait antalgic  MSK:  tender with limited range of motion and good stability and symmetric strength and tone of shoulders, elbows, wrist, , knee and ankles bilaterally.   Right hip exam has decreased range of motion especially with internal range of motion of 0 degrees.  Patient has 4 out of 5 strength which is new as well compared to contralateral side.  Neurovascular intact distally.  Negative straight leg test.   Impression and Recommendations:     This case required medical decision making of moderate complexity. The above documentation has been reviewed and is accurate and complete Lyndal Pulley, DO       Note: This dictation was prepared with Dragon dictation along with  smaller phrase technology. Any transcriptional errors that result from this process are unintentional.

## 2018-08-14 NOTE — Progress Notes (Signed)
Subjective:    Patient ID: Susan Holland, female    DOB: 1945/10/01, 73 y.o.   MRN: [id number]  HPI She is here for a physical exam.    Her hip still hurts and is limiting her activity.  She has felt tired since the hip started to hurt.   She otherwise feels good.     Medications and allergies reviewed with patient and updated if appropriate.  Patient Active Problem List   Diagnosis Date Noted  . Groin pain, right 07/16/2018  . Osteoporosis 11/29/2017  . Prediabetes 06/05/2017  . Difficulty sleeping 05/21/2016  . Grieving 05/21/2016  . Nonspecific abnormal electrocardiogram (ECG) (EKG) 05/18/2014  . Essential hypertension 05/17/2014  . Glaucoma suspect 05/04/2013  . Skin cancer 03/19/2011  . Osteopenia 03/19/2011  . History of colonic polyps 03/17/2010  . HYPERLIPIDEMIA 03/06/2007    Current Outpatient Medications on File Prior to Visit  Medication Sig Dispense Refill  . atorvastatin (LIPITOR) 20 MG tablet Take 1 tablet (20 mg total) by mouth daily. 90 tablet 0  . Calcium Carbonate-Vitamin D (CALCIUM + D PO) Take by mouth daily. Take 2 600 mg daily    . FIBER COMPLETE PO Take by mouth 2 (two) times daily.    . meloxicam (MOBIC) 15 MG tablet Take 1 tablet (15 mg total) by mouth daily. Take with food. 30 tablet 0  . Multiple Vitamin (MULTIVITAMIN) tablet Take 1 tablet by mouth daily.    Marland Kitchen telmisartan-hydrochlorothiazide (MICARDIS HCT) 80-12.5 MG tablet Take 1 tablet by mouth daily. 90 tablet 1  . traMADol (ULTRAM) 50 MG tablet Take 2 tablets (100 mg total) by mouth every 6 (six) hours as needed for up to 5 days for severe pain. 40 tablet 0   No current facility-administered medications on file prior to visit.     Past Medical History:  Diagnosis Date  . Colon polyp   . Diverticulosis   . GERD (gastroesophageal reflux disease)   . Glaucoma suspect   . Hyperlipidemia     Past Surgical History:  Procedure Laterality Date  . CATARACT EXTRACTION Bilateral 2016   . COLONOSCOPY W/ POLYPECTOMY  03/2011   repeat 2020; Dr Olevia Perches  . 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 TONSILLECTOMY AND ADENOIDECTOMY    . TUBAL LIGATION      Social History   Socioeconomic History  . Marital status: Widowed    Spouse name: Not on file  . Number of children: 2  . Years of education: Not on file  . Highest education level: Not on file  Occupational History  . Not on file  Social Needs  . Financial resource strain: Not hard at all  . Food insecurity    Worry: Never true    Inability: Never true  . Transportation needs    Medical: No    Non-medical: No  Tobacco Use  . Smoking status: Former Smoker    Types: Cigarettes    Quit date: 03/29/2015    Years since quitting: 3.3  . Smokeless tobacco: Never Used  . Tobacco comment: smoked 1963- present, up to 1/2 ppd  Substance and Sexual Activity  . Alcohol use: Yes    Alcohol/week: 0.0 standard drinks    Comment:  occasionally   . Drug use: No  . Sexual activity: Never  Lifestyle  . Physical activity    Days per week: 0 days    Minutes per session: 0  min  . Stress: Not at all  Relationships  . Social Herbalist on phone: More than three times a week    Gets together: Not on file    Attends religious service: 1 to 4 times per year    Active member of club or organization: Yes    Attends meetings of clubs or organizations: More than 4 times per year    Relationship status: Widowed  Other Topics Concern  . Not on file  Social History Narrative   Exercise:  Does some walking, but irregularly    Family History  Problem Relation Age of Onset  . Dementia Mother        Alsheimer's  . Stroke Father 73  . Heart disease Sister        AF  . Colon cancer Maternal Uncle        X 2  . Diabetes Neg Hx     Review of Systems  Constitutional: Positive for fatigue. Negative for chills and fever.  Eyes: Negative for visual disturbance.  Respiratory: Negative for cough,  shortness of breath and wheezing.   Cardiovascular: Negative for chest pain, palpitations and leg swelling.  Gastrointestinal: Negative for abdominal pain, blood in stool, constipation, diarrhea and nausea.       No gerd  Genitourinary: Negative for dysuria and hematuria.  Musculoskeletal: Positive for arthralgias.  Skin: Negative for color change and rash.  Neurological: Negative for light-headedness and headaches.  Psychiatric/Behavioral: Negative for dysphoric mood. The patient is not nervous/anxious.        Objective:   Vitals:   08/15/18 0849  BP: 124/78  Pulse: 69  Resp: 16  Temp: 98.6 F (37 C)  SpO2: 98%   Filed Weights   08/15/18 0849  Weight: 180 lb 12.8 oz (82 kg)   Body mass index is 30.09 kg/m.  BP Readings from Last 3 Encounters:  08/15/18 124/78  08/14/18 112/74  07/31/18 128/84    Wt Readings from Last 3 Encounters:  08/15/18 180 lb 12.8 oz (82 kg)  08/14/18 178 lb (80.7 kg)  07/16/18 185 lb (83.9 kg)     Physical Exam Constitutional: She appears well-developed and well-nourished. No distress.  HENT:  Head: Normocephalic and atraumatic.  Right Ear: External ear normal. Normal ear canal and TM Left Ear: External ear normal.  Normal ear canal and TM Mouth/Throat: Oropharynx is clear and moist.  Eyes: Conjunctivae and EOM are normal.  Neck: Neck supple. No tracheal deviation present. No thyromegaly present.  No carotid bruit  Cardiovascular: Normal rate, regular rhythm and normal heart sounds.   No murmur heard.  No edema. Pulmonary/Chest: Effort normal and breath sounds normal. No respiratory distress. She has no wheezes. She has no rales.  Breast: deferred   Abdominal: Soft. She exhibits no distension. There is no tenderness.  Lymphadenopathy: She has no cervical adenopathy.  Skin: Skin is warm and dry. She is not diaphoretic.  Psychiatric: She has a normal mood and affect. Her behavior is normal.        Assessment & Plan:   Physical  exam: Screening blood work ordered Immunizations   All up to date Colonoscopy    Up to date  Mammogram   Due - will schedule Gyn - due - goes every two years - Dr Marvel Plan Dexa    Up to date  Eye exams   Up to date  Exercise - unable to exercise right now Weight  - advised weight loss Skin  No concerns - sees derm annually Substance abuse   none  See Problem List for Assessment and Plan of chronic medical problems.

## 2018-08-14 NOTE — Progress Notes (Addendum)
Subjective:   Susan Holland is a 73 y.o. female who presents for Medicare Annual (Subsequent) preventive examination.  Review of Systems:   Cardiac Risk Factors include: advanced age (>69men, >62 women);dyslipidemia;hypertension Sleep patterns: gets up 1 times nightly to void and sleeps 7 hours nightly.    Home Safety/Smoke Alarms: Feels safe in home. Smoke alarms in place.  Living environment; residence and Firearm Safety: split level / walkout. Lives alone, no needs for DME, good support system Seat Belt Safety/Bike Helmet: Wears seat belt.     Objective:     Vitals: There were no vitals taken for this visit.  There is no height or weight on file to calculate BMI.  Advanced Directives 08/15/2018 06/05/2017  Does Patient Have a Medical Advance Directive? Yes Yes  Type of Paramedic of [place];Living will [place];Living will  Copy of [place] in Chart? No - copy requested -    Tobacco Social History   Tobacco Use  Smoking Status Former Smoker  . Types: Cigarettes  . Quit date: 03/29/2015  . Years since quitting: 3.3  Smokeless Tobacco Never Used  Tobacco Comment   smoked 1963- present, up to 1/2 ppd     Counseling given: Not Answered Comment: smoked 1963- present, up to 1/2 ppd  Past Medical History:  Diagnosis Date  . Colon polyp   . Diverticulosis   . GERD (gastroesophageal reflux disease)   . Glaucoma suspect   . Hyperlipidemia    Past Surgical History:  Procedure Laterality Date  . CATARACT EXTRACTION Bilateral 2016  . COLONOSCOPY W/ POLYPECTOMY  03/2011   repeat 2020; Dr Olevia Perches  . 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 TONSILLECTOMY AND ADENOIDECTOMY    . TUBAL LIGATION     Family History  Problem Relation Age of Onset  . Dementia Mother        Alsheimer's  . Stroke Father 64  . Heart disease Sister        AF  . Colon cancer Maternal Uncle        X 2  .  Diabetes Neg Hx    Social History   Socioeconomic History  . Marital status: Widowed    Spouse name: Not on file  . Number of children: 2  . Years of education: Not on file  . Highest education level: Not on file  Occupational History  . Occupation: retired  Scientific laboratory technician  . Financial resource strain: Not hard at all  . Food insecurity    Worry: Never true    Inability: Never true  . Transportation needs    Medical: No    Non-medical: No  Tobacco Use  . Smoking status: Former Smoker    Types: Cigarettes    Quit date: 03/29/2015    Years since quitting: 3.3  . Smokeless tobacco: Never Used  . Tobacco comment: smoked 1963- present, up to 1/2 ppd  Substance and Sexual Activity  . Alcohol use: Yes    Alcohol/week: 0.0 standard drinks    Comment:  occasionally   . Drug use: No  . Sexual activity: Never  Lifestyle  . Physical activity    Days per week: 0 days    Minutes per session: 0 min  . Stress: Not at all  Relationships  . Social connections    Talks on phone: More than three times a week    Gets  together: More than three times a week    Attends religious service: 1 to 4 times per year    Active member of club or organization: Yes    Attends meetings of clubs or organizations: More than 4 times per year    Relationship status: Widowed  Other Topics Concern  . Not on file  Social History Narrative   Exercise:  Does some walking, but irregularly    Outpatient Encounter Medications as of 08/15/2018  Medication Sig  . atorvastatin (LIPITOR) 20 MG tablet Take 1 tablet (20 mg total) by mouth daily.  . Calcium Carbonate-Vitamin D (CALCIUM + D PO) Take by mouth daily. Take 2 600 mg daily  . FIBER COMPLETE PO Take by mouth 2 (two) times daily.  . meloxicam (MOBIC) 15 MG tablet Take 1 tablet (15 mg total) by mouth daily. Take with food.  . Multiple Vitamin (MULTIVITAMIN) tablet Take 1 tablet by mouth daily.  Marland Kitchen telmisartan-hydrochlorothiazide (MICARDIS HCT) 80-12.5 MG  tablet Take 1 tablet by mouth daily.  . traMADol (ULTRAM) 50 MG tablet Take 2 tablets (100 mg total) by mouth every 6 (six) hours as needed for up to 5 days for severe pain.  . [DISCONTINUED] atorvastatin (LIPITOR) 20 MG tablet Take 1 tablet (20 mg total) by mouth daily.  . [DISCONTINUED] telmisartan-hydrochlorothiazide (MICARDIS HCT) 80-12.5 MG tablet Take 1 tablet by mouth daily.   No facility-administered encounter medications on file as of 08/15/2018.     Activities of Daily Living In your present state of health, do you have any difficulty performing the following activities: 08/15/2018  Hearing? N  Vision? N  Difficulty concentrating or making decisions? N  Walking or climbing stairs? N  Dressing or bathing? N  Doing errands, shopping? N  Preparing Food and eating ? N  Using the Toilet? N  In the past six months, have you accidently leaked urine? N  Do you have problems with loss of bowel control? N  Managing your Medications? N  Managing your Finances? N  Housekeeping or managing your Housekeeping? N  Some recent data might be hidden    Patient Care Team: Binnie Rail, MD as PCP - General (Internal Medicine) Paula Compton, MD as Consulting Physician (Obstetrics and Gynecology)    Assessment:   This is a routine wellness examination for [place]. Physical assessment deferred to PCP.   Exercise Activities and Dietary recommendations Current Exercise Habits: The patient does not participate in regular exercise at present(not exercising due to hip pain, plans to stay as active as possible and will return to exercise when pain improves.), Exercise limited by: orthopedic condition(s) Diet (meal preparation, eat out, water intake, caffeinated beverages, dairy products, fruits and vegetables): in general, a "healthy" diet  , well balanced   Reviewed heart healthy diet. Encouraged patient to increase daily water and healthy fluid intake.  Goals    . Patient Stated     I will  begin to walk at the church 2-3 times weekly. Watch what I eat        Fall Risk Fall Risk  08/15/2018 06/05/2017 05/17/2014 04/13/2013  Falls in the past year? 0 Yes No No  Number falls in past yr: 0 1 - -  Follow up - Falls prevention discussed - -     Depression Screen PHQ 2/9 Scores 08/15/2018 06/05/2017 05/17/2014 04/13/2013  PHQ - 2 Score 0 1 0 0  PHQ- 9 Score - 5 - -     Cognitive Function MMSE - Mini  Mental State Exam 06/05/2017  Orientation to time 5  Orientation to Place 5  Registration 3  Attention/ Calculation 3  Recall 1  Language- name 2 objects 2  Language- repeat 1  Language- follow 3 step command 3  Language- read & follow direction 1  Write a sentence 1  Copy design 1  Total score 26       Ad8 score reviewed for issues:  Issues making decisions: no  Less interest in hobbies / activities: no  Repeats questions, stories (family complaining): no  Trouble using ordinary gadgets (microwave, computer, phone):no  Forgets the month or year: no  Mismanaging finances: no  Remembering appts: no  Daily problems with thinking and/or memory: no Ad8 score is= 0  Immunization History  Administered Date(s) Administered  . Influenza Whole 10/29/2007  . Influenza, High Dose Seasonal PF 11/14/2016, 11/06/2017  . Influenza,inj,Quad PF,6+ Mos 11/14/2012  . Influenza-Unspecified 11/04/2013, 12/16/2014, 11/10/2015  . Pneumococcal Conjugate-13 10/05/2014  . Pneumococcal Polysaccharide-23 03/19/2011  . Td 03/17/2010  . Zoster 07/02/2006  . Zoster Recombinat (Shingrix) 06/10/2017, 09/05/2017   Screening Tests Health Maintenance  Topic Date Due  . MAMMOGRAM  05/22/2018  . INFLUENZA VACCINE  09/06/2018  . DEXA SCAN  11/27/2019  . TETANUS/TDAP  03/17/2020  . COLONOSCOPY  03/12/2021  . Hepatitis C Screening  Completed  . PNA vac Low Risk Adult  Completed      Plan:      Reviewed health maintenance screenings with patient today and relevant education, vaccines,  and/or referrals were provided.   Continue to eat heart healthy diet (full of fruits, vegetables, whole grains, lean protein, water--limit salt, fat, and sugar intake) and increase physical activity as tolerated.  Continue doing brain stimulating activities (puzzles, reading, adult coloring books, staying active) to keep memory sharp.   I have personally reviewed and noted the following in the patient's chart:   . Medical and social history . Use of alcohol, tobacco or illicit drugs  . Current medications and supplements . Functional ability and status . Nutritional status . Physical activity . Advanced directives . List of other physicians . Vitals . Screenings to include cognitive, depression, and falls . Referrals and appointments  In addition, I have reviewed and discussed with patient certain preventive protocols, quality metrics, and best practice recommendations. A written personalized care plan for preventive services as well as general preventive health recommendations were provided to patient.     Michiel Cowboy, RN  08/15/2018    Medical screening examination/treatment/procedure(s) were performed by non-physician practitioner and as supervising physician I was immediately available for consultation/collaboration. I agree with above. Binnie Rail, MD

## 2018-08-14 NOTE — Assessment & Plan Note (Signed)
Patient is significant decrease in range of motion.  Concern for patient having more of an avascular necrosis.  MRI of hip further evaluated today.  Hold on any type of steroids secondary to the concern for avascular necrosis, or the possibility of an occult fracture.  Patient does have osteoporosis.  Also has glaucoma.  Patient's depending on the MRI we will change medical management.  Refilled tramadol.  Toradol given today.

## 2018-08-14 NOTE — Patient Instructions (Addendum)
Tests ordered today. Your results will be released to [place] (or called to you) after review.  If any changes need to be made, you will be notified at that same time.  All other Health Maintenance issues reviewed.   All recommended immunizations and age-appropriate screenings are up-to-date or discussed.  No immunization administered today.   Medications reviewed and updated.  Changes include :   none  Your prescription(s) have been submitted to your pharmacy. Please take as directed and contact our office if you believe you are having problem(s) with the medication(s).    Please followup in 6 months    Health Maintenance, Female Adopting a healthy lifestyle and getting preventive care are important in promoting health and wellness. Ask your health care provider about:  The right schedule for you to have regular tests and exams.  Things you can do on your own to prevent diseases and keep yourself healthy. What should I know about diet, weight, and exercise? Eat a healthy diet   Eat a diet that includes plenty of vegetables, fruits, low-fat dairy products, and lean protein.  Do not eat a lot of foods that are high in solid fats, added sugars, or sodium. Maintain a healthy weight Body mass index (BMI) is used to identify weight problems. It estimates body fat based on height and weight. Your health care provider can help determine your BMI and help you achieve or maintain a healthy weight. Get regular exercise Get regular exercise. This is one of the most important things you can do for your health. Most adults should:  Exercise for at least 150 minutes each week. The exercise should increase your heart rate and make you sweat (moderate-intensity exercise).  Do strengthening exercises at least twice a week. This is in addition to the moderate-intensity exercise.  Spend less time sitting. Even light physical activity can be beneficial. Watch cholesterol and blood lipids Have  your blood tested for lipids and cholesterol at 73 years of age, then have this test every 5 years. Have your cholesterol levels checked more often if:  Your lipid or cholesterol levels are high.  You are older than 73 years of age.  You are at high risk for heart disease. What should I know about cancer screening? Depending on your health history and family history, you may need to have cancer screening at various ages. This may include screening for:  Breast cancer.  Cervical cancer.  Colorectal cancer.  Skin cancer.  Lung cancer. What should I know about heart disease, diabetes, and high blood pressure? Blood pressure and heart disease  High blood pressure causes heart disease and increases the risk of stroke. This is more likely to develop in people who have high blood pressure readings, are of African descent, or are overweight.  Have your blood pressure checked: ? Every 3-5 years if you are 24-64 years of age. ? Every year if you are 53 years old or older. Diabetes Have regular diabetes screenings. This checks your fasting blood sugar level. Have the screening done:  Once every three years after age 38 if you are at a normal weight and have a low risk for diabetes.  More often and at a younger age if you are overweight or have a high risk for diabetes. What should I know about preventing infection? Hepatitis B If you have a higher risk for hepatitis B, you should be screened for this virus. Talk with your health care provider to find out if you are at  risk for hepatitis B infection. Hepatitis C Testing is recommended for:  Everyone born from [birth year] through 1965.  Anyone with known risk factors for hepatitis C. Sexually transmitted infections (STIs)  Get screened for STIs, including gonorrhea and chlamydia, if: ? You are sexually active and are younger than 73 years of age. ? You are older than 73 years of age and your health care provider tells you that you are at  risk for this type of infection. ? Your sexual activity has changed since you were last screened, and you are at increased risk for chlamydia or gonorrhea. Ask your health care provider if you are at risk.  Ask your health care provider about whether you are at high risk for HIV. Your health care provider may recommend a prescription medicine to help prevent HIV infection. If you choose to take medicine to prevent HIV, you should first get tested for HIV. You should then be tested every 3 months for as long as you are taking the medicine. Pregnancy  If you are about to stop having your period (premenopausal) and you may become pregnant, seek counseling before you get pregnant.  Take 400 to 800 micrograms (mcg) of folic acid every day if you become pregnant.  Ask for birth control (contraception) if you want to prevent pregnancy. Osteoporosis and menopause Osteoporosis is a disease in which the bones lose minerals and strength with aging. This can result in bone fractures. If you are 2 years old or older, or if you are at risk for osteoporosis and fractures, ask your health care provider if you should:  Be screened for bone loss.  Take a calcium or vitamin D supplement to lower your risk of fractures.  Be given hormone replacement therapy (HRT) to treat symptoms of menopause. Follow these instructions at home: Lifestyle  Do not use any products that contain nicotine or tobacco, such as cigarettes, e-cigarettes, and chewing tobacco. If you need help quitting, ask your health care provider.  Do not use street drugs.  Do not share needles.  Ask your health care provider for help if you need support or information about quitting drugs. Alcohol use  Do not drink alcohol if: ? Your health care provider tells you not to drink. ? You are pregnant, may be pregnant, or are planning to become pregnant.  If you drink alcohol: ? Limit how much you use to 0-1 drink a day. ? Limit intake if you  are breastfeeding.  Be aware of how much alcohol is in your drink. In the [place] one drink equals one 12 oz bottle of beer (355 mL), one 5 oz glass of wine (148 mL), or one 1 oz glass of hard liquor (44 mL). General instructions  Schedule regular health, dental, and eye exams.  Stay current with your vaccines.  Tell your health care provider if: ? You often feel depressed. ? You have ever been abused or do not feel safe at home. Summary  Adopting a healthy lifestyle and getting preventive care are important in promoting health and wellness.  Follow your health care provider's instructions about healthy diet, exercising, and getting tested or screened for diseases.  Follow your health care provider's instructions on monitoring your cholesterol and blood pressure. This information is not intended to replace advice given to you by your health care provider. Make sure you discuss any questions you have with your health care provider. Document Released: 08/07/2010 Document Revised: 01/15/2018 Document Reviewed: 01/15/2018 Elsevier Patient Education  2020 [employer]  

## 2018-08-15 ENCOUNTER — Encounter: Payer: Self-pay | Admitting: Internal Medicine

## 2018-08-15 ENCOUNTER — Other Ambulatory Visit: Payer: Self-pay

## 2018-08-15 ENCOUNTER — Ambulatory Visit (INDEPENDENT_AMBULATORY_CARE_PROVIDER_SITE_OTHER): Payer: PPO | Admitting: *Deleted

## 2018-08-15 ENCOUNTER — Other Ambulatory Visit (INDEPENDENT_AMBULATORY_CARE_PROVIDER_SITE_OTHER): Payer: PPO

## 2018-08-15 ENCOUNTER — Ambulatory Visit (INDEPENDENT_AMBULATORY_CARE_PROVIDER_SITE_OTHER): Payer: PPO | Admitting: Internal Medicine

## 2018-08-15 ENCOUNTER — Ambulatory Visit
Admission: RE | Admit: 2018-08-15 | Discharge: 2018-08-15 | Disposition: A | Discharge status: Home / Self Care | Payer: PPO | Source: Ambulatory Visit | Attending: Family Medicine | Admitting: Family Medicine

## 2018-08-15 VITALS — BP 124/78 | HR 69 | Temp 98.6°F | Resp 16 | Ht 65.0 in | Wt 180.8 lb

## 2018-08-15 VITALS — BP 124/78 | HR 69 | Resp 16 | Ht 65.0 in | Wt 178.0 lb

## 2018-08-15 DIAGNOSIS — R7303 Prediabetes: Secondary | ICD-10-CM | POA: Diagnosis not present

## 2018-08-15 DIAGNOSIS — M25551 Pain in right hip: Secondary | ICD-10-CM | POA: Diagnosis not present

## 2018-08-15 DIAGNOSIS — M816 Localized osteoporosis [Lequesne]: Secondary | ICD-10-CM | POA: Diagnosis not present

## 2018-08-15 DIAGNOSIS — E782 Mixed hyperlipidemia: Secondary | ICD-10-CM

## 2018-08-15 DIAGNOSIS — Z0000 Encounter for general adult medical examination without abnormal findings: Secondary | ICD-10-CM

## 2018-08-15 DIAGNOSIS — I10 Essential (primary) hypertension: Secondary | ICD-10-CM | POA: Diagnosis not present

## 2018-08-15 LAB — COMPREHENSIVE METABOLIC PANEL
ALT: 16 U/L (ref 0–35)
AST: 14 U/L (ref 0–37)
Albumin: 4.4 g/dL (ref 3.5–5.2)
Alkaline Phosphatase: 57 U/L (ref 39–117)
BUN: 25 mg/dL — ABNORMAL HIGH (ref 6–23)
CO2: 28 mEq/L (ref 19–32)
Calcium: 9.7 mg/dL (ref 8.4–10.5)
Chloride: 95 mEq/L — ABNORMAL LOW (ref 96–112)
Creatinine, Ser: 0.95 mg/dL (ref 0.40–1.20)
GFR: 57.6 mL/min — ABNORMAL LOW (ref >60.00)
Glucose, Bld: 108 mg/dL — ABNORMAL HIGH (ref 70–99)
Potassium: 4.4 mEq/L (ref 3.5–5.1)
Sodium: 132 mEq/L — ABNORMAL LOW (ref 135–145)
Total Bilirubin: 0.8 mg/dL (ref 0.2–1.2)
Total Protein: 7.2 g/dL (ref 6.0–8.3)

## 2018-08-15 LAB — CBC WITH DIFFERENTIAL/PLATELET
Basophils Absolute: 0 10*3/uL (ref 0.0–0.1)
Basophils Relative: 0.3 % (ref 0.0–3.0)
Eosinophils Absolute: 0.1 10*3/uL (ref 0.0–0.7)
Eosinophils Relative: 0.7 % (ref 0.0–5.0)
HCT: 38.2 % (ref 36.0–46.0)
Hemoglobin: 13 g/dL (ref 12.0–15.0)
Lymphocytes Relative: 13.5 % (ref 12.0–46.0)
Lymphs Abs: 1.4 10*3/uL (ref 0.7–4.0)
MCHC: 34.1 g/dL (ref 30.0–36.0)
MCV: 90.7 fl (ref 78.0–100.0)
Monocytes Absolute: 0.8 10*3/uL (ref 0.1–1.0)
Monocytes Relative: 7.8 % (ref 3.0–12.0)
Neutro Abs: 8.3 10*3/uL — ABNORMAL HIGH (ref 1.4–7.7)
Neutrophils Relative %: 77.7 % — ABNORMAL HIGH (ref 43.0–77.0)
Platelets: 291 10*3/uL (ref 150.0–400.0)
RBC: 4.21 Mil/uL (ref 3.87–5.11)
RDW: 13.3 % (ref 11.5–15.5)
WBC: 10.6 10*3/uL — ABNORMAL HIGH (ref 4.0–10.5)

## 2018-08-15 LAB — LIPID PANEL
Cholesterol: 116 mg/dL (ref 0–200)
HDL: 41.4 mg/dL (ref >39.00)
LDL Cholesterol: 56 mg/dL (ref 0–99)
NonHDL: 74.29
Total CHOL/HDL Ratio: 3
Triglycerides: 93 mg/dL (ref 0.0–149.0)
VLDL: 18.6 mg/dL (ref 0.0–40.0)

## 2018-08-15 LAB — HEMOGLOBIN A1C: Hgb A1c MFr Bld: 6.7 % — ABNORMAL HIGH (ref 4.6–6.5)

## 2018-08-15 LAB — TSH: TSH: 1.11 u[IU]/mL (ref 0.35–4.50)

## 2018-08-15 MED ORDER — ATORVASTATIN CALCIUM 20 MG PO TABS: 20.0000 mg | ORAL_TABLET | Freq: Every day | ORAL | 1 refills | Status: DC

## 2018-08-15 MED ORDER — TELMISARTAN-HCTZ 80-12.5 MG PO TABS: 1.0000 | ORAL_TABLET | Freq: Every day | ORAL | 1 refills | Status: DC

## 2018-08-15 NOTE — Assessment & Plan Note (Signed)
Check lipid panel,cmp ,tsh Continue daily statin Regular exercise and healthy diet encouraged  

## 2018-08-15 NOTE — Patient Instructions (Signed)
Continue doing brain stimulating activities (puzzles, reading, adult coloring books, staying active) to keep memory sharp.   Continue to eat heart healthy diet (full of fruits, vegetables, whole grains, lean protein, water--limit salt, fat, and sugar intake) and increase physical activity as tolerated.   Preventive Care 99 Years and Older, Female Preventive care refers to lifestyle choices and visits with your health care provider that can promote health and wellness. This includes:  A yearly physical exam. This is also called an annual well check.  Regular dental and eye exams.  Immunizations.  Screening for certain conditions.  Healthy lifestyle choices, such as diet and exercise. What can I expect for my preventive care visit? Physical exam Your health care provider will check:  Height and weight. These may be used to calculate body mass index (BMI), which is a measurement that tells if you are at a healthy weight.  Heart rate and blood pressure.  Your skin for abnormal spots. Counseling Your health care provider may ask you questions about:  Alcohol, tobacco, and drug use.  Emotional well-being.  Home and relationship well-being.  Sexual activity.  Eating habits.  History of falls.  Memory and ability to understand (cognition).  Work and work Statistician.  Pregnancy and menstrual history. What immunizations do I need?  Influenza (flu) vaccine  This is recommended every year. Tetanus, diphtheria, and pertussis (Tdap) vaccine  You may need a Td booster every 10 years. Varicella (chickenpox) vaccine  You may need this vaccine if you have not already been vaccinated. Zoster (shingles) vaccine  You may need this after age 4. Pneumococcal conjugate (PCV13) vaccine  One dose is recommended after age 97. Pneumococcal polysaccharide (PPSV23) vaccine  One dose is recommended after age 79. Measles, mumps, and rubella (MMR) vaccine  You may need at least  one dose of MMR if you were born in 1957 or later. You may also need a second dose. Meningococcal conjugate (MenACWY) vaccine  You may need this if you have certain conditions. Hepatitis A vaccine  You may need this if you have certain conditions or if you travel or work in places where you may be exposed to hepatitis A. Hepatitis B vaccine  You may need this if you have certain conditions or if you travel or work in places where you may be exposed to hepatitis B. Haemophilus influenzae type b (Hib) vaccine  You may need this if you have certain conditions. You may receive vaccines as individual doses or as more than one vaccine together in one shot (combination vaccines). Talk with your health care provider about the risks and benefits of combination vaccines. What tests do I need? Blood tests  Lipid and cholesterol levels. These may be checked every 5 years, or more frequently depending on your overall health.  Hepatitis C test.  Hepatitis B test. Screening  Lung cancer screening. You may have this screening every year starting at age 97 if you have a 30-pack-year history of smoking and currently smoke or have quit within the past 15 years.  Colorectal cancer screening. All adults should have this screening starting at age 70 and continuing until age 68. Your health care provider may recommend screening at age 40 if you are at increased risk. You will have tests every 1-10 years, depending on your results and the type of screening test.  Diabetes screening. This is done by checking your blood sugar (glucose) after you have not eaten for a while (fasting). You may have this done every  1-3 years.  Mammogram. This may be done every 1-2 years. Talk with your health care provider about how often you should have regular mammograms.  BRCA-related cancer screening. This may be done if you have a family history of breast, ovarian, tubal, or peritoneal cancers. Other tests  Sexually  transmitted disease (STD) testing.  Bone density scan. This is done to screen for osteoporosis. You may have this done starting at age 98. Follow these instructions at home: Eating and drinking  Eat a diet that includes fresh fruits and vegetables, whole grains, lean protein, and low-fat dairy products. Limit your intake of foods with high amounts of sugar, saturated fats, and salt.  Take vitamin and mineral supplements as recommended by your health care provider.  Do not drink alcohol if your health care provider tells you not to drink.  If you drink alcohol: ? Limit how much you have to 0-1 drink a day. ? Be aware of how much alcohol is in your drink. In the [place] one drink equals one 12 oz bottle of beer (355 mL), one 5 oz glass of Libbey Duce (148 mL), or one 1 oz glass of hard liquor (44 mL). Lifestyle  Take daily care of your teeth and gums.  Stay active. Exercise for at least 30 minutes on 5 or more days each week.  Do not use any products that contain nicotine or tobacco, such as cigarettes, e-cigarettes, and chewing tobacco. If you need help quitting, ask your health care provider.  If you are sexually active, practice safe sex. Use a condom or other form of protection in order to prevent STIs (sexually transmitted infections).  Talk with your health care provider about taking a low-dose aspirin or statin. What's next?  Go to your health care provider once a year for a well check visit.  Ask your health care provider how often you should have your eyes and teeth checked.  Stay up to date on all vaccines. This information is not intended to replace advice given to you by your health care provider. Make sure you discuss any questions you have with your health care provider. Document Released: 02/18/2015 Document Revised: 01/16/2018 Document Reviewed: 01/16/2018 Elsevier Patient Education  2020 [employer].

## 2018-08-15 NOTE — Assessment & Plan Note (Signed)
BP well controlled Current regimen effective and well tolerated Continue current medications at current doses cmp  

## 2018-08-15 NOTE — Assessment & Plan Note (Signed)
dexa done 11/2017 Taking calcium and vitamin d Typically very active, but having hip pain and has not been able to exercise - will restart once able

## 2018-08-15 NOTE — Assessment & Plan Note (Signed)
Check a1c Low sugar / carb diet Stay as active as tolerated

## 2018-08-17 ENCOUNTER — Encounter: Payer: Self-pay | Admitting: Internal Medicine

## 2018-08-18 ENCOUNTER — Telehealth: Payer: Self-pay

## 2018-08-18 ENCOUNTER — Other Ambulatory Visit: Payer: Self-pay

## 2018-08-18 DIAGNOSIS — M1611 Unilateral primary osteoarthritis, right hip: Secondary | ICD-10-CM

## 2018-08-18 NOTE — Telephone Encounter (Signed)
Copied from [place] [id number]. Topic: General - Other >> Aug 18, 2018 10:43 AM Celene Kras A wrote: Reason for CRM: Pts daughter called to talk about pts MRI results. Please advise .

## 2018-08-18 NOTE — Telephone Encounter (Signed)
Ordered by Tamala Julian.

## 2018-08-27 DIAGNOSIS — M1611 Unilateral primary osteoarthritis, right hip: Secondary | ICD-10-CM | POA: Diagnosis not present

## 2018-09-05 ENCOUNTER — Other Ambulatory Visit: Payer: PPO

## 2018-09-22 ENCOUNTER — Other Ambulatory Visit: Payer: Self-pay | Admitting: Orthopaedic Surgery

## 2018-09-22 ENCOUNTER — Telehealth: Payer: Self-pay

## 2018-09-22 NOTE — Telephone Encounter (Signed)
Surgical clearance form has been refaxed.

## 2018-09-22 NOTE — Telephone Encounter (Signed)
Copied from [place] [id number]. Topic: General - Inquiry >> Sep 22, 2018 12:18 PM Susan Holland wrote: Reason for CRM: Wells Guiles from [place] called to check on the status of the surgical clearance that was faxed to office on 7.22.20/ Pt is scheduled for surgery on 9.8.20 / please advise   Fax# [phone]

## 2018-09-29 ENCOUNTER — Other Ambulatory Visit: Payer: Self-pay

## 2018-10-07 ENCOUNTER — Encounter (HOSPITAL_COMMUNITY): Payer: Self-pay

## 2018-10-07 NOTE — Patient Instructions (Addendum)
DUE TO COVID-19 ONLY ONE VISITOR IS ALLOWED TO COME WITH YOU AND STAY IN THE WAITING ROOM ONLY DURING PRE OP AND PROCEDURE DAY OF SURGERY.  THE 1 VISITOR MAY VISIT WITH YOU AFTER SURGERY IN YOUR PRIVATE ROOM DURING VISITING HOURS ONLY!   YOU NEED TO HAVE A COVID 19 TEST ON__Friday 9/4/20_____ @__1 :10_____, THIS TEST MUST BE DONE BEFORE SURGERY, COME  [address] , [id number].  ([place])   ONCE YOUR COVID TEST IS COMPLETED, PLEASE BEGIN THE QUARANTINE INSTRUCTIONS AS OUTLINED IN YOUR HANDOUT.                [place]   Your procedure is scheduled on: Tuesday 10/14/18   Report to [facility] Main  Entrance  Report to admitting at  7:24 AM     Call this number if you have problems the morning of surgery [place], NO CHEWING GUM [place].   Do not eat food After Midnight.   YOU MAY HAVE CLEAR LIQUIDS FROM MIDNIGHT UNTIL 6:30 AM  . At 6:30 AM Please finish the prescribed Pre-Surgery  drink.  Nothing by mouth after you finish the  drink !   Take these medicines the morning of surgery with A SIP OF WATER: none                                 You may not have any metal on your body including hair pins and              piercings               Do not wear jewelry, make-up, lotions, powders or perfumes, deodorant             Do not wear nail polish.  Do not shave  48 hours prior to surgery.        Do not bring valuables to the hospital. [place].  Contacts, dentures or bridgework may not be worn into surgery.    Name and phone number of your driver:  Special Instructions: N/A              Please read over the following fact sheets you were given            [facility] - Preparing for Surgery  Before surgery, you can play an important role.   Because skin is not sterile, your skin needs to be as free of  germs as possible.   You can reduce the number of germs on your skin by washing with CHG (chlorahexidine gluconate) soap before surgery.   CHG is an antiseptic cleaner which kills germs and bonds with the skin to continue killing germs even after washing. Please DO NOT use if you have an allergy to CHG or antibacterial soaps.   If your skin becomes reddened/irritated stop using the CHG and inform your nurse when you arrive at Short Stay. Do not shave (including legs and underarms) for at least 48 hours prior to the first CHG shower.   Please follow these instructions carefully:  1.  Shower with CHG Soap the night before surgery and the  morning of Surgery.  2.  If you choose  to wash your hair, wash your hair first as usual with your  normal  shampoo.  3.  After you shampoo, rinse your hair and body thoroughly to remove the  shampoo.                                        4.  Use CHG as you would any other liquid soap.  You can apply chg directly  to the skin and wash                       Gently with a scrungie or clean washcloth.  5.  Apply the CHG Soap to your body ONLY FROM THE NECK DOWN.   Do not use on face/ open                           Wound or open sores. Avoid contact with eyes, ears mouth and genitals (private parts).                       Wash face,  Genitals (private parts) with your normal soap.             6.  Wash thoroughly, paying special attention to the area where your surgery  will be performed.  7.  Thoroughly rinse your body with warm water from the neck down.  8.  DO NOT shower/wash with your normal soap after using and rinsing off  the CHG Soap.             9.  Pat yourself dry with a clean towel.            10.  Wear clean pajamas.            11.  Place clean sheets on your bed the night of your first shower and do not  sleep with pets.  Day of Surgery : Do not apply any lotions/deodorants the morning of surgery.  Please wear clean clothes to the hospital/surgery  center.   FAILURE TO FOLLOW THESE INSTRUCTIONS MAY RESULT IN THE CANCELLATION OF YOUR SURGERY PATIENT SIGNATURE_________________________________  NURSE SIGNATURE__________________________________  ________________________________________________________________________   Adam Phenix  An incentive spirometer is a tool that can help keep your lungs clear and active. This tool measures how well you are filling your lungs with each breath. Taking long deep breaths may help reverse or decrease the chance of developing breathing (pulmonary) problems (especially infection) following:  A long period of time when you are unable to move or be active. BEFORE THE PROCEDURE   If the spirometer includes an indicator to show your best effort, your nurse or respiratory therapist will set it to a desired goal.  If possible, sit up straight or lean slightly forward. Try not to slouch.  Hold the incentive spirometer in an upright position. INSTRUCTIONS FOR USE  1. Sit on the edge of your bed if possible, or sit up as far as you can in bed or on a chair. 2. Hold the incentive spirometer in an upright position. 3. Breathe out normally. 4. Place the mouthpiece in your mouth and seal your lips tightly around it. 5. Breathe in slowly and as deeply as possible, raising the piston or the ball toward the top of the column. 6. Hold your breath for 3-5 seconds or for as  long as possible. Allow the piston or ball to fall to the bottom of the column. 7. Remove the mouthpiece from your mouth and breathe out normally. 8. Rest for a few seconds and repeat Steps 1 through 7 at least 10 times every 1-2 hours when you are awake. Take your time and take a few normal breaths between deep breaths. 9. The spirometer may include an indicator to show your best effort. Use the indicator as a goal to work toward during each repetition. 10. After each set of 10 deep breaths, practice coughing to be sure your lungs are  clear. If you have an incision (the cut made at the time of surgery), support your incision when coughing by placing a pillow or rolled up towels firmly against it. Once you are able to get out of bed, walk around indoors and cough well. You may stop using the incentive spirometer when instructed by your caregiver.  RISKS AND COMPLICATIONS  Take your time so you do not get dizzy or light-headed.  If you are in pain, you may need to take or ask for pain medication before doing incentive spirometry. It is harder to take a deep breath if you are having pain. AFTER USE  Rest and breathe slowly and easily.  It can be helpful to keep track of a log of your progress. Your caregiver can provide you with a simple table to help with this. If you are using the spirometer at home, follow these instructions: [place] IF:   You are having difficultly using the spirometer.  You have trouble using the spirometer as often as instructed.  Your pain medication is not giving enough relief while using the spirometer.  You develop fever of 100.5 F (38.1 C) or higher. SEEK IMMEDIATE MEDICAL CARE IF:   You cough up bloody sputum that had not been present before.  You develop fever of 102 F (38.9 C) or greater.  You develop worsening pain at or near the incision site. MAKE SURE YOU:   Understand these instructions.  Will watch your condition.  Will get help right away if you are not doing well or get worse. Document Released: 06/04/2006 Document Revised: 04/16/2011 Document Reviewed: 08/05/2006 [facility] Patient Information 2014 [place], [place].   ________________________________________________________________________

## 2018-10-08 ENCOUNTER — Encounter (HOSPITAL_COMMUNITY)
Admission: RE | Admit: 2018-10-08 | Discharge: 2018-10-08 | Disposition: A | Discharge status: Home / Self Care | Payer: PPO | Source: Ambulatory Visit | Attending: Orthopaedic Surgery | Admitting: Orthopaedic Surgery

## 2018-10-08 ENCOUNTER — Ambulatory Visit (HOSPITAL_COMMUNITY)
Admission: RE | Admit: 2018-10-08 | Discharge: 2018-10-08 | Disposition: A | Discharge status: Home / Self Care | Payer: PPO | Source: Ambulatory Visit | Attending: Orthopaedic Surgery | Admitting: Orthopaedic Surgery

## 2018-10-08 ENCOUNTER — Other Ambulatory Visit: Payer: Self-pay

## 2018-10-08 ENCOUNTER — Encounter (HOSPITAL_COMMUNITY): Payer: Self-pay

## 2018-10-08 DIAGNOSIS — M1611 Unilateral primary osteoarthritis, right hip: Secondary | ICD-10-CM | POA: Insufficient documentation

## 2018-10-08 DIAGNOSIS — Z01818 Encounter for other preprocedural examination: Secondary | ICD-10-CM | POA: Insufficient documentation

## 2018-10-08 HISTORY — DX: Essential (primary) hypertension: I10

## 2018-10-08 LAB — CBC WITH DIFFERENTIAL/PLATELET
Abs Immature Granulocytes: 0.03 10*3/uL (ref 0.00–0.07)
Basophils Absolute: 0 10*3/uL (ref 0.0–0.1)
Basophils Relative: 1 %
Eosinophils Absolute: 0.1 10*3/uL (ref 0.0–0.5)
Eosinophils Relative: 1 %
HCT: 39.2 % (ref 36.0–46.0)
Hemoglobin: 12.8 g/dL (ref 12.0–15.0)
Immature Granulocytes: 0 %
Lymphocytes Relative: 29 %
Lymphs Abs: 2.3 10*3/uL (ref 0.7–4.0)
MCH: 31.2 pg (ref 26.0–34.0)
MCHC: 32.7 g/dL (ref 30.0–36.0)
MCV: 95.6 fL (ref 80.0–100.0)
Monocytes Absolute: 0.7 10*3/uL (ref 0.1–1.0)
Monocytes Relative: 8 %
Neutro Abs: 5 10*3/uL (ref 1.7–7.7)
Neutrophils Relative %: 61 %
Platelets: 266 10*3/uL (ref 150–400)
RBC: 4.1 MIL/uL (ref 3.87–5.11)
RDW: 13.4 % (ref 11.5–15.5)
WBC: 8.1 10*3/uL (ref 4.0–10.5)
nRBC: 0 % (ref 0.0–0.2)

## 2018-10-08 LAB — APTT: aPTT: 30 seconds (ref 24–36)

## 2018-10-08 LAB — BASIC METABOLIC PANEL
Anion gap: 12 (ref 5–15)
BUN: 14 mg/dL (ref 8–23)
CO2: 26 mmol/L (ref 22–32)
Calcium: 9.9 mg/dL (ref 8.9–10.3)
Chloride: 94 mmol/L — ABNORMAL LOW (ref 98–111)
Creatinine, Ser: 0.86 mg/dL (ref 0.44–1.00)
GFR calc Af Amer: >60 mL/min (ref >60)
GFR calc non Af Amer: >60 mL/min (ref >60)
Glucose, Bld: 109 mg/dL — ABNORMAL HIGH (ref 70–99)
Potassium: 4.5 mmol/L (ref 3.5–5.1)
Sodium: 132 mmol/L — ABNORMAL LOW (ref 135–145)

## 2018-10-08 LAB — SURGICAL PCR SCREEN
MRSA, PCR: NEGATIVE
Staphylococcus aureus: NEGATIVE

## 2018-10-08 LAB — PROTIME-INR
INR: 1 (ref 0.8–1.2)
Prothrombin Time: 12.5 seconds (ref 11.4–15.2)

## 2018-10-08 LAB — ABO/RH: ABO/RH(D): O POS

## 2018-10-08 NOTE — H&P (Signed)
TOTAL HIP ADMISSION H&P  Patient is admitted for right total hip arthroplasty.  Subjective:  Chief Complaint: right hip pain  HPI: Susan Holland, 73 y.o. female, has a history of pain and functional disability in the right hip(s) due to arthritis and patient has failed non-surgical conservative treatments for greater than 12 weeks to include NSAID's and/or analgesics, flexibility and strengthening excercises, use of assistive devices, weight reduction as appropriate and activity modification.  Onset of symptoms was gradual starting 5 years ago with gradually worsening course since that time.The patient noted no past surgery on the right hip(s).  Patient currently rates pain in the right hip at 10 out of 10 with activity. Patient has night pain, worsening of pain with activity and weight bearing, trendelenberg gait, pain that interfers with activities of daily living and crepitus. Patient has evidence of subchondral cysts, subchondral sclerosis, periarticular osteophytes and joint space narrowing by imaging studies. This condition presents safety issues increasing the risk of falls.  There is no current active infection.  Patient Active Problem List   Diagnosis Date Noted  . Groin pain, right 07/16/2018  . Osteoporosis 11/29/2017  . Diabetes mellitus without complication ([place]) 99991111  . Difficulty sleeping 05/21/2016  . Grieving 05/21/2016  . Nonspecific abnormal electrocardiogram (ECG) (EKG) 05/18/2014  . Essential hypertension 05/17/2014  . Glaucoma suspect 05/04/2013  . Skin cancer 03/19/2011  . History of colonic polyps 03/17/2010  . HYPERLIPIDEMIA 03/06/2007   Past Medical History:  Diagnosis Date  . Colon polyp   . Diverticulosis   . Glaucoma suspect   . Hyperlipidemia   . Hypertension     Past Surgical History:  Procedure Laterality Date  . CATARACT EXTRACTION Bilateral 2016  . COLONOSCOPY W/ POLYPECTOMY  03/2011   repeat 2020; Dr Olevia Perches  . 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 TONSILLECTOMY AND ADENOIDECTOMY    . TUBAL LIGATION      No current facility-administered medications for this encounter.    Current Outpatient Medications  Medication Sig Dispense Refill Last Dose  . acetaminophen (TYLENOL) 650 MG CR tablet Take 1,300 mg by mouth every 8 (eight) hours as needed for pain.     Marland Kitchen atorvastatin (LIPITOR) 20 MG tablet Take 1 tablet (20 mg total) by mouth daily. (Patient taking differently: Take 20 mg by mouth every evening. ) 90 tablet 1   . Calcium Carbonate-Vitamin D (CALCIUM + D PO) Take 1 tablet by mouth 2 (two) times daily.      . Multiple Vitamin (MULTIVITAMIN WITH MINERALS) TABS tablet Take 1 tablet by mouth daily.     . Omega-3 Fatty Acids (FISH OIL) 1200 MG CAPS Take 1,200 mg by mouth 2 (two) times daily.     Marland Kitchen telmisartan-hydrochlorothiazide (MICARDIS HCT) 80-12.5 MG tablet Take 1 tablet by mouth daily. 90 tablet 1   . meloxicam (MOBIC) 15 MG tablet Take 1 tablet (15 mg total) by mouth daily. Take with food. (Patient not taking: Reported on 10/03/2018) 30 tablet 0 Not Taking at Unknown time   No Known Allergies  Social History   Tobacco Use  . Smoking status: Former Smoker    Types: Cigarettes    Quit date: 03/29/2015    Years since quitting: 3.5  . Smokeless tobacco: Never Used  . Tobacco comment: smoked 1963- present, up to 1/2 ppd  Substance Use Topics  . Alcohol use: Yes    Alcohol/week: 0.0 standard drinks  Comment:  occasionally     Family History  Problem Relation Age of Onset  . Dementia Mother        Alsheimer's  . Stroke Father 20  . Heart disease Sister        AF  . Colon cancer Maternal Uncle        X 2  . Diabetes Neg Hx      Review of Systems  Musculoskeletal: Positive for joint pain.       Right hip  All other systems reviewed and are negative.   Objective:  Physical Exam  Constitutional: She is oriented to person, place, and time. She appears well-developed and well-nourished.   HENT:  Head: Normocephalic and atraumatic.  Eyes: Pupils are equal, round, and reactive to light.  Neck: Normal range of motion.  Cardiovascular: Normal rate and regular rhythm.  Respiratory: Effort normal.  GI: Soft.  Musculoskeletal:     Comments: Right hip motion is limited and extremely painful in internal rotation.  Leg lengths are equal.  Straight leg raise is negative.  She walks with a markedly altered gait.  She has no lateral or posterior pain.  Sensation and motor function are intact in her feet with palpable pulses on both sides.    Neurological: She is alert and oriented to person, place, and time.  Skin: Skin is warm and dry.  Psychiatric: She has a normal mood and affect. Her behavior is normal. Judgment and thought content normal.    Vital signs in last 24 hours: Temp:  [98.4 F (36.9 C)] 98.4 F (36.9 C) (09/02 0846) Pulse Rate:  [63] 63 (09/02 0846) Resp:  [18] 18 (09/02 0846) BP: (142)/(65) 142/65 (09/02 0846) SpO2:  [98 %] 98 % (09/02 0846) Weight:  [79 kg] 79 kg (09/02 0846)  Labs:   Estimated body mass index is 28.98 kg/m as calculated from the following:   Height as of 10/08/18: 5\' 5"  (1.651 m).   Weight as of 10/08/18: 79 kg.   Imaging Review Plain radiographs demonstrate severe degenerative joint disease of the right hip(s). The bone quality appears to be good for age and reported activity level.      Assessment/Plan:  End stage primary arthritis, right hip(s)  The patient history, physical examination, clinical judgement of the provider and imaging studies are consistent with end stage degenerative joint disease of the right hip(s) and total hip arthroplasty is deemed medically necessary. The treatment options including medical management, injection therapy, arthroscopy and arthroplasty were discussed at length. The risks and benefits of total hip arthroplasty were presented and reviewed. The risks due to aseptic loosening, infection, stiffness,  dislocation/subluxation,  thromboembolic complications and other imponderables were discussed.  The patient acknowledged the explanation, agreed to proceed with the plan and consent was signed. Patient is being admitted for inpatient treatment for surgery, pain control, PT, OT, prophylactic antibiotics, VTE prophylaxis, progressive ambulation and ADL's and discharge planning.The patient is planning to be discharged home with home health services

## 2018-10-08 NOTE — Progress Notes (Addendum)
PCP - Dr. Jenness Corner Cardiologist - no  Chest x-ray - done 10/08/18 EKG - done 10/08/18 Stress Test - no ECHO - no Cardiac Cath - no  Sleep Study - no CPAP -   Fasting Blood Sugar - NA Checks Blood Sugar _____ times a day  Blood Thinner Instructions:NA Aspirin Instructions: Last Dose:  Anesthesia review:   Patient denies shortness of breath, fever, cough and chest pain at PAT appointment yes  Patient verbalized understanding of instructions that were given to them at the PAT appointment. Patient was also instructed that they will need to review over the PAT instructions again at home before surgery. Yes Pt unable to void at PAT visit after  Drinking water . UA ordered for DOS

## 2018-10-10 ENCOUNTER — Other Ambulatory Visit (HOSPITAL_COMMUNITY): Payer: PPO

## 2018-10-10 ENCOUNTER — Other Ambulatory Visit (HOSPITAL_COMMUNITY)
Admission: RE | Admit: 2018-10-10 | Discharge: 2018-10-10 | Disposition: A | Discharge status: Home / Self Care | Payer: PPO | Source: Ambulatory Visit | Attending: Orthopaedic Surgery | Admitting: Orthopaedic Surgery

## 2018-10-10 DIAGNOSIS — Z20828 Contact with and (suspected) exposure to other viral communicable diseases: Secondary | ICD-10-CM | POA: Insufficient documentation

## 2018-10-10 DIAGNOSIS — Z01812 Encounter for preprocedural laboratory examination: Secondary | ICD-10-CM | POA: Diagnosis not present

## 2018-10-11 LAB — NOVEL CORONAVIRUS, NAA (HOSP ORDER, SEND-OUT TO REF LAB; TAT 18-24 HRS): SARS-CoV-2, NAA: NOT DETECTED

## 2018-10-13 MED ORDER — BUPIVACAINE LIPOSOME 1.3 % IJ SUSP
10.0000 mL | INTRAMUSCULAR | Status: AC
  Filled 2018-10-13: qty 10

## 2018-10-13 MED ORDER — TRANEXAMIC ACID 1000 MG/10ML IV SOLN
2000.0000 mg | INTRAVENOUS | Status: AC
  Filled 2018-10-13: qty 20

## 2018-10-13 NOTE — Anesthesia Preprocedure Evaluation (Addendum)
Anesthesia Evaluation  Patient identified by MRN, date of birth, ID band  Reviewed: Allergy & Precautions, NPO status , Patient's Chart, lab work & pertinent test results  Airway Mallampati: II  TM Distance: >3 FB Neck ROM: Full    Dental no notable dental hx. (+) Teeth Intact, Implants   Pulmonary neg pulmonary ROS, former smoker,    Pulmonary exam normal breath sounds clear to auscultation       Cardiovascular hypertension, Pt. on medications negative cardio ROS Normal cardiovascular exam Rhythm:Regular Rate:Normal     Neuro/Psych negative neurological ROS  negative psych ROS   GI/Hepatic negative GI ROS, Neg liver ROS,   Endo/Other  diabetes, Type 2  Renal/GU negative Renal ROS     Musculoskeletal   Abdominal   Peds  Hematology   Anesthesia Other Findings NKDA  Reproductive/Obstetrics                            Lab Results  Component Value Date   WBC 8.1 10/08/2018   HGB 12.8 10/08/2018   HCT 39.2 10/08/2018   MCV 95.6 10/08/2018   PLT 266 10/08/2018   Lab Results  Component Value Date   CREATININE 0.86 10/08/2018   BUN 14 10/08/2018   NA 132 (L) 10/08/2018   K 4.5 10/08/2018   CL 94 (L) 10/08/2018   CO2 26 10/08/2018    Anesthesia Physical Anesthesia Plan  ASA: II  Anesthesia Plan: Spinal   Post-op Pain Management:    Induction:   PONV Risk Score and Plan: Treatment may vary due to age or medical condition, Ondansetron and Dexamethasone  Airway Management Planned: Nasal Cannula and Natural Airway  Additional Equipment: None  Intra-op Plan:   Post-operative Plan:   Informed Consent: I have reviewed the patients History and Physical, chart, labs and discussed the procedure including the risks, benefits and alternatives for the proposed anesthesia with the patient or authorized representative who has indicated his/her understanding and acceptance.     Dental  advisory given  Plan Discussed with:   Anesthesia Plan Comments:        Anesthesia Quick Evaluation

## 2018-10-14 ENCOUNTER — Encounter (HOSPITAL_COMMUNITY): Payer: Self-pay

## 2018-10-14 ENCOUNTER — Observation Stay (HOSPITAL_COMMUNITY)
Admission: RE | Admit: 2018-10-14 | Discharge: 2018-10-15 | Disposition: A | Discharge status: Home / Self Care | Payer: PPO | Attending: Orthopaedic Surgery | Admitting: Orthopaedic Surgery

## 2018-10-14 ENCOUNTER — Ambulatory Visit (HOSPITAL_COMMUNITY): Payer: PPO

## 2018-10-14 ENCOUNTER — Ambulatory Visit (HOSPITAL_COMMUNITY): Payer: PPO | Admitting: Physician Assistant

## 2018-10-14 ENCOUNTER — Ambulatory Visit (HOSPITAL_COMMUNITY): Payer: PPO | Admitting: Certified Registered"

## 2018-10-14 ENCOUNTER — Other Ambulatory Visit: Payer: Self-pay

## 2018-10-14 ENCOUNTER — Encounter (HOSPITAL_COMMUNITY)
Admission: RE | Disposition: A | Discharge status: Home / Self Care | Payer: Self-pay | Source: Home / Self Care | Attending: Orthopaedic Surgery

## 2018-10-14 DIAGNOSIS — E785 Hyperlipidemia, unspecified: Secondary | ICD-10-CM | POA: Insufficient documentation

## 2018-10-14 DIAGNOSIS — Z87891 Personal history of nicotine dependence: Secondary | ICD-10-CM | POA: Insufficient documentation

## 2018-10-14 DIAGNOSIS — E119 Type 2 diabetes mellitus without complications: Secondary | ICD-10-CM | POA: Diagnosis not present

## 2018-10-14 DIAGNOSIS — M1611 Unilateral primary osteoarthritis, right hip: Principal | ICD-10-CM | POA: Diagnosis present

## 2018-10-14 DIAGNOSIS — Z79899 Other long term (current) drug therapy: Secondary | ICD-10-CM | POA: Insufficient documentation

## 2018-10-14 DIAGNOSIS — Z471 Aftercare following joint replacement surgery: Secondary | ICD-10-CM | POA: Diagnosis not present

## 2018-10-14 DIAGNOSIS — Z96641 Presence of right artificial hip joint: Secondary | ICD-10-CM | POA: Diagnosis not present

## 2018-10-14 DIAGNOSIS — I10 Essential (primary) hypertension: Secondary | ICD-10-CM | POA: Insufficient documentation

## 2018-10-14 DIAGNOSIS — Z419 Encounter for procedure for purposes other than remedying health state, unspecified: Secondary | ICD-10-CM

## 2018-10-14 HISTORY — PX: TOTAL HIP ARTHROPLASTY: SHX124

## 2018-10-14 LAB — URINALYSIS, ROUTINE W REFLEX MICROSCOPIC
Bilirubin Urine: NEGATIVE
Glucose, UA: NEGATIVE mg/dL
Ketones, ur: NEGATIVE mg/dL
Nitrite: NEGATIVE
Protein, ur: NEGATIVE mg/dL
Specific Gravity, Urine: 1.009 (ref 1.005–1.030)
WBC, UA: >50 WBC/hpf — ABNORMAL HIGH (ref 0–5)
pH: 6 (ref 5.0–8.0)

## 2018-10-14 LAB — TYPE AND SCREEN
ABO/RH(D): O POS
Antibody Screen: NEGATIVE

## 2018-10-14 SURGERY — ARTHROPLASTY, HIP, TOTAL, ANTERIOR APPROACH
Anesthesia: Spinal | Site: Hip | Laterality: Right

## 2018-10-14 MED ORDER — EPHEDRINE 5 MG/ML INJ
INTRAVENOUS | Status: AC
  Filled 2018-10-14: qty 10

## 2018-10-14 MED ORDER — CEFAZOLIN SODIUM-DEXTROSE 2-4 GM/100ML-% IV SOLN
2.0000 g | Freq: Four times a day (QID) | INTRAVENOUS | Status: AC
  Administered 2018-10-14 (×2): 2 g via INTRAVENOUS
  Filled 2018-10-14 (×2): qty 100

## 2018-10-14 MED ORDER — TRANEXAMIC ACID 1000 MG/10ML IV SOLN
INTRAVENOUS | Status: DC | PRN
  Administered 2018-10-14: 11:00:00 2000 mg via TOPICAL

## 2018-10-14 MED ORDER — SUCCINYLCHOLINE CHLORIDE 200 MG/10ML IV SOSY
PREFILLED_SYRINGE | INTRAVENOUS | Status: AC
  Filled 2018-10-14: qty 10

## 2018-10-14 MED ORDER — ROCURONIUM BROMIDE 10 MG/ML (PF) SYRINGE
PREFILLED_SYRINGE | INTRAVENOUS | Status: AC
  Filled 2018-10-14: qty 10

## 2018-10-14 MED ORDER — METOCLOPRAMIDE HCL 5 MG/ML IJ SOLN: 5.0000 mg | Freq: Three times a day (TID) | INTRAMUSCULAR | Status: DC | PRN

## 2018-10-14 MED ORDER — LIDOCAINE 2% (20 MG/ML) 5 ML SYRINGE
INTRAMUSCULAR | Status: AC
  Filled 2018-10-14: qty 5

## 2018-10-14 MED ORDER — ACETAMINOPHEN 325 MG PO TABS: 325.0000 mg | ORAL_TABLET | Freq: Four times a day (QID) | ORAL | Status: DC | PRN

## 2018-10-14 MED ORDER — TRANEXAMIC ACID-NACL 1000-0.7 MG/100ML-% IV SOLN
1000.0000 mg | INTRAVENOUS | Status: AC
  Administered 2018-10-14: 10:00:00 1000 mg via INTRAVENOUS
  Filled 2018-10-14: qty 100

## 2018-10-14 MED ORDER — ALBUMIN HUMAN 5 % IV SOLN
INTRAVENOUS | Status: AC
  Filled 2018-10-14: qty 250

## 2018-10-14 MED ORDER — TELMISARTAN-HCTZ 80-12.5 MG PO TABS: 1.0000 | ORAL_TABLET | Freq: Every day | ORAL | Status: DC

## 2018-10-14 MED ORDER — ONDANSETRON HCL 4 MG/2ML IJ SOLN: 4.0000 mg | Freq: Once | INTRAMUSCULAR | Status: DC | PRN

## 2018-10-14 MED ORDER — DOCUSATE SODIUM 100 MG PO CAPS
100.0000 mg | ORAL_CAPSULE | Freq: Two times a day (BID) | ORAL | Status: DC
  Administered 2018-10-14 – 2018-10-15 (×2): 100 mg via ORAL
  Filled 2018-10-14 (×2): qty 1

## 2018-10-14 MED ORDER — HYDROMORPHONE HCL 1 MG/ML IJ SOLN
INTRAMUSCULAR | Status: DC | PRN
  Administered 2018-10-14: 0.5 mg via INTRAVENOUS

## 2018-10-14 MED ORDER — FENTANYL CITRATE (PF) 100 MCG/2ML IJ SOLN: 25.0000 ug | INTRAMUSCULAR | Status: DC | PRN

## 2018-10-14 MED ORDER — MORPHINE SULFATE (PF) 2 MG/ML IV SOLN: 0.5000 mg | INTRAVENOUS | Status: DC | PRN

## 2018-10-14 MED ORDER — BUPIVACAINE-EPINEPHRINE (PF) 0.5% -1:200000 IJ SOLN
INTRAMUSCULAR | Status: AC
  Filled 2018-10-14: qty 30

## 2018-10-14 MED ORDER — CEFAZOLIN SODIUM-DEXTROSE 2-4 GM/100ML-% IV SOLN
2.0000 g | INTRAVENOUS | Status: AC
  Administered 2018-10-14: 2 g via INTRAVENOUS
  Filled 2018-10-14: qty 100

## 2018-10-14 MED ORDER — DIPHENHYDRAMINE HCL 12.5 MG/5ML PO ELIX: 12.5000 mg | ORAL_SOLUTION | ORAL | Status: DC | PRN

## 2018-10-14 MED ORDER — KETOROLAC TROMETHAMINE 15 MG/ML IJ SOLN
7.5000 mg | Freq: Four times a day (QID) | INTRAMUSCULAR | Status: AC
  Administered 2018-10-14 – 2018-10-15 (×4): 7.5 mg via INTRAVENOUS
  Filled 2018-10-14 (×4): qty 1

## 2018-10-14 MED ORDER — ONDANSETRON HCL 4 MG/2ML IJ SOLN
INTRAMUSCULAR | Status: AC
  Filled 2018-10-14: qty 2

## 2018-10-14 MED ORDER — STERILE WATER FOR IRRIGATION IR SOLN
Status: DC | PRN
  Administered 2018-10-14: 2000 mL

## 2018-10-14 MED ORDER — BUPIVACAINE IN DEXTROSE 0.75-8.25 % IT SOLN
INTRATHECAL | Status: DC | PRN
  Administered 2018-10-14: 1.6 mL via INTRATHECAL

## 2018-10-14 MED ORDER — POVIDONE-IODINE 10 % EX SWAB: 2.0000 "application " | Freq: Once | CUTANEOUS | Status: DC

## 2018-10-14 MED ORDER — METHOCARBAMOL 500 MG IVPB - SIMPLE MED
500.0000 mg | Freq: Four times a day (QID) | INTRAVENOUS | Status: DC | PRN
  Filled 2018-10-14: qty 50

## 2018-10-14 MED ORDER — TRANEXAMIC ACID-NACL 1000-0.7 MG/100ML-% IV SOLN
1000.0000 mg | Freq: Once | INTRAVENOUS | Status: AC
  Administered 2018-10-14: 15:00:00 1000 mg via INTRAVENOUS
  Filled 2018-10-14: qty 100

## 2018-10-14 MED ORDER — ONDANSETRON HCL 4 MG PO TABS: 4.0000 mg | ORAL_TABLET | Freq: Four times a day (QID) | ORAL | Status: DC | PRN

## 2018-10-14 MED ORDER — BUPIVACAINE LIPOSOME 1.3 % IJ SUSP
INTRAMUSCULAR | Status: DC | PRN
  Administered 2018-10-14: 10 mL

## 2018-10-14 MED ORDER — PROPOFOL 10 MG/ML IV BOLUS
INTRAVENOUS | Status: DC | PRN
  Administered 2018-10-14 (×2): 20 mg via INTRAVENOUS

## 2018-10-14 MED ORDER — ATORVASTATIN CALCIUM 20 MG PO TABS
20.0000 mg | ORAL_TABLET | Freq: Every evening | ORAL | Status: DC
  Administered 2018-10-14: 20 mg via ORAL
  Filled 2018-10-14: qty 1

## 2018-10-14 MED ORDER — ACETAMINOPHEN 500 MG PO TABS
500.0000 mg | ORAL_TABLET | Freq: Four times a day (QID) | ORAL | Status: AC
  Administered 2018-10-14 – 2018-10-15 (×4): 500 mg via ORAL
  Filled 2018-10-14 (×4): qty 1

## 2018-10-14 MED ORDER — ONDANSETRON HCL 4 MG/2ML IJ SOLN
INTRAMUSCULAR | Status: DC | PRN
  Administered 2018-10-14: 4 mg via INTRAVENOUS

## 2018-10-14 MED ORDER — 0.9 % SODIUM CHLORIDE (POUR BTL) OPTIME
TOPICAL | Status: DC | PRN
  Administered 2018-10-14: 1000 mL

## 2018-10-14 MED ORDER — HYDROMORPHONE HCL 2 MG/ML IJ SOLN
INTRAMUSCULAR | Status: AC
  Filled 2018-10-14: qty 1

## 2018-10-14 MED ORDER — LACTATED RINGERS IV SOLN
INTRAVENOUS | Status: DC
  Administered 2018-10-14 (×2): via INTRAVENOUS

## 2018-10-14 MED ORDER — CHLORHEXIDINE GLUCONATE 4 % EX LIQD: 60.0000 mL | Freq: Once | CUTANEOUS | Status: DC

## 2018-10-14 MED ORDER — DEXAMETHASONE SODIUM PHOSPHATE 10 MG/ML IJ SOLN
INTRAMUSCULAR | Status: DC | PRN
  Administered 2018-10-14: 10 mg via INTRAVENOUS

## 2018-10-14 MED ORDER — HYDROCHLOROTHIAZIDE 12.5 MG PO CAPS
12.5000 mg | ORAL_CAPSULE | Freq: Every day | ORAL | Status: DC
  Administered 2018-10-15: 09:00:00 12.5 mg via ORAL
  Filled 2018-10-14: qty 1

## 2018-10-14 MED ORDER — METHOCARBAMOL 500 MG PO TABS
500.0000 mg | ORAL_TABLET | Freq: Four times a day (QID) | ORAL | Status: DC | PRN
  Administered 2018-10-14 – 2018-10-15 (×2): 500 mg via ORAL
  Filled 2018-10-14 (×2): qty 1

## 2018-10-14 MED ORDER — LACTATED RINGERS IV SOLN
INTRAVENOUS | Status: DC
  Administered 2018-10-14 – 2018-10-15 (×2): via INTRAVENOUS

## 2018-10-14 MED ORDER — BUPIVACAINE-EPINEPHRINE 0.5% -1:200000 IJ SOLN
INTRAMUSCULAR | Status: DC | PRN
  Administered 2018-10-14: 30 mL

## 2018-10-14 MED ORDER — ONDANSETRON HCL 4 MG/2ML IJ SOLN: 4.0000 mg | Freq: Four times a day (QID) | INTRAMUSCULAR | Status: DC | PRN

## 2018-10-14 MED ORDER — PHENOL 1.4 % MT LIQD
1.0000 | OROMUCOSAL | Status: DC | PRN
  Filled 2018-10-14: qty 177

## 2018-10-14 MED ORDER — METOCLOPRAMIDE HCL 5 MG PO TABS: 5.0000 mg | ORAL_TABLET | Freq: Three times a day (TID) | ORAL | Status: DC | PRN

## 2018-10-14 MED ORDER — LIDOCAINE 2% (20 MG/ML) 5 ML SYRINGE
INTRAMUSCULAR | Status: DC | PRN
  Administered 2018-10-14: 50 mg via INTRAVENOUS

## 2018-10-14 MED ORDER — IRBESARTAN 150 MG PO TABS
300.0000 mg | ORAL_TABLET | Freq: Every day | ORAL | Status: DC
  Administered 2018-10-15: 300 mg via ORAL
  Filled 2018-10-14: qty 2

## 2018-10-14 MED ORDER — PHENYLEPHRINE 40 MCG/ML (10ML) SYRINGE FOR IV PUSH (FOR BLOOD PRESSURE SUPPORT)
PREFILLED_SYRINGE | INTRAVENOUS | Status: DC | PRN
  Administered 2018-10-14 (×2): 80 ug via INTRAVENOUS
  Administered 2018-10-14 (×4): 120 ug via INTRAVENOUS
  Administered 2018-10-14: 80 ug via INTRAVENOUS

## 2018-10-14 MED ORDER — ALUM & MAG HYDROXIDE-SIMETH 200-200-20 MG/5ML PO SUSP: 30.0000 mL | ORAL | Status: DC | PRN

## 2018-10-14 MED ORDER — SODIUM CHLORIDE 0.9 % IV SOLN
INTRAVENOUS | Status: DC | PRN
  Administered 2018-10-14: 10:00:00 40 ug/min via INTRAVENOUS

## 2018-10-14 MED ORDER — HYDROCODONE-ACETAMINOPHEN 5-325 MG PO TABS: 1.0000 | ORAL_TABLET | ORAL | Status: DC | PRN

## 2018-10-14 MED ORDER — ASPIRIN 81 MG PO CHEW
81.0000 mg | CHEWABLE_TABLET | Freq: Two times a day (BID) | ORAL | Status: DC
  Administered 2018-10-15: 09:00:00 81 mg via ORAL
  Filled 2018-10-14: qty 1

## 2018-10-14 MED ORDER — MENTHOL 3 MG MT LOZG: 1.0000 | LOZENGE | OROMUCOSAL | Status: DC | PRN

## 2018-10-14 MED ORDER — PHENYLEPHRINE 40 MCG/ML (10ML) SYRINGE FOR IV PUSH (FOR BLOOD PRESSURE SUPPORT)
PREFILLED_SYRINGE | INTRAVENOUS | Status: AC
  Filled 2018-10-14: qty 10

## 2018-10-14 MED ORDER — HYDROCODONE-ACETAMINOPHEN 7.5-325 MG PO TABS
1.0000 | ORAL_TABLET | ORAL | Status: DC | PRN
  Administered 2018-10-14: 2 via ORAL
  Administered 2018-10-14: 1 via ORAL
  Filled 2018-10-14: qty 1
  Filled 2018-10-14: qty 2

## 2018-10-14 MED ORDER — BISACODYL 5 MG PO TBEC: 5.0000 mg | DELAYED_RELEASE_TABLET | Freq: Every day | ORAL | Status: DC | PRN

## 2018-10-14 MED ORDER — INFLUENZA VAC A&B SA ADJ QUAD 0.5 ML IM PRSY
0.5000 mL | PREFILLED_SYRINGE | INTRAMUSCULAR | Status: DC
  Filled 2018-10-14: qty 0.5

## 2018-10-14 MED ORDER — PROPOFOL 500 MG/50ML IV EMUL
INTRAVENOUS | Status: DC | PRN
  Administered 2018-10-14: 25 ug/kg/min via INTRAVENOUS

## 2018-10-14 MED ORDER — ALBUMIN HUMAN 5 % IV SOLN
12.5000 g | Freq: Once | INTRAVENOUS | Status: AC
  Administered 2018-10-14: 12.5 g via INTRAVENOUS

## 2018-10-14 MED ORDER — DEXAMETHASONE SODIUM PHOSPHATE 10 MG/ML IJ SOLN
INTRAMUSCULAR | Status: AC
  Filled 2018-10-14: qty 1

## 2018-10-14 MED ORDER — ACETAMINOPHEN 10 MG/ML IV SOLN: 1000.0000 mg | Freq: Once | INTRAVENOUS | Status: DC | PRN

## 2018-10-14 SURGICAL SUPPLY — 44 items
BAG DECANTER FOR FLEXI CONT (MISCELLANEOUS) ×3 IMPLANT
BLADE SAW SGTL 18X1.27X75 (BLADE) ×2 IMPLANT
BLADE SAW SGTL 18X1.27X75MM (BLADE) ×1
CELLS DAT CNTRL 66122 CELL SVR (MISCELLANEOUS) ×1 IMPLANT
COVER PERINEAL POST (MISCELLANEOUS) ×3 IMPLANT
COVER SURGICAL LIGHT HANDLE (MISCELLANEOUS) ×3 IMPLANT
COVER WAND RF STERILE (DRAPES) IMPLANT
DECANTER SPIKE VIAL GLASS SM (MISCELLANEOUS) ×3 IMPLANT
DRAPE IMP U-DRAPE 54X76 (DRAPES) ×3 IMPLANT
DRAPE STERI IOBAN 125X83 (DRAPES) ×3 IMPLANT
DRAPE U-SHAPE 47X51 STRL (DRAPES) ×6 IMPLANT
DRSG AQUACEL AG ADV 3.5X 6 (GAUZE/BANDAGES/DRESSINGS) ×3 IMPLANT
DURAPREP 26ML APPLICATOR (WOUND CARE) ×3 IMPLANT
ELECT BLADE TIP CTD 4 INCH (ELECTRODE) ×3 IMPLANT
ELECT REM PT RETURN 15FT ADLT (MISCELLANEOUS) ×3 IMPLANT
ELIMINATOR HOLE APEX DEPUY (Hips) ×2 IMPLANT
GLOVE BIO SURGEON STRL SZ8 (GLOVE) ×6 IMPLANT
GLOVE BIOGEL PI IND STRL 8 (GLOVE) ×2 IMPLANT
GLOVE BIOGEL PI INDICATOR 8 (GLOVE) ×4
GOWN STRL REUS W/TWL XL LVL3 (GOWN DISPOSABLE) ×6 IMPLANT
HEAD FEMORAL 32 CERAMIC (Hips) ×2 IMPLANT
HOLDER FOLEY CATH W/STRAP (MISCELLANEOUS) ×3 IMPLANT
KIT TURNOVER KIT A (KITS) IMPLANT
LINER ACETABULAR 32X50 (Liner) ×2 IMPLANT
LINER PINN ACET GRIP 50X100 ×2 IMPLANT
MANIFOLD NEPTUNE II (INSTRUMENTS) ×3 IMPLANT
NEEDLE HYPO 22GX1.5 SAFETY (NEEDLE) ×3 IMPLANT
NS IRRIG 1000ML POUR BTL (IV SOLUTION) ×3 IMPLANT
PACK ANTERIOR HIP CUSTOM (KITS) ×3 IMPLANT
PROTECTOR NERVE ULNAR (MISCELLANEOUS) ×3 IMPLANT
RETRACTOR WND ALEXIS 18 MED (MISCELLANEOUS) ×1 IMPLANT
RTRCTR WOUND ALEXIS 18CM MED (MISCELLANEOUS) ×3
STEM FEM ACTIS STD SZ4 (Stem) ×2 IMPLANT
SUT ETHIBOND NAB CT1 #1 30IN (SUTURE) ×6 IMPLANT
SUT VIC AB 1 CT1 36 (SUTURE) ×3 IMPLANT
SUT VIC AB 2-0 CT1 27 (SUTURE) ×3
SUT VIC AB 2-0 CT1 TAPERPNT 27 (SUTURE) ×1 IMPLANT
SUT VIC AB 3-0 PS2 18 (SUTURE) ×3
SUT VIC AB 3-0 PS2 18XBRD (SUTURE) ×1 IMPLANT
SUT VLOC 180 0 24IN GS25 (SUTURE) ×3 IMPLANT
SYR 50ML LL SCALE MARK (SYRINGE) ×3 IMPLANT
TRAY FOLEY MTR SLVR 14FR STAT (SET/KITS/TRAYS/PACK) ×2 IMPLANT
YANKAUER SUCT BULB TIP 10FT TU (MISCELLANEOUS) ×3 IMPLANT
YANKAUER SUCT BULB TIP NO VENT (SUCTIONS) ×2 IMPLANT

## 2018-10-14 NOTE — Interval H&P Note (Signed)
History and Physical Interval Note:  10/14/2018 8:53 AM  Susan Holland  has presented today for surgery, with the diagnosis of Right Hip Degenerative Joint Disease.  The various methods of treatment have been discussed with the patient and family. After consideration of risks, benefits and other options for treatment, the patient has consented to  Procedure(s): RIght Anterior Hip Arthroplasty (Right) as a surgical intervention.  The patient's history has been reviewed, patient examined, no change in status, stable for surgery.  I have reviewed the patient's chart and labs.  Questions were answered to the patient's satisfaction.     [name]

## 2018-10-14 NOTE — Op Note (Signed)

## 2018-10-14 NOTE — Evaluation (Signed)
Physical Therapy Evaluation Patient Details Name: Susan Holland MRN: [id number] DOB: [birth year] Today's Date: 10/14/2018   History of Present Illness  R DA-THA  Clinical Impression  Pt is s/p THA resulting in the deficits listed below (see PT Problem List).  Pt ambulated 100' with RW, initiated THA HEP. Pt highly motivated, good progress expected.  Pt will benefit from skilled PT to increase their independence and safety with mobility to allow discharge to the venue listed below.      Follow Up Recommendations Follow surgeon's recommendation for DC plan and follow-up therapies    Equipment Recommendations  None recommended by PT    Recommendations for Other Services       Precautions / Restrictions Precautions Precautions: Fall Restrictions Weight Bearing Restrictions: No Other Position/Activity Restrictions: WBAT      Mobility  Bed Mobility Overal bed mobility: Modified Independent             General bed mobility comments: HOB up  Transfers Overall transfer level: Needs assistance Equipment used: Rolling walker (2 wheeled) Transfers: Sit to/from Stand Sit to Stand: Min guard         General transfer comment: VCs hand placement  Ambulation/Gait Ambulation/Gait assistance: Min guard Gait Distance (Feet): 100 Feet Assistive device: Rolling walker (2 wheeled) Gait Pattern/deviations: Step-through pattern;Decreased stride length Gait velocity: WFL   General Gait Details: VCs sequencing, no loss of balance  Stairs            Wheelchair Mobility    Modified Rankin (Stroke Patients Only)       Balance Overall balance assessment: Modified Independent                                           Pertinent Vitals/Pain Pain Assessment: 0-10 Pain Score: 5  Pain Location: R knee(pt reports pain has always been more in her knee than hip) Pain Descriptors / Indicators: Sore Pain Intervention(s): Limited activity within patient's  tolerance;Monitored during session;Premedicated before session;Ice applied    Home Living Family/patient expects to be discharged to:: Private residence Living Arrangements: Children Available Help at Discharge: Family Type of Home: House Home Access: Stairs to enter Entrance Stairs-Rails: Left Entrance Stairs-Number of Steps: 6 Home Layout: One level Home Equipment: Environmental consultant - 2 wheels;Cane - single point;Bedside commode      Prior Function Level of Independence: Independent with assistive device(s)         Comments: used cane prn     Hand Dominance        Extremity/Trunk Assessment   Upper Extremity Assessment Upper Extremity Assessment: Overall WFL for tasks assessed    Lower Extremity Assessment Lower Extremity Assessment: RLE deficits/detail RLE Deficits / Details: knee ext -4/5, hip AAROM WFL RLE Sensation: WNL RLE Coordination: WNL    Cervical / Trunk Assessment Cervical / Trunk Assessment: Normal  Communication   Communication: No difficulties  Cognition Arousal/Alertness: Awake/alert Behavior During Therapy: WFL for tasks assessed/performed Overall Cognitive Status: Within Functional Limits for tasks assessed                                        General Comments      Exercises Total Joint Exercises Ankle Circles/Pumps: AROM;Both;10 reps;Supine Heel Slides: AAROM;Right;10 reps;Supine Hip ABduction/ADduction: AAROM;Right;10 reps;Supine   Assessment/Plan  PT Assessment Patient needs continued PT services  PT Problem List Decreased strength;Decreased activity tolerance;Decreased knowledge of use of DME;Decreased mobility;Pain       PT Treatment Interventions Gait training;Functional mobility training;Therapeutic exercise;Therapeutic activities;Patient/family education    PT Goals (Current goals can be found in the Care Plan section)  Acute Rehab PT Goals Patient Stated Goal: to do karate PT Goal Formulation: With  patient/family Time For Goal Achievement: 10/21/18 Potential to Achieve Goals: Good    Frequency 7X/week   Barriers to discharge        Co-evaluation               AM-PAC PT "6 Clicks" Mobility  Outcome Measure Help needed turning from your back to your side while in a flat bed without using bedrails?: A Little Help needed moving from lying on your back to sitting on the side of a flat bed without using bedrails?: A Little Help needed moving to and from a bed to a chair (including a wheelchair)?: A Little Help needed standing up from a chair using your arms (e.g., wheelchair or bedside chair)?: A Little Help needed to walk in hospital room?: A Little Help needed climbing 3-5 steps with a railing? : A Lot 6 Click Score: 17    End of Session Equipment Utilized During Treatment: Gait belt Activity Tolerance: Patient tolerated treatment well Patient left: in chair;with call bell/phone within reach;with family/visitor present Nurse Communication: Mobility status PT Visit Diagnosis: Difficulty in walking, not elsewhere classified (R26.2);Pain Pain - Right/Left: Right Pain - part of body: Knee(pt reports pain is more in knee than hip)    Time: [id number] PT Time Calculation (min) (ACUTE ONLY): 18 min   Charges:   PT Evaluation $PT Eval Low Complexity: 1 Low          Blondell Reveal Kistler PT 10/14/2018  Acute Rehabilitation Services Pager [phone] Office [phone]

## 2018-10-14 NOTE — Transfer of Care (Signed)
Immediate Anesthesia Transfer of Care Note  Patient: Susan Holland  Procedure(s) Performed: RIght Anterior Hip Arthroplasty (Right Hip)  Patient Location: PACU  Anesthesia Type:Spinal  Level of Consciousness: drowsy, patient cooperative and responds to stimulation  Airway & Oxygen Therapy: Patient Spontanous Breathing and Patient connected to face mask oxygen  Post-op Assessment: Report given to RN and Post -op Vital signs reviewed and stable  Post vital signs: Reviewed and stable  Last Vitals:  Vitals Value Taken Time  BP 80/51 10/14/18 1140  Temp    Pulse 67 10/14/18 1141  Resp 9 10/14/18 1141  SpO2 99 % 10/14/18 1141  Vitals shown include unvalidated device data.  Last Pain:  Vitals:   10/14/18 0759  TempSrc:   PainSc: 8       Patients Stated Pain Goal: 6 ([phone])  Complications: No apparent anesthesia complications

## 2018-10-14 NOTE — Anesthesia Procedure Notes (Signed)
Spinal  Patient location during procedure: OR Start time: 10/14/2018 10:01 AM End time: 10/14/2018 10:07 AM Staffing Anesthesiologist: Barnet Glasgow, MD Performed: anesthesiologist  Preanesthetic Checklist Completed: patient identified, site marked, surgical consent, pre-op evaluation, timeout performed, IV checked, risks and benefits discussed and monitors and equipment checked Spinal Block Patient position: sitting Prep: DuraPrep Patient monitoring: heart rate, cardiac monitor, continuous pulse ox and blood pressure Approach: midline Location: L3-4 Injection technique: single-shot Needle Needle type: Sprotte  Needle gauge: 24 G Needle length: 9 cm Needle insertion depth: 5 cm Assessment Sensory level: T4 Additional Notes 1 attempt At l3-4

## 2018-10-14 NOTE — Anesthesia Postprocedure Evaluation (Signed)
Anesthesia Post Note  Patient: Susan Holland  Procedure(s) Performed: RIght Anterior Hip Arthroplasty (Right Hip)     Patient location during evaluation: PACU Anesthesia Type: Spinal Level of consciousness: awake and alert Pain management: pain level controlled Vital Signs Assessment: post-procedure vital signs reviewed and stable Respiratory status: spontaneous breathing and respiratory function stable Cardiovascular status: blood pressure returned to baseline and stable Postop Assessment: spinal receding Anesthetic complications: no    Last Vitals:  Vitals:   10/14/18 1245 10/14/18 1301  BP: 103/60 100/70  Pulse: 61 61  Resp: 10 14  Temp: (!) 36.4 C (!) 36.3 C  SpO2: 100% 100%    Last Pain:  Vitals:   10/14/18 1302  TempSrc:   PainSc: 0-No pain                 Barnet Glasgow

## 2018-10-15 ENCOUNTER — Encounter (HOSPITAL_COMMUNITY): Payer: Self-pay | Admitting: Orthopaedic Surgery

## 2018-10-15 ENCOUNTER — Telehealth: Payer: Self-pay | Admitting: *Deleted

## 2018-10-15 DIAGNOSIS — M1611 Unilateral primary osteoarthritis, right hip: Secondary | ICD-10-CM | POA: Diagnosis not present

## 2018-10-15 MED ORDER — ASPIRIN 81 MG PO CHEW: 81.0000 mg | CHEWABLE_TABLET | Freq: Two times a day (BID) | ORAL | 0 refills | Status: DC

## 2018-10-15 MED ORDER — TIZANIDINE HCL 4 MG PO TABS: 4.0000 mg | ORAL_TABLET | Freq: Four times a day (QID) | ORAL | 1 refills | Status: DC | PRN

## 2018-10-15 MED ORDER — HYDROCODONE-ACETAMINOPHEN 5-325 MG PO TABS: 1.0000 | ORAL_TABLET | Freq: Four times a day (QID) | ORAL | 0 refills | Status: DC | PRN

## 2018-10-15 NOTE — Progress Notes (Signed)
Physical Therapy Treatment Patient Details Name: Susan Holland MRN: [id number] DOB: 1945/11/01 Today's Date: 10/15/2018    History of Present Illness R DA-THA    PT Comments    Pt has met PT goals and is ready to DC home from PT standpoint. She ambulated 200', completed stair training, and demonstrates good understanding of HEP.   Follow Up Recommendations  Follow surgeon's recommendation for DC plan and follow-up therapies     Equipment Recommendations  None recommended by PT    Recommendations for Other Services       Precautions / Restrictions Precautions Precautions: Fall Restrictions Weight Bearing Restrictions: No Other Position/Activity Restrictions: WBAT    Mobility  Bed Mobility Overal bed mobility: Modified Independent             General bed mobility comments: HOB up  Transfers Overall transfer level: Needs assistance Equipment used: Rolling walker (2 wheeled) Transfers: Sit to/from Stand Sit to Stand: Supervision         General transfer comment: VCs hand placement  Ambulation/Gait Ambulation/Gait assistance: Supervision Gait Distance (Feet): 200 Feet Assistive device: Rolling walker (2 wheeled) Gait Pattern/deviations: Step-through pattern;Decreased stride length Gait velocity: WFL   General Gait Details: VCs sequencing, no loss of balance   Stairs Stairs: Yes Stairs assistance: Supervision Stair Management: One rail Right;Forwards;Step to pattern;With cane Number of Stairs: 5 General stair comments: VCs sequencing   Wheelchair Mobility    Modified Rankin (Stroke Patients Only)       Balance Overall balance assessment: Modified Independent                                          Cognition Arousal/Alertness: Awake/alert Behavior During Therapy: WFL for tasks assessed/performed Overall Cognitive Status: Within Functional Limits for tasks assessed                                        Exercises Total Joint Exercises Ankle Circles/Pumps: AROM;Both;10 reps;Supine Quad Sets: AROM;Right;5 reps;Supine Short Arc Quad: AROM;Right;10 reps;Supine Heel Slides: AAROM;Right;10 reps;Supine Hip ABduction/ADduction: AAROM;Right;10 reps;Supine;Standing(x 10 standing, x 10 supine) Long Arc Quad: AROM;Right;10 reps;Seated Knee Flexion: AROM;Right;5 reps;Standing Marching in Standing: AROM;Right;5 reps;Standing Standing Hip Extension: AROM;Right;5 reps;Standing    General Comments        Pertinent Vitals/Pain Pain Score: 6  Pain Location: R hip with activity Pain Descriptors / Indicators: Sore Pain Intervention(s): Limited activity within patient's tolerance;Monitored during session;Premedicated before session;Ice applied    Home Living                      Prior Function            PT Goals (current goals can now be found in the care plan section) Acute Rehab PT Goals Patient Stated Goal: to do karate PT Goal Formulation: With patient/family Time For Goal Achievement: 10/21/18 Potential to Achieve Goals: Good Progress towards PT goals: Goals met/education completed, patient discharged from PT    Frequency    7X/week      PT Plan Current plan remains appropriate    Co-evaluation              AM-PAC PT "6 Clicks" Mobility   Outcome Measure  Help needed turning from your back to your side while in a flat bed  without using bedrails?: A Little Help needed moving from lying on your back to sitting on the side of a flat bed without using bedrails?: A Little Help needed moving to and from a bed to a chair (including a wheelchair)?: A Little Help needed standing up from a chair using your arms (e.g., wheelchair or bedside chair)?: A Little Help needed to walk in hospital room?: A Little Help needed climbing 3-5 steps with a railing? : A Little 6 Click Score: 18    End of Session Equipment Utilized During Treatment: Gait belt Activity Tolerance:  Patient tolerated treatment well Patient left: in chair;with call bell/phone within reach;with family/visitor present Nurse Communication: Mobility status PT Visit Diagnosis: Difficulty in walking, not elsewhere classified (R26.2);Pain Pain - Right/Left: Right Pain - part of body: Knee(pt reports pain is more in knee than hip)     Time: 1003-1040 PT Time Calculation (min) (ACUTE ONLY): 37 min  Charges:  $Gait Training: 8-22 mins $Therapeutic Exercise: 8-22 mins                     Blondell Reveal Kistler PT 10/15/2018  Acute Rehabilitation Services Pager [phone] Office [phone]

## 2018-10-15 NOTE — TOC Transition Note (Signed)
Transition of Care [facility]) - CM/SW Discharge Note   Patient Details  Name: BRYER HIROSE MRN: [id number] Date of Birth: 03/24/1945  Transition of Care [facility]) CM/SW Contact:  Leeroy Cha, RN Phone Number: 10/15/2018, 9:58 AM   Clinical Narrative:    [facility]   Final next level of care: [employer] Barriers to Discharge: No Barriers Identified   Patient Goals and CMS Choice Patient states their goals for this hospitalization and ongoing recovery are:: to go home CMS Medicare.gov Compare Post Acute Care list provided to:: Patient    Discharge Placement                       Discharge Plan and Services   Discharge Planning Services: CM Consult Post Acute Care Choice: Durable Medical Equipment, Home Health          DME Arranged: Walker rolling, 3-N-1 DME Agency: [facility] Date DME Agency Contacted: 10/15/18 Time DME Agency Contacted: 0900 Representative spoke with at DME Agency: nathan HH Arranged: PT [place]: [facility] [employer] Date [place]: 10/15/18 Time [place]: 0900 Representative spoke with at [place]: [place] ([place]) Interventions     Readmission Risk Interventions No flowsheet data found.

## 2018-10-15 NOTE — Progress Notes (Signed)
Subjective: 1 Day Post-Op Procedure(s) (LRB): RIght Anterior Hip Arthroplasty (Right)   Patient feels well and states pain is better this morning. She is looking forward to getting up and moving with PT.  Activity level:  wbat Diet tolerance:  ok Voiding:  Foley out this morning Patient reports pain as mild.    Objective: Vital signs in last 24 hours: Temp:  [97.4 F (36.3 C)-99 F (37.2 C)] 98.8 F (37.1 C) (09/09 0446) Pulse Rate:  [58-72] 58 (09/09 0446) Resp:  [9-20] 14 (09/09 0446) BP: (80-140)/(51-76) 108/62 (09/09 0446) SpO2:  [98 %-100 %] 99 % (09/09 0446) FiO2 (%):  [2 %] 2 % (09/08 1403)  Labs: No results for input(s): HGB in the last 72 hours. No results for input(s): WBC, RBC, HCT, PLT in the last 72 hours. No results for input(s): NA, K, CL, CO2, BUN, CREATININE, GLUCOSE, CALCIUM in the last 72 hours. No results for input(s): LABPT, INR in the last 72 hours.  Physical Exam:  Neurologically intact ABD soft Neurovascular intact Sensation intact distally Intact pulses distally Dorsiflexion/Plantar flexion intact Incision: dressing C/D/I and no drainage No cellulitis present Compartment soft  Assessment/Plan:  1 Day Post-Op Procedure(s) (LRB): RIght Anterior Hip Arthroplasty (Right) Advance diet Up with therapy D/C IV fluids Discharge home with home health today after PT. Continue on 81mg  asa BID x 4 weeks for DVT prevention. Follow up in office 2 weeks post op.  Larwance Sachs Doniel Maiello 10/15/2018, 7:59 AM

## 2018-10-15 NOTE — Discharge Summary (Signed)
Patient ID: Susan Holland MRN: [id number] DOB/AGE: 1945/12/13 73 y.o.  Admit date: 10/14/2018 Discharge date: 10/15/2018  Admission Diagnoses:  Principal Problem:   Primary localized osteoarthritis of right hip Active Problems:   Primary osteoarthritis of right hip   Discharge Diagnoses:  Same  Past Medical History:  Diagnosis Date  . Colon polyp   . Diverticulosis   . Glaucoma suspect   . Hyperlipidemia   . Hypertension     Surgeries: Procedure(s): RIght Anterior Hip Arthroplasty on 10/14/2018   Consultants:   Discharged Condition: Improved  Hospital Course: Susan Holland is an 73 y.o. female who was admitted 10/14/2018 for operative treatment ofPrimary localized osteoarthritis of right hip. Patient has severe unremitting pain that affects sleep, daily activities, and work/hobbies. After pre-op clearance the patient was taken to the operating room on 10/14/2018 and underwent  Procedure(s): RIght Anterior Hip Arthroplasty.    Patient was given perioperative antibiotics:  Anti-infectives (From admission, onward)   Start     Dose/Rate Route Frequency Ordered Stop   10/14/18 1600  ceFAZolin (ANCEF) IVPB 2g/100 mL premix     2 g 200 mL/hr over 30 Minutes Intravenous Every 6 hours 10/14/18 1259 10/14/18 2234   10/14/18 0745  ceFAZolin (ANCEF) IVPB 2g/100 mL premix     2 g 200 mL/hr over 30 Minutes Intravenous On call to O.R. 10/14/18 [id number] 10/14/18 1012       Patient was given sequential compression devices, early ambulation, and chemoprophylaxis to prevent DVT.  Patient benefited maximally from hospital stay and there were no complications.    Recent vital signs:  Patient Vitals for the past 24 hrs:  BP Temp Temp src Pulse Resp SpO2  10/15/18 0446 108/62 98.8 F (37.1 C) Oral (!) 58 14 99 %  10/15/18 0122 116/63 98.4 F (36.9 C) Oral 64 14 98 %  10/14/18 2150 129/71 99 F (37.2 C) Oral 70 15 98 %  10/14/18 1654 136/66 - - 72 20 100 %  10/14/18 1530 134/69 98 F  (36.7 C) Oral 69 18 100 %  10/14/18 1403 140/76 97.8 F (36.6 C) Oral 66 16 100 %  10/14/18 1301 100/70 (!) 97.4 F (36.3 C) Oral 61 14 100 %  10/14/18 1245 103/60 (!) 97.5 F (36.4 C) - 61 10 100 %  10/14/18 1230 91/64 - - 62 14 99 %  10/14/18 1215 (!) 102/58 - - 68 15 100 %  10/14/18 1200 91/61 - - 67 12 99 %  10/14/18 1145 (!) 81/51 - - 65 (!) 9 100 %  10/14/18 1141 (!) 80/51 97.7 F (36.5 C) - 65 10 99 %     Recent laboratory studies: No results for input(s): WBC, HGB, HCT, PLT, NA, K, CL, CO2, BUN, CREATININE, GLUCOSE, INR, CALCIUM in the last 72 hours.  Invalid input(s): PT, 2   Discharge Medications:   Allergies as of 10/15/2018   No Known Allergies     Medication List    STOP taking these medications   meloxicam 15 MG tablet Commonly known as: MOBIC     TAKE these medications   acetaminophen 650 MG CR tablet Commonly known as: TYLENOL Take 1,300 mg by mouth every 8 (eight) hours as needed for pain.   aspirin 81 MG chewable tablet Chew 1 tablet (81 mg total) by mouth 2 (two) times daily.   atorvastatin 20 MG tablet Commonly known as: LIPITOR Take 1 tablet (20 mg total) by mouth daily. What changed: when to take this  CALCIUM + D PO Take 1 tablet by mouth 2 (two) times daily.   Fish Oil 1200 MG Caps Take 1,200 mg by mouth 2 (two) times daily.   HYDROcodone-acetaminophen 5-325 MG tablet Commonly known as: NORCO/VICODIN Take 1-2 tablets by mouth every 6 (six) hours as needed for moderate pain (pain score 4-6).   multivitamin with minerals Tabs tablet Take 1 tablet by mouth daily.   telmisartan-hydrochlorothiazide 80-12.5 MG tablet Commonly known as: MICARDIS HCT Take 1 tablet by mouth daily.   tiZANidine 4 MG tablet Commonly known as: Zanaflex Take 1 tablet (4 mg total) by mouth every 6 (six) hours as needed.            Durable Medical Equipment  (From admission, onward)         Start     Ordered   10/14/18 1300  DME Walker rolling   Once    Question:  Patient needs a walker to treat with the following condition  Answer:  Primary osteoarthritis of right hip   10/14/18 1259   10/14/18 1300  DME 3 n 1  Once     10/14/18 1259   10/14/18 1300  DME Bedside commode  Once    Question:  Patient needs a bedside commode to treat with the following condition  Answer:  Primary osteoarthritis of right hip   10/14/18 1259          Diagnostic Studies: Dg Chest 2 View  Result Date: 10/08/2018 CLINICAL DATA:  Preoperative chest radiograph prior to hip replacement. EXAM: CHEST - 2 VIEW COMPARISON:  04/13/2013 FINDINGS: The cardiomediastinal silhouette is unremarkable. There is no evidence of focal airspace disease, pulmonary edema, suspicious pulmonary nodule/mass, pleural effusion, or pneumothorax. No acute bony abnormalities are identified. IMPRESSION: No active cardiopulmonary disease. Electronically Signed   By: Margarette Canada M.D.   On: 10/08/2018 14:47   Dg C-arm 1-60 Min-no Report  Result Date: 10/14/2018 Fluoroscopy was utilized by the requesting physician.  No radiographic interpretation.   Dg Hip Operative Unilat W Or W/o Pelvis Right  Result Date: 10/14/2018 CLINICAL DATA:  Right hip replacement. EXAM: OPERATIVE right HIP (WITH PELVIS IF PERFORMED) 4 VIEWS TECHNIQUE: Fluoroscopic spot image(s) were submitted for interpretation post-operatively. Radiation exposure index: 1.5191 mGy. COMPARISON:  Radiographs of July 17, 2018. FINDINGS: Four fluoroscopic images were obtained of the right hip. These demonstrate the right femoral and acetabular components to be well situated. IMPRESSION: Fluoroscopic guidance provided during right total hip arthroplasty. Electronically Signed   By: Marijo Conception M.D.   On: 10/14/2018 11:35    Disposition: Discharge disposition: 01-Home or Self Care       Discharge Instructions    Call MD / Call 911   Complete by: As directed    If you experience chest pain or shortness of breath, CALL 911 and  be transported to the hospital emergency room.  If you develope a fever above 101 F, pus (white drainage) or increased drainage or redness at the wound, or calf pain, call your surgeon's office.   Constipation Prevention   Complete by: As directed    Drink plenty of fluids.  Prune juice may be helpful.  You may use a stool softener, such as Colace (over the counter) 100 mg twice a day.  Use MiraLax (over the counter) for constipation as needed.   Diet - low sodium heart healthy   Complete by: As directed    Discharge instructions   Complete by: As directed  INSTRUCTIONS AFTER JOINT REPLACEMENT   Remove items at home which could result in a fall. This includes throw rugs or furniture in walking pathways ICE to the affected joint every three hours while awake for 30 minutes at a time, for at least the first 3-5 days, and then as needed for pain and swelling.  Continue to use ice for pain and swelling. You may notice swelling that will progress down to the foot and ankle.  This is normal after surgery.  Elevate your leg when you are not up walking on it.   Continue to use the breathing machine you got in the hospital (incentive spirometer) which will help keep your temperature down.  It is common for your temperature to cycle up and down following surgery, especially at night when you are not up moving around and exerting yourself.  The breathing machine keeps your lungs expanded and your temperature down.   DIET:  As you were doing prior to hospitalization, we recommend a well-balanced diet.  DRESSING / WOUND CARE / SHOWERING  You may shower 3 days after surgery, but keep the wounds dry during showering.  You may use an occlusive plastic wrap (Press'n Seal for example), NO SOAKING/SUBMERGING IN THE BATHTUB.  If the bandage gets wet, change with a clean dry gauze.  If the incision gets wet, pat the wound dry with a clean towel.  ACTIVITY  Increase activity slowly as tolerated, but follow the  weight bearing instructions below.   No driving for 6 weeks or until further direction given by your physician.  You cannot drive while taking narcotics.  No lifting or carrying greater than 10 lbs. until further directed by your surgeon. Avoid periods of inactivity such as sitting longer than an hour when not asleep. This helps prevent blood clots.  You may return to work once you are authorized by your doctor.     WEIGHT BEARING   Weight bearing as tolerated with assist device (walker, cane, etc) as directed, use it as long as suggested by your surgeon or therapist, typically at least 4-6 weeks.   EXERCISES  Results after joint replacement surgery are often greatly improved when you follow the exercise, range of motion and muscle strengthening exercises prescribed by your doctor. Safety measures are also important to protect the joint from further injury. Any time any of these exercises cause you to have increased pain or swelling, decrease what you are doing until you are comfortable again and then slowly increase them. If you have problems or questions, call your caregiver or physical therapist for advice.   Rehabilitation is important following a joint replacement. After just a few days of immobilization, the muscles of the leg can become weakened and shrink (atrophy).  These exercises are designed to build up the tone and strength of the thigh and leg muscles and to improve motion. Often times heat used for twenty to thirty minutes before working out will loosen up your tissues and help with improving the range of motion but do not use heat for the first two weeks following surgery (sometimes heat can increase post-operative swelling).   These exercises can be done on a training (exercise) mat, on the floor, on a table or on a bed. Use whatever works the best and is most comfortable for you.    Use music or television while you are exercising so that the exercises are a pleasant break in  your day. This will make your life better with the exercises  acting as a break in your routine that you can look forward to.   Perform all exercises about fifteen times, three times per day or as directed.  You should exercise both the operative leg and the other leg as well.   Exercises include:   Quad Sets - Tighten up the muscle on the front of the thigh (Quad) and hold for 5-10 seconds.   Straight Leg Raises - With your knee straight (if you were given a brace, keep it on), lift the leg to 60 degrees, hold for 3 seconds, and slowly lower the leg.  Perform this exercise against resistance later as your leg gets stronger.  Leg Slides: Lying on your back, slowly slide your foot toward your buttocks, bending your knee up off the floor (only go as far as is comfortable). Then slowly slide your foot back down until your leg is flat on the floor again.  Angel Wings: Lying on your back spread your legs to the side as far apart as you can without causing discomfort.  Hamstring Strength:  Lying on your back, push your heel against the floor with your leg straight by tightening up the muscles of your buttocks.  Repeat, but this time bend your knee to a comfortable angle, and push your heel against the floor.  You may put a pillow under the heel to make it more comfortable if necessary.   A rehabilitation program following joint replacement surgery can speed recovery and prevent re-injury in the future due to weakened muscles. Contact your doctor or a physical therapist for more information on knee rehabilitation.    CONSTIPATION  Constipation is defined medically as fewer than three stools per week and severe constipation as less than one stool per week.  Even if you have a regular bowel pattern at home, your normal regimen is likely to be disrupted due to multiple reasons following surgery.  Combination of anesthesia, postoperative narcotics, change in appetite and fluid intake all can affect your bowels.    YOU MUST use at least one of the following options; they are listed in order of increasing strength to get the job done.  They are all available over the counter, and you may need to use some, POSSIBLY even all of these options:    Drink plenty of fluids (prune juice may be helpful) and high fiber foods Colace 100 mg by mouth twice a day  Senokot for constipation as directed and as needed Dulcolax (bisacodyl), take with full glass of water  Miralax (polyethylene glycol) once or twice a day as needed.  If you have tried all these things and are unable to have a bowel movement in the first 3-4 days after surgery call either your surgeon or your primary doctor.    If you experience loose stools or diarrhea, hold the medications until you stool forms back up.  If your symptoms do not get better within 1 week or if they get worse, check with your doctor.  If you experience "the worst abdominal pain ever" or develop nausea or vomiting, please contact the office immediately for further recommendations for treatment.   ITCHING:  If you experience itching with your medications, try taking only a single pain pill, or even half a pain pill at a time.  You can also use Benadryl over the counter for itching or also to help with sleep.   TED HOSE STOCKINGS:  Use stockings on both legs until for at least 2 weeks or as directed  by physician office. They may be removed at night for sleeping.  MEDICATIONS:  See your medication summary on the "After Visit Summary" that nursing will review with you.  You may have some home medications which will be placed on hold until you complete the course of blood thinner medication.  It is important for you to complete the blood thinner medication as prescribed.  PRECAUTIONS:  If you experience chest pain or shortness of breath - call 911 immediately for transfer to the hospital emergency department.   If you develop a fever greater that 101 F, purulent drainage from wound,  increased redness or drainage from wound, foul odor from the wound/dressing, or calf pain - CONTACT YOUR SURGEON.                                                   FOLLOW-UP APPOINTMENTS:  If you do not already have a post-op appointment, please call the office for an appointment to be seen by your surgeon.  Guidelines for how soon to be seen are listed in your "After Visit Summary", but are typically between 1-4 weeks after surgery.  OTHER INSTRUCTIONS:   Knee Replacement:  Do not place pillow under knee, focus on keeping the knee straight while resting. CPM instructions: 0-90 degrees, 2 hours in the morning, 2 hours in the afternoon, and 2 hours in the evening. Place foam block, curve side up under heel at all times except when in CPM or when walking.  DO NOT modify, tear, cut, or change the foam block in any way.  MAKE SURE YOU:  Understand these instructions.  Get help right away if you are not doing well or get worse.    Thank you for letting us be a part of your medical care team.  It is a privilege we respect greatly.  We hope these instructions will help you stay on track for a fast and full recovery!   Increase activity slowly as tolerated   Complete by: As directed       Follow-up Information    Melrose Nakayama, MD. Schedule an appointment as soon as possible for a visit in 2 weeks.   Specialty: Orthopedic Surgery Contact information: [place] [address] [phone]            Signed: Larwance Sachs Ianmichael Amescua 10/15/2018, 8:05 AM

## 2018-10-15 NOTE — Plan of Care (Signed)
Patient discharged home in stable condition 

## 2018-10-15 NOTE — Telephone Encounter (Signed)
Pt was on TCM report she was admitted  10/14/2018 for operative treatment ofPrimary localized osteoarthritis of right hip. Patient has severe unremitting pain that affects sleep, daily activities, and work/hobbies. After pre-op clearance the patient was taken to the operating room and underwent  Procedure(s): RIght Anterior Hip Arthroplasty. Pt tolerated procedure well. Pt was D/ C 10/15/18 and will f/u w/specialist in 2 weeks.Marland KitchenJohny Chess

## 2018-10-16 DIAGNOSIS — Z471 Aftercare following joint replacement surgery: Secondary | ICD-10-CM | POA: Diagnosis not present

## 2018-10-16 DIAGNOSIS — Z9181 History of falling: Secondary | ICD-10-CM | POA: Diagnosis not present

## 2018-10-16 DIAGNOSIS — Z87891 Personal history of nicotine dependence: Secondary | ICD-10-CM | POA: Diagnosis not present

## 2018-10-16 DIAGNOSIS — Z86010 Personal history of colonic polyps: Secondary | ICD-10-CM | POA: Diagnosis not present

## 2018-10-16 DIAGNOSIS — I10 Essential (primary) hypertension: Secondary | ICD-10-CM | POA: Diagnosis not present

## 2018-10-16 DIAGNOSIS — M810 Age-related osteoporosis without current pathological fracture: Secondary | ICD-10-CM | POA: Diagnosis not present

## 2018-10-16 DIAGNOSIS — K5790 Diverticulosis of intestine, part unspecified, without perforation or abscess without bleeding: Secondary | ICD-10-CM | POA: Diagnosis not present

## 2018-10-16 DIAGNOSIS — Z96641 Presence of right artificial hip joint: Secondary | ICD-10-CM | POA: Diagnosis not present

## 2018-10-16 DIAGNOSIS — Z85828 Personal history of other malignant neoplasm of skin: Secondary | ICD-10-CM | POA: Diagnosis not present

## 2018-10-16 DIAGNOSIS — E119 Type 2 diabetes mellitus without complications: Secondary | ICD-10-CM | POA: Diagnosis not present

## 2018-10-16 DIAGNOSIS — E785 Hyperlipidemia, unspecified: Secondary | ICD-10-CM | POA: Diagnosis not present

## 2018-10-22 DIAGNOSIS — Z9181 History of falling: Secondary | ICD-10-CM | POA: Diagnosis not present

## 2018-10-22 DIAGNOSIS — K5790 Diverticulosis of intestine, part unspecified, without perforation or abscess without bleeding: Secondary | ICD-10-CM | POA: Diagnosis not present

## 2018-10-22 DIAGNOSIS — E119 Type 2 diabetes mellitus without complications: Secondary | ICD-10-CM | POA: Diagnosis not present

## 2018-10-22 DIAGNOSIS — M810 Age-related osteoporosis without current pathological fracture: Secondary | ICD-10-CM | POA: Diagnosis not present

## 2018-10-22 DIAGNOSIS — E785 Hyperlipidemia, unspecified: Secondary | ICD-10-CM | POA: Diagnosis not present

## 2018-10-22 DIAGNOSIS — Z471 Aftercare following joint replacement surgery: Secondary | ICD-10-CM | POA: Diagnosis not present

## 2018-10-22 DIAGNOSIS — Z96641 Presence of right artificial hip joint: Secondary | ICD-10-CM | POA: Diagnosis not present

## 2018-10-22 DIAGNOSIS — I10 Essential (primary) hypertension: Secondary | ICD-10-CM | POA: Diagnosis not present

## 2018-10-22 DIAGNOSIS — Z85828 Personal history of other malignant neoplasm of skin: Secondary | ICD-10-CM | POA: Diagnosis not present

## 2018-10-22 DIAGNOSIS — Z86010 Personal history of colonic polyps: Secondary | ICD-10-CM | POA: Diagnosis not present

## 2018-10-22 DIAGNOSIS — Z87891 Personal history of nicotine dependence: Secondary | ICD-10-CM | POA: Diagnosis not present

## 2018-10-24 DIAGNOSIS — M1611 Unilateral primary osteoarthritis, right hip: Secondary | ICD-10-CM | POA: Diagnosis not present

## 2018-10-24 DIAGNOSIS — Z9889 Other specified postprocedural states: Secondary | ICD-10-CM | POA: Diagnosis not present

## 2018-10-27 DIAGNOSIS — Z96641 Presence of right artificial hip joint: Secondary | ICD-10-CM | POA: Diagnosis not present

## 2018-10-27 DIAGNOSIS — M6281 Muscle weakness (generalized): Secondary | ICD-10-CM | POA: Diagnosis not present

## 2018-10-27 DIAGNOSIS — M25651 Stiffness of right hip, not elsewhere classified: Secondary | ICD-10-CM | POA: Diagnosis not present

## 2018-11-04 DIAGNOSIS — M6281 Muscle weakness (generalized): Secondary | ICD-10-CM | POA: Diagnosis not present

## 2018-11-04 DIAGNOSIS — M25651 Stiffness of right hip, not elsewhere classified: Secondary | ICD-10-CM | POA: Diagnosis not present

## 2018-11-04 DIAGNOSIS — Z96641 Presence of right artificial hip joint: Secondary | ICD-10-CM | POA: Diagnosis not present

## 2018-11-14 ENCOUNTER — Telehealth: Payer: Self-pay | Admitting: Internal Medicine

## 2018-11-14 NOTE — Telephone Encounter (Signed)
Called patient to schedule Prolia. She is going to follow up to make sure that [employer] are still available.  This will need to be sent to the pharmacy so that she can use her [employer] benefits and pick it up at the pharmacy to bring here.  Can this be sent please?

## 2018-11-17 ENCOUNTER — Other Ambulatory Visit: Payer: Self-pay

## 2018-11-17 MED ORDER — DENOSUMAB 60 MG/ML ~~LOC~~ SOSY: 60.0000 mg | PREFILLED_SYRINGE | Freq: Once | SUBCUTANEOUS | 0 refills | Status: AC

## 2018-11-17 NOTE — Telephone Encounter (Signed)
Patient would like prolia orders sent to  [place], [place] [phone] (Phone) [phone] (Fax)

## 2018-11-17 NOTE — Telephone Encounter (Signed)
prolia rx sent to [facility]

## 2018-11-18 MED ORDER — DENOSUMAB 60 MG/ML ~~LOC~~ SOSY: 60.0000 mg | PREFILLED_SYRINGE | Freq: Once | SUBCUTANEOUS | 0 refills | Status: AC

## 2018-11-18 NOTE — Addendum Note (Signed)
Addended by: Delice Bison E on: 11/18/2018 01:26 PM   Modules accepted: Orders

## 2018-11-18 NOTE — Telephone Encounter (Signed)
Rx sent 

## 2018-11-18 NOTE — Telephone Encounter (Signed)
Pt called in and stated that she would like the injection sent to [facility] in [place].   That is the only place she could find it

## 2018-11-20 ENCOUNTER — Telehealth: Payer: Self-pay

## 2018-11-20 NOTE — Telephone Encounter (Signed)
Copied from [place] [id number]. Topic: General - Other >> Nov 20, 2018 10:30 AM Pauline Good wrote: Reason for CRM: pt stated she is ready to schedule her prolia shot  lvm asking patient to call back, or I will try calling her back tomorrow----last injection was sent to patient's pharmacy, patient picked up injection and brought to me for administration (using [employer] [facility] patient assisted program)----ok to transfer to [place] office, patient needs to speak with Guinevere Stephenson,RN

## 2018-11-21 ENCOUNTER — Other Ambulatory Visit: Payer: Self-pay

## 2018-11-21 ENCOUNTER — Ambulatory Visit (INDEPENDENT_AMBULATORY_CARE_PROVIDER_SITE_OTHER): Payer: PPO

## 2018-11-21 DIAGNOSIS — Z23 Encounter for immunization: Secondary | ICD-10-CM

## 2018-11-21 DIAGNOSIS — M810 Age-related osteoporosis without current pathological fracture: Secondary | ICD-10-CM

## 2018-11-21 MED ORDER — DENOSUMAB 60 MG/ML ~~LOC~~ SOSY
60.0000 mg | PREFILLED_SYRINGE | Freq: Once | SUBCUTANEOUS | Status: AC
  Administered 2018-11-21: 17:00:00 60 mg via SUBCUTANEOUS

## 2018-11-21 NOTE — Addendum Note (Signed)
Addended by: Ander Slade on: 11/21/2018 04:52 PM   Modules accepted: Orders

## 2018-11-23 NOTE — Progress Notes (Signed)
prolia and flu injections given.   Binnie Rail, MD

## 2018-12-31 ENCOUNTER — Other Ambulatory Visit: Payer: Self-pay

## 2019-01-07 DIAGNOSIS — M1611 Unilateral primary osteoarthritis, right hip: Secondary | ICD-10-CM | POA: Diagnosis not present

## 2019-01-07 DIAGNOSIS — Z9641 Presence of insulin pump (external) (internal): Secondary | ICD-10-CM | POA: Diagnosis not present

## 2019-01-19 DIAGNOSIS — H16223 Keratoconjunctivitis sicca, not specified as Sjogren's, bilateral: Secondary | ICD-10-CM | POA: Diagnosis not present

## 2019-01-19 DIAGNOSIS — H40013 Open angle with borderline findings, low risk, bilateral: Secondary | ICD-10-CM | POA: Diagnosis not present

## 2019-01-19 DIAGNOSIS — H04123 Dry eye syndrome of bilateral lacrimal glands: Secondary | ICD-10-CM | POA: Diagnosis not present

## 2019-02-12 DIAGNOSIS — Z85828 Personal history of other malignant neoplasm of skin: Secondary | ICD-10-CM | POA: Diagnosis not present

## 2019-02-12 DIAGNOSIS — D225 Melanocytic nevi of trunk: Secondary | ICD-10-CM | POA: Diagnosis not present

## 2019-02-12 DIAGNOSIS — D1801 Hemangioma of skin and subcutaneous tissue: Secondary | ICD-10-CM | POA: Diagnosis not present

## 2019-02-12 DIAGNOSIS — L821 Other seborrheic keratosis: Secondary | ICD-10-CM | POA: Diagnosis not present

## 2019-02-12 DIAGNOSIS — L905 Scar conditions and fibrosis of skin: Secondary | ICD-10-CM | POA: Diagnosis not present

## 2019-02-12 DIAGNOSIS — L814 Other melanin hyperpigmentation: Secondary | ICD-10-CM | POA: Diagnosis not present

## 2019-02-16 DIAGNOSIS — Z96649 Presence of unspecified artificial hip joint: Secondary | ICD-10-CM | POA: Insufficient documentation

## 2019-02-16 NOTE — Patient Instructions (Addendum)
  Blood work was ordered.     Medications reviewed and updated.  Changes include :   none  Your prescription(s) have been submitted to your pharmacy. Please take as directed and contact our office if you believe you are having problem(s) with the medication(s).   Please followup in 6 months   

## 2019-02-16 NOTE — Progress Notes (Signed)
Subjective:    Patient ID: Susan Holland, female    DOB: 29-Jul-1945, 74 y.o.   MRN: [id number]  HPI The patient is here for follow up of their chronic medical problems, including hypertension, diabetes, hyperlipidemia, depression, constipation.  She is not exercising regularly.  She has gained weight from sitting around too much. She is not eating as well as she should.  She was not aware that she had diabetes.  Reviewed this with her and stressed to her that she is a diabetic.    She is taking all of her medications as prescribed.    She had her right hip replaced last September.  She has recovered well.  She has no pain.      Medications and allergies reviewed with patient and updated if appropriate.  Patient Active Problem List   Diagnosis Date Noted  . S/P total hip arthroplasty, 10/2018 02/16/2019  . Primary localized osteoarthritis of right hip 10/14/2018  . Groin pain, right 07/16/2018  . Osteoporosis 11/29/2017  . Diabetes mellitus without complication ([place]) 99991111  . Difficulty sleeping 05/21/2016  . Nonspecific abnormal electrocardiogram (ECG) (EKG) 05/18/2014  . Essential hypertension 05/17/2014  . Glaucoma suspect 05/04/2013  . Skin cancer 03/19/2011  . History of colonic polyps 03/17/2010  . HYPERLIPIDEMIA 03/06/2007    Current Outpatient Medications on File Prior to Visit  Medication Sig Dispense Refill  . acetaminophen (TYLENOL) 650 MG CR tablet Take 1,300 mg by mouth every 8 (eight) hours as needed for pain.    Marland Kitchen aspirin 81 MG chewable tablet Chew 1 tablet (81 mg total) by mouth 2 (two) times daily. 60 tablet 0  . atorvastatin (LIPITOR) 20 MG tablet Take 1 tablet (20 mg total) by mouth daily. (Patient taking differently: Take 20 mg by mouth every evening. ) 90 tablet 1  . Calcium Carbonate-Vitamin D (CALCIUM + D PO) Take 1 tablet by mouth 2 (two) times daily.     . Multiple Vitamin (MULTIVITAMIN WITH MINERALS) TABS tablet Take 1 tablet by mouth  daily.    . Omega-3 Fatty Acids (FISH OIL) 1200 MG CAPS Take 1,200 mg by mouth 2 (two) times daily.    Marland Kitchen telmisartan-hydrochlorothiazide (MICARDIS HCT) 80-12.5 MG tablet Take 1 tablet by mouth daily. 90 tablet 1   No current facility-administered medications on file prior to visit.    Past Medical History:  Diagnosis Date  . Colon polyp   . Diverticulosis   . Glaucoma suspect   . Hyperlipidemia   . Hypertension     Past Surgical History:  Procedure Laterality Date  . CATARACT EXTRACTION Bilateral 2016  . COLONOSCOPY W/ POLYPECTOMY  03/2011   repeat 2020; Dr Olevia Perches  . 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 TONSILLECTOMY AND ADENOIDECTOMY    . TOTAL HIP ARTHROPLASTY Right 10/14/2018   Procedure: RIght Anterior Hip Arthroplasty;  Surgeon: Melrose Nakayama, MD;  Location: [facility];  Service: Orthopedics;  Laterality: Right;  . TUBAL LIGATION      Social History   Socioeconomic History  . Marital status: Widowed    Spouse name: Not on file  . Number of children: 2  . Years of education: Not on file  . Highest education level: Not on file  Occupational History  . Occupation: retired  Tobacco Use  . Smoking status: Former Smoker    Types: Cigarettes    Quit date: 03/29/2015    Years since quitting: 3.8  .  Smokeless tobacco: Never Used  . Tobacco comment: smoked 1963- present, up to 1/2 ppd  Substance and Sexual Activity  . Alcohol use: Yes    Alcohol/week: 0.0 standard drinks    Comment:  occasionally   . Drug use: No  . Sexual activity: Never  Other Topics Concern  . Not on file  Social History Narrative   Exercise:  Does some walking, but irregularly   Social Determinants of Health   Financial Resource Strain:   . Difficulty of Paying Living Expenses: Not on file  Food Insecurity:   . Worried About Charity fundraiser in the Last Year: Not on file  . Ran Out of Food in the Last Year: Not on file  Transportation Needs:   . Lack of Transportation  (Medical): Not on file  . Lack of Transportation (Non-Medical): Not on file  Physical Activity:   . Days of Exercise per Week: Not on file  . Minutes of Exercise per Session: Not on file  Stress:   . Feeling of Stress : Not on file  Social Connections: Unknown  . Frequency of Communication with Friends and Family: Not on file  . Frequency of Social Gatherings with Friends and Family: More than three times a week  . Attends Religious Services: Not on file  . Active Member of Clubs or Organizations: Not on file  . Attends Archivist Meetings: Not on file  . Marital Status: Not on file    Family History  Problem Relation Age of Onset  . Dementia Mother        Alsheimer's  . Stroke Father 45  . Heart disease Sister        AF  . Colon cancer Maternal Uncle        X 2  . Diabetes Neg Hx     Review of Systems  Constitutional: Negative for chills and fever.  Respiratory: Negative for cough, shortness of breath and wheezing.   Cardiovascular: Negative for chest pain, palpitations and leg swelling.  Neurological: Negative for light-headedness, numbness and headaches.       Objective:   Vitals:   02/17/19 0835  BP: 124/78  Pulse: 68  Resp: 16  Temp: 98.4 F (36.9 C)  SpO2: 98%   BP Readings from Last 3 Encounters:  02/17/19 124/78  10/15/18 116/62  10/08/18 (!) 142/65   Wt Readings from Last 3 Encounters:  02/17/19 181 lb (82.1 kg)  10/14/18 174 lb 2 oz (79 kg)  10/08/18 174 lb 2 oz (79 kg)   Body mass index is 30.12 kg/m.   Physical Exam    Constitutional: Appears well-developed and well-nourished. No distress.  HENT:  Head: Normocephalic and atraumatic.  Neck: Neck supple. No tracheal deviation present. No thyromegaly present.  No cervical lymphadenopathy Cardiovascular: Normal rate, regular rhythm and normal heart sounds.   No murmur heard. No carotid bruit .  No edema Pulmonary/Chest: Effort normal and breath sounds normal. No respiratory  distress. No has no wheezes. No rales.  Skin: Skin is warm and dry. Not diaphoretic.  Psychiatric: Normal mood and affect. Behavior is normal.   Diabetic Foot Exam - Simple   Simple Foot Form Diabetic Foot exam was performed with the following findings: Yes 02/17/2019  9:14 AM  Visual Inspection No deformities, no ulcerations, no other skin breakdown bilaterally: Yes Sensation Testing Intact to touch and monofilament testing bilaterally: Yes Pulse Check Posterior Tibialis and Dorsalis pulse intact bilaterally: Yes Comments  Assessment & Plan:    See Problem List for Assessment and Plan of chronic medical problems.    This visit occurred during the SARS-CoV-2 public health emergency.  Safety protocols were in place, including screening questions prior to the visit, additional usage of staff PPE, and extensive cleaning of exam room while observing appropriate contact time as indicated for disinfecting solutions.

## 2019-02-17 ENCOUNTER — Encounter: Payer: Self-pay | Admitting: Internal Medicine

## 2019-02-17 ENCOUNTER — Ambulatory Visit (INDEPENDENT_AMBULATORY_CARE_PROVIDER_SITE_OTHER): Payer: PPO | Admitting: Internal Medicine

## 2019-02-17 ENCOUNTER — Other Ambulatory Visit: Payer: Self-pay

## 2019-02-17 VITALS — BP 124/78 | HR 68 | Temp 98.4°F | Resp 16 | Ht 65.0 in | Wt 181.0 lb

## 2019-02-17 DIAGNOSIS — E119 Type 2 diabetes mellitus without complications: Secondary | ICD-10-CM

## 2019-02-17 DIAGNOSIS — M816 Localized osteoporosis [Lequesne]: Secondary | ICD-10-CM | POA: Diagnosis not present

## 2019-02-17 DIAGNOSIS — E782 Mixed hyperlipidemia: Secondary | ICD-10-CM | POA: Diagnosis not present

## 2019-02-17 DIAGNOSIS — I10 Essential (primary) hypertension: Secondary | ICD-10-CM | POA: Diagnosis not present

## 2019-02-17 LAB — COMPREHENSIVE METABOLIC PANEL
ALT: 14 U/L (ref 0–35)
AST: 16 U/L (ref 0–37)
Albumin: 4.4 g/dL (ref 3.5–5.2)
Alkaline Phosphatase: 53 U/L (ref 39–117)
BUN: 12 mg/dL (ref 6–23)
CO2: 29 mEq/L (ref 19–32)
Calcium: 9.9 mg/dL (ref 8.4–10.5)
Chloride: 94 mEq/L — ABNORMAL LOW (ref 96–112)
Creatinine, Ser: 0.79 mg/dL (ref 0.40–1.20)
GFR: 71.17 mL/min (ref >60.00)
Glucose, Bld: 94 mg/dL (ref 70–99)
Potassium: 4.4 mEq/L (ref 3.5–5.1)
Sodium: 131 mEq/L — ABNORMAL LOW (ref 135–145)
Total Bilirubin: 0.7 mg/dL (ref 0.2–1.2)
Total Protein: 6.8 g/dL (ref 6.0–8.3)

## 2019-02-17 LAB — LIPID PANEL
Cholesterol: 137 mg/dL (ref 0–200)
HDL: 53.7 mg/dL (ref >39.00)
LDL Cholesterol: 63 mg/dL (ref 0–99)
NonHDL: 83.54
Total CHOL/HDL Ratio: 3
Triglycerides: 103 mg/dL (ref 0.0–149.0)
VLDL: 20.6 mg/dL (ref 0.0–40.0)

## 2019-02-17 LAB — HEMOGLOBIN A1C: Hgb A1c MFr Bld: 6.5 % (ref 4.6–6.5)

## 2019-02-17 MED ORDER — TELMISARTAN-HCTZ 80-12.5 MG PO TABS: 1.0000 | ORAL_TABLET | Freq: Every day | ORAL | 1 refills | Status: DC

## 2019-02-17 MED ORDER — ATORVASTATIN CALCIUM 20 MG PO TABS: 20.0000 mg | ORAL_TABLET | Freq: Every evening | ORAL | 1 refills | Status: DC

## 2019-02-17 NOTE — Assessment & Plan Note (Signed)
Chronic On prolia - last injection 11/2018 Taking calcium and vitamin d Stressed exercise

## 2019-02-17 NOTE — Assessment & Plan Note (Signed)
Chronic BP well controlled Current regimen effective and well tolerated Continue current medications at current doses cmp  

## 2019-02-17 NOTE — Assessment & Plan Note (Addendum)
Chronic Reviewed diagnosis Check a1c Low sugar / carb diet Stressed regular exercise Weight loss encouraged Will hold off on medication for now

## 2019-02-17 NOTE — Assessment & Plan Note (Signed)
Chronic Check lipid panel  Continue daily statin Regular exercise and healthy diet encouraged  

## 2019-04-22 DIAGNOSIS — Z1231 Encounter for screening mammogram for malignant neoplasm of breast: Secondary | ICD-10-CM | POA: Diagnosis not present

## 2019-04-22 LAB — HM MAMMOGRAPHY

## 2019-05-18 ENCOUNTER — Telehealth: Payer: Self-pay | Admitting: Internal Medicine

## 2019-05-18 NOTE — Telephone Encounter (Signed)
Patient will be due for Prolia on April 17th or after. She using [facility] so it will need to be sent to the pharmacy so that she can pick it up and bring it to the office but cannot be sent until the 17th.  Can this be sent in for her on Monday, 05/25/2019?  She prefers [facility] (but they did not have it last time so it was sent to [facility]).

## 2019-05-19 NOTE — Telephone Encounter (Signed)
Forwarding msg to [facility] who does Pharmacist, community for prolia...Susan Holland

## 2019-05-19 NOTE — Telephone Encounter (Signed)
Authorization has already been completed. Medication will need to be called in on Monday, April 19th.

## 2019-05-25 ENCOUNTER — Ambulatory Visit: Payer: PPO

## 2019-05-25 MED ORDER — DENOSUMAB 60 MG/ML ~~LOC~~ SOSY: 60.0000 mg | PREFILLED_SYRINGE | SUBCUTANEOUS | 0 refills | Status: DC

## 2019-05-25 NOTE — Telephone Encounter (Signed)
Rx sent 

## 2019-05-25 NOTE — Addendum Note (Signed)
Addended by: Delice Bison E on: 05/25/2019 08:49 AM   Modules accepted: Orders

## 2019-05-25 NOTE — Telephone Encounter (Signed)
Just a reminder to send in the Prolia prescription please :)

## 2019-06-30 MED ORDER — DENOSUMAB 60 MG/ML ~~LOC~~ SOSY: 60.0000 mg | PREFILLED_SYRINGE | SUBCUTANEOUS | 0 refills | Status: DC

## 2019-06-30 NOTE — Addendum Note (Signed)
Addended by: Delice Bison E on: 06/30/2019 04:18 PM   Modules accepted: Orders

## 2019-06-30 NOTE — Telephone Encounter (Signed)
Sent!

## 2019-06-30 NOTE — Telephone Encounter (Signed)
Her pharmacy is not able to get Prolia for her. Can it be sent to [facility] please?

## 2019-07-02 ENCOUNTER — Other Ambulatory Visit: Payer: Self-pay

## 2019-07-02 MED ORDER — ATORVASTATIN CALCIUM 20 MG PO TABS: 20.0000 mg | ORAL_TABLET | Freq: Every evening | ORAL | 0 refills | Status: DC

## 2019-07-02 MED ORDER — TELMISARTAN-HCTZ 80-12.5 MG PO TABS: 1.0000 | ORAL_TABLET | Freq: Every day | ORAL | 0 refills | Status: DC

## 2019-07-02 NOTE — Telephone Encounter (Signed)
   Patient states [facility] will have Prolia ready on 5/26 Advised patient to call back and reschedule if there is a delay at the pharmacy Appointment 5/26

## 2019-07-03 ENCOUNTER — Other Ambulatory Visit: Payer: Self-pay

## 2019-07-03 ENCOUNTER — Ambulatory Visit (INDEPENDENT_AMBULATORY_CARE_PROVIDER_SITE_OTHER): Payer: PPO | Admitting: *Deleted

## 2019-07-03 DIAGNOSIS — M810 Age-related osteoporosis without current pathological fracture: Secondary | ICD-10-CM | POA: Diagnosis not present

## 2019-07-03 MED ORDER — DENOSUMAB 60 MG/ML ~~LOC~~ SOSY
60.0000 mg | PREFILLED_SYRINGE | Freq: Once | SUBCUTANEOUS | Status: AC
  Administered 2019-07-03: 60 mg via SUBCUTANEOUS

## 2019-07-03 NOTE — Progress Notes (Addendum)
Pls cosign for prolia inj..Susan Holland    prolia Injection given.   Binnie Rail, MD

## 2019-07-08 DIAGNOSIS — H35033 Hypertensive retinopathy, bilateral: Secondary | ICD-10-CM | POA: Diagnosis not present

## 2019-07-08 DIAGNOSIS — H43813 Vitreous degeneration, bilateral: Secondary | ICD-10-CM | POA: Diagnosis not present

## 2019-07-08 DIAGNOSIS — H40013 Open angle with borderline findings, low risk, bilateral: Secondary | ICD-10-CM | POA: Diagnosis not present

## 2019-07-08 DIAGNOSIS — H35371 Puckering of macula, right eye: Secondary | ICD-10-CM | POA: Diagnosis not present

## 2019-07-08 LAB — HM DIABETES EYE EXAM

## 2019-07-13 DIAGNOSIS — D23122 Other benign neoplasm of skin of left lower eyelid, including canthus: Secondary | ICD-10-CM | POA: Diagnosis not present

## 2019-08-16 NOTE — Patient Instructions (Addendum)
Blood work was ordered.    All other Health Maintenance issues reviewed.   All recommended immunizations and age-appropriate screenings are up-to-date or discussed.  No immunization administered today.   Medications reviewed and updated.  Changes include :   none    Please followup in 6 months    Health Maintenance, Female Adopting a healthy lifestyle and getting preventive care are important in promoting health and wellness. Ask your health care provider about:  The right schedule for you to have regular tests and exams.  Things you can do on your own to prevent diseases and keep yourself healthy. What should I know about diet, weight, and exercise? Eat a healthy diet   Eat a diet that includes plenty of vegetables, fruits, low-fat dairy products, and lean protein.  Do not eat a lot of foods that are high in solid fats, added sugars, or sodium. Maintain a healthy weight Body mass index (BMI) is used to identify weight problems. It estimates body fat based on height and weight. Your health care provider can help determine your BMI and help you achieve or maintain a healthy weight. Get regular exercise Get regular exercise. This is one of the most important things you can do for your health. Most adults should:  Exercise for at least 150 minutes each week. The exercise should increase your heart rate and make you sweat (moderate-intensity exercise).  Do strengthening exercises at least twice a week. This is in addition to the moderate-intensity exercise.  Spend less time sitting. Even light physical activity can be beneficial. Watch cholesterol and blood lipids Have your blood tested for lipids and cholesterol at 74 years of age, then have this test every 5 years. Have your cholesterol levels checked more often if:  Your lipid or cholesterol levels are high.  You are older than 74 years of age.  You are at high risk for heart disease. What should I know about cancer  screening? Depending on your health history and family history, you may need to have cancer screening at various ages. This may include screening for:  Breast cancer.  Cervical cancer.  Colorectal cancer.  Skin cancer.  Lung cancer. What should I know about heart disease, diabetes, and high blood pressure? Blood pressure and heart disease  High blood pressure causes heart disease and increases the risk of stroke. This is more likely to develop in people who have high blood pressure readings, are of African descent, or are overweight.  Have your blood pressure checked: ? Every 3-5 years if you are 18-39 years of age. ? Every year if you are 40 years old or older. Diabetes Have regular diabetes screenings. This checks your fasting blood sugar level. Have the screening done:  Once every three years after age 40 if you are at a normal weight and have a low risk for diabetes.  More often and at a younger age if you are overweight or have a high risk for diabetes. What should I know about preventing infection? Hepatitis B If you have a higher risk for hepatitis B, you should be screened for this virus. Talk with your health care provider to find out if you are at risk for hepatitis B infection. Hepatitis C Testing is recommended for:  Everyone born from 1945 through 1965.  Anyone with known risk factors for hepatitis C. Sexually transmitted infections (STIs)  Get screened for STIs, including gonorrhea and chlamydia, if: ? You are sexually active and are younger than 24 years of   of age. ? You are older than 74 years of age and your health care provider tells you that you are at risk for this type of infection. ? Your sexual activity has changed since you were last screened, and you are at increased risk for chlamydia or gonorrhea. Ask your health care provider if you are at risk.  Ask your health care provider about whether you are at high risk for HIV. Your health care provider may  recommend a prescription medicine to help prevent HIV infection. If you choose to take medicine to prevent HIV, you should first get tested for HIV. You should then be tested every 3 months for as long as you are taking the medicine. Pregnancy  If you are about to stop having your period (premenopausal) and you may become pregnant, seek counseling before you get pregnant.  Take 400 to 800 micrograms (mcg) of folic acid every day if you become pregnant.  Ask for birth control (contraception) if you want to prevent pregnancy. Osteoporosis and menopause Osteoporosis is a disease in which the bones lose minerals and strength with aging. This can result in bone fractures. If you are 65 years old or older, or if you are at risk for osteoporosis and fractures, ask your health care provider if you should:  Be screened for bone loss.  Take a calcium or vitamin D supplement to lower your risk of fractures.  Be given hormone replacement therapy (HRT) to treat symptoms of menopause. Follow these instructions at home: Lifestyle  Do not use any products that contain nicotine or tobacco, such as cigarettes, e-cigarettes, and chewing tobacco. If you need help quitting, ask your health care provider.  Do not use street drugs.  Do not share needles.  Ask your health care provider for help if you need support or information about quitting drugs. Alcohol use  Do not drink alcohol if: ? Your health care provider tells you not to drink. ? You are pregnant, may be pregnant, or are planning to become pregnant.  If you drink alcohol: ? Limit how much you use to 0-1 drink a day. ? Limit intake if you are breastfeeding.  Be aware of how much alcohol is in your drink. In the [place] one drink equals one 12 oz bottle of beer (355 mL), one 5 oz glass of wine (148 mL), or one 1 oz glass of hard liquor (44 mL). General instructions  Schedule regular health, dental, and eye exams.  Stay current with your  vaccines.  Tell your health care provider if: ? You often feel depressed. ? You have ever been abused or do not feel safe at home. Summary  Adopting a healthy lifestyle and getting preventive care are important in promoting health and wellness.  Follow your health care provider's instructions about healthy diet, exercising, and getting tested or screened for diseases.  Follow your health care provider's instructions on monitoring your cholesterol and blood pressure. This information is not intended to replace advice given to you by your health care provider. Make sure you discuss any questions you have with your health care provider. Document Revised: 01/15/2018 Document Reviewed: 01/15/2018 Elsevier Patient Education  2020 Elsevier Inc.  

## 2019-08-16 NOTE — Progress Notes (Signed)
Subjective:    Patient ID: Susan Holland, female    DOB: 05/07/1945, 74 y.o.   MRN: [id number]  HPI She is here for a physical exam.   She has a rash on her arms, groin area and back.  It itches.  She changed detergents and just switched back.  She denies other changes.  She has taken benadryl and cortisone cream.  She thinks it is getting better.   She has not been exercising and has not wanted to do much.  She denies depression, but has no motivation.  Medications and allergies reviewed with patient and updated if appropriate.  Patient Active Problem List   Diagnosis Date Noted  . S/P total hip arthroplasty, 10/2018 02/16/2019  . Primary localized osteoarthritis of right hip 10/14/2018  . Groin pain, right 07/16/2018  . Osteoporosis 11/29/2017  . Diabetes mellitus without complication ([place]) 66/44/0347  . Difficulty sleeping 05/21/2016  . Nonspecific abnormal electrocardiogram (ECG) (EKG) 05/18/2014  . Essential hypertension 05/17/2014  . Glaucoma suspect 05/04/2013  . Skin cancer 03/19/2011  . History of colonic polyps 03/17/2010  . HYPERLIPIDEMIA 03/06/2007    Current Outpatient Medications on File Prior to Visit  Medication Sig Dispense Refill  . atorvastatin (LIPITOR) 20 MG tablet Take 1 tablet (20 mg total) by mouth every evening. 90 tablet 0  . Calcium Carbonate-Vitamin D (CALCIUM + D PO) Take 1 tablet by mouth 2 (two) times daily.     Marland Kitchen denosumab (PROLIA) 60 MG/ML SOSY injection Inject 60 mg into the skin every 6 (six) months. 60 mL 0  . losartan-hydrochlorothiazide (HYZAAR) 100-12.5 MG tablet Take by mouth.    . Multiple Vitamin (MULTIVITAMIN WITH MINERALS) TABS tablet Take 1 tablet by mouth daily.    . Omega-3 Fatty Acids (FISH OIL) 1200 MG CAPS Take 1,200 mg by mouth 2 (two) times daily.    Marland Kitchen telmisartan-hydrochlorothiazide (MICARDIS HCT) 80-12.5 MG tablet Take 1 tablet by mouth daily. 90 tablet 0   No current facility-administered medications on file prior to  visit.    Past Medical History:  Diagnosis Date  . Colon polyp   . Diverticulosis   . Glaucoma suspect   . Hyperlipidemia   . Hypertension     Past Surgical History:  Procedure Laterality Date  . CATARACT EXTRACTION Bilateral 2016  . COLONOSCOPY W/ POLYPECTOMY  03/2011   repeat 2020; Dr Olevia Perches  . 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 TONSILLECTOMY AND ADENOIDECTOMY    . TOTAL HIP ARTHROPLASTY Right 10/14/2018   Procedure: RIght Anterior Hip Arthroplasty;  Surgeon: Melrose Nakayama, MD;  Location: [facility];  Service: Orthopedics;  Laterality: Right;  . TUBAL LIGATION      Social History   Socioeconomic History  . Marital status: Widowed    Spouse name: Not on file  . Number of children: 2  . Years of education: Not on file  . Highest education level: Not on file  Occupational History  . Occupation: retired  Tobacco Use  . Smoking status: Former Smoker    Types: Cigarettes    Quit date: 03/29/2015    Years since quitting: 4.3  . Smokeless tobacco: Never Used  . Tobacco comment: smoked 1963- present, up to 1/2 ppd  Vaping Use  . Vaping Use: Never used  Substance and Sexual Activity  . Alcohol use: Yes    Alcohol/week: 0.0 standard drinks    Comment:  occasionally   .  Drug use: No  . Sexual activity: Never  Other Topics Concern  . Not on file  Social History Narrative   Exercise:  Does some walking, but irregularly   Social Determinants of Health   Financial Resource Strain:   . Difficulty of Paying Living Expenses:   Food Insecurity:   . Worried About Charity fundraiser in the Last Year:   . Arboriculturist in the Last Year:   Transportation Needs:   . Film/video editor (Medical):   Marland Kitchen Lack of Transportation (Non-Medical):   Physical Activity:   . Days of Exercise per Week:   . Minutes of Exercise per Session:   Stress:   . Feeling of Stress :   Social Connections:   . Frequency of Communication with Friends and Family:   .  Frequency of Social Gatherings with Friends and Family:   . Attends Religious Services:   . Active Member of Clubs or Organizations:   . Attends Archivist Meetings:   Marland Kitchen Marital Status:     Family History  Problem Relation Age of Onset  . Dementia Mother        Alsheimer's  . Stroke Father 66  . Heart disease Sister        AF  . Colon cancer Maternal Uncle        X 2  . Diabetes Neg Hx     Review of Systems  Constitutional: Negative for chills and fever.  Eyes: Negative for visual disturbance.  Respiratory: Negative for cough, shortness of breath and wheezing.   Cardiovascular: Negative for chest pain, palpitations and leg swelling.  Gastrointestinal: Positive for constipation. Negative for abdominal pain, blood in stool, diarrhea and nausea.       No gerd  Genitourinary: Negative for dysuria and hematuria.  Musculoskeletal: Positive for back pain (across lower back - with certain activities, no radiation). Negative for arthralgias.  Skin: Positive for rash.  Neurological: Negative for dizziness, light-headedness and headaches.  Psychiatric/Behavioral: Negative for dysphoric mood. The patient is not nervous/anxious.        Objective:   Vitals:   08/17/19 0912  BP: 118/70  Pulse: 73  Temp: 98 F (36.7 C)  SpO2: 99%   Filed Weights   08/17/19 0912  Weight: 190 lb (86.2 kg)   Body mass index is 31.62 kg/m.  BP Readings from Last 3 Encounters:  08/17/19 118/70  02/17/19 124/78  10/15/18 116/62    Wt Readings from Last 3 Encounters:  08/17/19 190 lb (86.2 kg)  02/17/19 181 lb (82.1 kg)  10/14/18 174 lb 2 oz (79 kg)     Physical Exam Constitutional: She appears well-developed and well-nourished. No distress.  HENT:  Head: Normocephalic and atraumatic.  Right Ear: External ear normal. Normal ear canal and TM Left Ear: External ear normal.  Normal ear canal and TM Mouth/Throat: Oropharynx is clear and moist.  Eyes: Conjunctivae and EOM are  normal.  Neck: Neck supple. No tracheal deviation present. No thyromegaly present.  No carotid bruit  Cardiovascular: Normal rate, regular rhythm and normal heart sounds.  No murmur heard.  No edema. Pulmonary/Chest: Effort normal and breath sounds normal. No respiratory distress. She has no wheezes. She has no rales.  Breast: deferred   Abdominal: Soft. She exhibits no distension. There is no tenderness.  Lymphadenopathy: She has no cervical adenopathy.  Skin: Skin is warm and dry. She is not diaphoretic.  Psychiatric: She has a normal mood and affect.  Her behavior is normal.        Assessment & Plan:   Physical exam: Screening blood work    ordered Immunizations  had Covid, others up to date Colonoscopy  Up to date  Mammogram  Up to date - Dr Marvel Plan Gyn  N/a - Dr Marvel Plan  Dexa  Up to date  Eye exams  Up to date  - Dr Herbert Deaner Exercise  None - stressed regular exercise Weight  Advised weight loss Substance abuse  None Sees derm every Jan.  See Problem List for Assessment and Plan of chronic medical problems.   This visit occurred during the SARS-CoV-2 public health emergency.  Safety protocols were in place, including screening questions prior to the visit, additional usage of staff PPE, and extensive cleaning of exam room while observing appropriate contact time as indicated for disinfecting solutions.

## 2019-08-17 ENCOUNTER — Ambulatory Visit (INDEPENDENT_AMBULATORY_CARE_PROVIDER_SITE_OTHER): Payer: PPO | Admitting: Internal Medicine

## 2019-08-17 ENCOUNTER — Other Ambulatory Visit: Payer: Self-pay

## 2019-08-17 ENCOUNTER — Encounter: Payer: Self-pay | Admitting: Internal Medicine

## 2019-08-17 VITALS — BP 118/70 | HR 73 | Temp 98.0°F | Ht 65.0 in | Wt 190.0 lb

## 2019-08-17 DIAGNOSIS — E782 Mixed hyperlipidemia: Secondary | ICD-10-CM

## 2019-08-17 DIAGNOSIS — I10 Essential (primary) hypertension: Secondary | ICD-10-CM

## 2019-08-17 DIAGNOSIS — Z0000 Encounter for general adult medical examination without abnormal findings: Secondary | ICD-10-CM

## 2019-08-17 DIAGNOSIS — M816 Localized osteoporosis [Lequesne]: Secondary | ICD-10-CM | POA: Diagnosis not present

## 2019-08-17 DIAGNOSIS — E119 Type 2 diabetes mellitus without complications: Secondary | ICD-10-CM

## 2019-08-17 MED ORDER — ATORVASTATIN CALCIUM 20 MG PO TABS: 20.0000 mg | ORAL_TABLET | Freq: Every evening | ORAL | 1 refills | Status: DC

## 2019-08-17 MED ORDER — LOSARTAN POTASSIUM-HCTZ 100-12.5 MG PO TABS: 1.0000 | ORAL_TABLET | Freq: Every day | ORAL | 1 refills | Status: DC

## 2019-08-17 NOTE — Assessment & Plan Note (Signed)
Chronic Check lipid panel  Continue daily statin Regular exercise and healthy diet encouraged  

## 2019-08-17 NOTE — Assessment & Plan Note (Signed)
Chronic Check a1c Not compliant with diabetic diet and not exercising Stressed Low sugar / carb diet  Stressed regular exercise

## 2019-08-17 NOTE — Assessment & Plan Note (Signed)
Chronic BP well controlled Current regimen effective and well tolerated Continue current medications at current doses cmp  

## 2019-08-17 NOTE — Assessment & Plan Note (Signed)
Chronic dexa up to date  Taking calcium and vitamin d Stressed exercise

## 2019-08-18 ENCOUNTER — Telehealth: Payer: Self-pay

## 2019-08-18 LAB — CBC WITH DIFFERENTIAL/PLATELET
Absolute Monocytes: 744 cells/uL (ref 200–950)
Basophils Absolute: 72 cells/uL (ref 0–200)
Basophils Relative: 0.9 %
Eosinophils Absolute: 120 cells/uL (ref 15–500)
Eosinophils Relative: 1.5 %
HCT: 41.1 % (ref 35.0–45.0)
Hemoglobin: 13.7 g/dL (ref 11.7–15.5)
Lymphs Abs: 2184 cells/uL (ref 850–3900)
MCH: 30.5 pg (ref 27.0–33.0)
MCHC: 33.3 g/dL (ref 32.0–36.0)
MCV: 91.5 fL (ref 80.0–100.0)
MPV: 11.6 fL (ref 7.5–12.5)
Monocytes Relative: 9.3 %
Neutro Abs: 4880 cells/uL (ref 1500–7800)
Neutrophils Relative %: 61 %
Platelets: 245 10*3/uL (ref 140–400)
RBC: 4.49 10*6/uL (ref 3.80–5.10)
RDW: 12.6 % (ref 11.0–15.0)
Total Lymphocyte: 27.3 %
WBC: 8 10*3/uL (ref 3.8–10.8)

## 2019-08-18 LAB — COMPREHENSIVE METABOLIC PANEL
AG Ratio: 1.7 (calc) (ref 1.0–2.5)
ALT: 19 U/L (ref 6–29)
AST: 19 U/L (ref 10–35)
Albumin: 4.5 g/dL (ref 3.6–5.1)
Alkaline phosphatase (APISO): 55 U/L (ref 37–153)
BUN: 13 mg/dL (ref 7–25)
CO2: 28 mmol/L (ref 20–32)
Calcium: 10.1 mg/dL (ref 8.6–10.4)
Chloride: 96 mmol/L — ABNORMAL LOW (ref 98–110)
Creat: 0.82 mg/dL (ref 0.60–0.93)
Globulin: 2.6 g/dL (calc) (ref 1.9–3.7)
Glucose, Bld: 102 mg/dL — ABNORMAL HIGH (ref 65–99)
Potassium: 4.3 mmol/L (ref 3.5–5.3)
Sodium: 133 mmol/L — ABNORMAL LOW (ref 135–146)
Total Bilirubin: 0.6 mg/dL (ref 0.2–1.2)
Total Protein: 7.1 g/dL (ref 6.1–8.1)

## 2019-08-18 LAB — LIPID PANEL
Cholesterol: 155 mg/dL (ref <200)
HDL: 50 mg/dL (ref >=50)
LDL Cholesterol (Calc): 82 mg/dL (calc)
Non-HDL Cholesterol (Calc): 105 mg/dL (calc) (ref <130)
Total CHOL/HDL Ratio: 3.1 (calc) (ref <5.0)
Triglycerides: 129 mg/dL (ref <150)

## 2019-08-18 LAB — HEMOGLOBIN A1C
Hgb A1c MFr Bld: 5.9 % of total Hgb — ABNORMAL HIGH (ref <5.7)
Mean Plasma Glucose: 123 (calc)
eAG (mmol/L): 6.8 (calc)

## 2019-08-18 LAB — TSH: TSH: 2.24 mIU/L (ref 0.40–4.50)

## 2019-08-18 NOTE — Telephone Encounter (Signed)
New message    Calling for test results  

## 2019-08-18 NOTE — Telephone Encounter (Signed)
See result note.  

## 2019-08-19 NOTE — Telephone Encounter (Signed)
New message:   Pt is returning a call for lab results and states she is unable to get into MyChart due to her computer being down. Please call the pt back on her home phone.

## 2019-08-19 NOTE — Telephone Encounter (Signed)
Spoke with patient today and results given. 

## 2019-08-19 NOTE — Telephone Encounter (Signed)
Blood counts, kidney function, liver tests, thyroid function normal. Cholesterol very good. A1c 5.9% - sugars well controlled.

## 2019-08-19 NOTE — Telephone Encounter (Signed)
Left message for patient to return call to clinic to discuss. Also told her she has a Occupational psychologist message as well with results if she wanted to log on and review.

## 2019-08-26 ENCOUNTER — Encounter: Payer: Self-pay | Admitting: Internal Medicine

## 2019-08-26 NOTE — Progress Notes (Signed)
Outside notes received. Information abstracted. Notes sent to scan.  

## 2019-08-31 ENCOUNTER — Telehealth: Payer: Self-pay

## 2019-08-31 NOTE — Telephone Encounter (Signed)
Spoke with patient today. 

## 2019-08-31 NOTE — Telephone Encounter (Signed)
1.Medication Requested:Telmisartan / HCTZ 80-12.5 mg done in 5/21   losartan-hydrochlorothiazide (HYZAAR) 100-12.5 MG tablet was this medication discontinue  - prescribe on 08/17/19.   2. Pharmacy (Name, [facility] ([place]) - [address], [place]  3. On Med List: Yes   4. Last Visit with PCP: 7.12.21   5. Next visit date with PCP: n/a   Agent: Please be advised that RX refills may take up to 3 business days. We ask that you follow-up with your pharmacy.

## 2019-08-31 NOTE — Telephone Encounter (Signed)
She was initially-this was then changed to telmisartan-hydrochlorothiazide when the supply was low of the previous 1.  At her recent visit we decided to keep her on the losartan-hydrochlorothiazide, which should have been filled earlier this month.  Therefore telmisartan-hydrochlorothiazide is no longer needed.

## 2019-09-01 ENCOUNTER — Telehealth: Payer: Self-pay | Admitting: Internal Medicine

## 2019-09-01 NOTE — Telephone Encounter (Signed)
Spoke with Curt Bears and info given.

## 2019-09-01 NOTE — Telephone Encounter (Signed)
New message:    Susan Holland is calling from [facility] to see if losartan-hydrochlorothiazide (HYZAAR) 100-12.5 MG tablet is replacing the Tolmesartan 80-12.5 that the pt used to be on. She states the Ref# [id number]. Please advise.

## 2019-12-10 ENCOUNTER — Other Ambulatory Visit: Payer: Self-pay

## 2019-12-10 ENCOUNTER — Telehealth: Payer: Self-pay | Admitting: Internal Medicine

## 2019-12-10 MED ORDER — DENOSUMAB 60 MG/ML ~~LOC~~ SOSY: 60.0000 mg | PREFILLED_SYRINGE | SUBCUTANEOUS | 0 refills | Status: DC

## 2019-12-10 NOTE — Telephone Encounter (Signed)
Sent in today for patient. 

## 2019-12-10 NOTE — Telephone Encounter (Signed)
Patient due for Prolia after the [place], [place] Phone:  [phone]  Fax:  [phone]     Patient bringing meds, would like to pick up on Sunday 11.28.021 for injection on Monday 11.29.21, patient scheduled at 3:20pm

## 2020-01-04 ENCOUNTER — Other Ambulatory Visit: Payer: Self-pay

## 2020-01-04 ENCOUNTER — Ambulatory Visit (INDEPENDENT_AMBULATORY_CARE_PROVIDER_SITE_OTHER): Payer: PPO

## 2020-01-04 DIAGNOSIS — M810 Age-related osteoporosis without current pathological fracture: Secondary | ICD-10-CM

## 2020-01-04 MED ORDER — DENOSUMAB 60 MG/ML ~~LOC~~ SOSY
60.0000 mg | PREFILLED_SYRINGE | Freq: Once | SUBCUTANEOUS | Status: AC
  Administered 2020-01-04: 60 mg via SUBCUTANEOUS

## 2020-01-04 NOTE — Progress Notes (Signed)
Prolia given and supplied by patient

## 2020-01-08 ENCOUNTER — Other Ambulatory Visit: Payer: Self-pay | Admitting: Internal Medicine

## 2020-02-08 ENCOUNTER — Encounter: Payer: Self-pay | Admitting: Internal Medicine

## 2020-02-08 NOTE — Patient Instructions (Signed)
  Blood work was ordered.     Medications changes include :   None   Work on weight loss - eat less and move more.    Please followup in 6 months

## 2020-02-08 NOTE — Progress Notes (Signed)
Subjective:    Patient ID: Susan Holland, female    DOB: 04-Oct-1945, 75 y.o.   MRN: [id number]  HPI The patient is here for follow up of their chronic medical problems, including htn, DM, hyperlipidemia, osteoporosis  She is not exercising regularly.     She is SOB and wheezing with activity.    Medications and allergies reviewed with patient and updated if appropriate.  Patient Active Problem List   Diagnosis Date Noted  . SOB (shortness of breath) on exertion 02/09/2020  . S/P total hip arthroplasty, 10/2018 02/16/2019  . Primary localized osteoarthritis of right hip 10/14/2018  . Groin pain, right 07/16/2018  . Osteoporosis 11/29/2017  . Diabetes mellitus without complication (HCC) 06/05/2017  . Difficulty sleeping 05/21/2016  . Nonspecific abnormal electrocardiogram (ECG) (EKG) 05/18/2014  . Essential hypertension 05/17/2014  . Glaucoma suspect 05/04/2013  . Skin cancer 03/19/2011  . History of colonic polyps 03/17/2010  . HYPERLIPIDEMIA 03/06/2007    Current Outpatient Medications on File Prior to Visit  Medication Sig Dispense Refill  . atorvastatin (LIPITOR) 20 MG tablet Take 1 tablet by mouth every evening 90 tablet 0  . Calcium Carbonate-Vitamin D (CALCIUM + D PO) Take 1 tablet by mouth 2 (two) times daily.     Marland Kitchen denosumab (PROLIA) 60 MG/ML SOSY injection Inject 60 mg into the skin every 6 (six) months. 60 mL 0  . losartan-hydrochlorothiazide (HYZAAR) 100-12.5 MG tablet Take 1 tablet by mouth daily 90 tablet 0  . Multiple Vitamin (MULTIVITAMIN WITH MINERALS) TABS tablet Take 1 tablet by mouth daily.    . Omega-3 Fatty Acids (FISH OIL) 1200 MG CAPS Take 1,200 mg by mouth 2 (two) times daily.    Marland Kitchen FLUAD QUADRIVALENT 0.5 ML injection      No current facility-administered medications on file prior to visit.    Past Medical History:  Diagnosis Date  . Colon polyp   . Diverticulosis   . Glaucoma suspect   . Hyperlipidemia   . Hypertension     Past Surgical  History:  Procedure Laterality Date  . CATARACT EXTRACTION Bilateral 2016  . COLONOSCOPY W/ POLYPECTOMY  03/2011   repeat 2020; Dr Juanda Chance  . 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 TONSILLECTOMY AND ADENOIDECTOMY    . TOTAL HIP ARTHROPLASTY Right 10/14/2018   Procedure: RIght Anterior Hip Arthroplasty;  Surgeon: Marcene Corning, MD;  Location: [facility];  Service: Orthopedics;  Laterality: Right;  . TUBAL LIGATION      Social History   Socioeconomic History  . Marital status: Widowed    Spouse name: Not on file  . Number of children: 2  . Years of education: Not on file  . Highest education level: Not on file  Occupational History  . Occupation: retired  Tobacco Use  . Smoking status: Former Smoker    Types: Cigarettes    Quit date: 03/29/2015    Years since quitting: 4.8  . Smokeless tobacco: Never Used  . Tobacco comment: smoked 1963- present, up to 1/2 ppd  Vaping Use  . Vaping Use: Never used  Substance and Sexual Activity  . Alcohol use: Yes    Alcohol/week: 0.0 standard drinks    Comment:  occasionally   . Drug use: No  . Sexual activity: Never  Other Topics Concern  . Not on file  Social History Narrative   Exercise:  Does some walking, but irregularly   Social Determinants  of Health   Financial Resource Strain: Not on file  Food Insecurity: Not on file  Transportation Needs: Not on file  Physical Activity: Not on file  Stress: Not on file  Social Connections: Not on file    Family History  Problem Relation Age of Onset  . Dementia Mother        Alsheimer's  . Stroke Father 78  . Heart disease Sister        AF  . Colon cancer Maternal Uncle        X 2  . Diabetes Neg Hx     Review of Systems  Constitutional: Negative for chills and fever.  Respiratory: Positive for shortness of breath (with activity only) and wheezing (with activity only). Negative for cough and chest tightness.   Cardiovascular: Negative for chest pain,  palpitations and leg swelling.  Neurological: Negative for light-headedness and headaches.       Objective:   Vitals:   02/09/20 1047  BP: 124/76  Pulse: 80  Temp: 98.1 F (36.7 C)  SpO2: 95%   BP Readings from Last 3 Encounters:  02/09/20 124/76  08/17/19 118/70  02/17/19 124/78   Wt Readings from Last 3 Encounters:  02/09/20 193 lb (87.5 kg)  08/17/19 190 lb (86.2 kg)  02/17/19 181 lb (82.1 kg)   Body mass index is 32.12 kg/m.   Physical Exam    Constitutional: Appears well-developed and well-nourished. No distress.  HENT:  Head: Normocephalic and atraumatic.  Neck: Neck supple. No tracheal deviation present. No thyromegaly present.  No cervical lymphadenopathy Cardiovascular: Normal rate, regular rhythm and normal heart sounds.   No murmur heard. No carotid bruit .  No edema Pulmonary/Chest: Effort normal and breath sounds normal. No respiratory distress. No has no wheezes. No rales.  Skin: Skin is warm and dry. Not diaphoretic.  Psychiatric: Normal mood and affect. Behavior is normal.      Assessment & Plan:    See Problem List for Assessment and Plan of chronic medical problems.    This visit occurred during the SARS-CoV-2 public health emergency.  Safety protocols were in place, including screening questions prior to the visit, additional usage of staff PPE, and extensive cleaning of exam room while observing appropriate contact time as indicated for disinfecting solutions.

## 2020-02-09 ENCOUNTER — Ambulatory Visit (INDEPENDENT_AMBULATORY_CARE_PROVIDER_SITE_OTHER): Payer: PPO | Admitting: Internal Medicine

## 2020-02-09 ENCOUNTER — Other Ambulatory Visit: Payer: Self-pay

## 2020-02-09 VITALS — BP 124/76 | HR 80 | Temp 98.1°F | Ht 65.0 in | Wt 193.0 lb

## 2020-02-09 DIAGNOSIS — I10 Essential (primary) hypertension: Secondary | ICD-10-CM | POA: Diagnosis not present

## 2020-02-09 DIAGNOSIS — R0602 Shortness of breath: Secondary | ICD-10-CM | POA: Insufficient documentation

## 2020-02-09 DIAGNOSIS — E119 Type 2 diabetes mellitus without complications: Secondary | ICD-10-CM | POA: Diagnosis not present

## 2020-02-09 DIAGNOSIS — M816 Localized osteoporosis [Lequesne]: Secondary | ICD-10-CM

## 2020-02-09 DIAGNOSIS — E782 Mixed hyperlipidemia: Secondary | ICD-10-CM

## 2020-02-09 LAB — COMPREHENSIVE METABOLIC PANEL
ALT: 19 U/L (ref 0–35)
AST: 22 U/L (ref 0–37)
Albumin: 4.6 g/dL (ref 3.5–5.2)
Alkaline Phosphatase: 50 U/L (ref 39–117)
BUN: 11 mg/dL (ref 6–23)
CO2: 31 mEq/L (ref 19–32)
Calcium: 10.3 mg/dL (ref 8.4–10.5)
Chloride: 96 mEq/L (ref 96–112)
Creatinine, Ser: 0.86 mg/dL (ref 0.40–1.20)
GFR: 66.35 mL/min (ref >60.00)
Glucose, Bld: 116 mg/dL — ABNORMAL HIGH (ref 70–99)
Potassium: 4.2 mEq/L (ref 3.5–5.1)
Sodium: 134 mEq/L — ABNORMAL LOW (ref 135–145)
Total Bilirubin: 0.6 mg/dL (ref 0.2–1.2)
Total Protein: 7.6 g/dL (ref 6.0–8.3)

## 2020-02-09 LAB — LIPID PANEL
Cholesterol: 149 mg/dL (ref 0–200)
HDL: 46 mg/dL (ref >39.00)
LDL Cholesterol: 85 mg/dL (ref 0–99)
NonHDL: 102.85
Total CHOL/HDL Ratio: 3
Triglycerides: 87 mg/dL (ref 0.0–149.0)
VLDL: 17.4 mg/dL (ref 0.0–40.0)

## 2020-02-09 LAB — HEMOGLOBIN A1C: Hgb A1c MFr Bld: 6.5 % (ref 4.6–6.5)

## 2020-02-09 NOTE — Assessment & Plan Note (Signed)
Chronic Check lipid panel  Continue atorvastatin 20 mg daily Regular exercise and healthy diet encouraged  

## 2020-02-09 NOTE — Assessment & Plan Note (Signed)
Acute - new problem Has gotten worse She feels it is related to deconditioning and weight gain, which are likely contributing Also has a hx of smoking so she may have some COPD Declined referral to pulm or inhaler at this time - her symptoms are not that bad She will work on weight loss and increasing her activity

## 2020-02-09 NOTE — Assessment & Plan Note (Signed)
Chronic °BP well controlled °Continue hyzaar 100-12.5 mg daily °cmp ° °

## 2020-02-09 NOTE — Assessment & Plan Note (Signed)
Chronic dexa due - ordered On prolia - has had 4 injections so far - tolerating it well Taking calcium and vitamin daily  Stressed regular exercise

## 2020-02-09 NOTE — Assessment & Plan Note (Signed)
Chronic Diet controlled Check a1c Low sugar / carb diet Stressed regular exercise Advised weight loss 

## 2020-02-11 ENCOUNTER — Telehealth: Payer: Self-pay | Admitting: Internal Medicine

## 2020-02-11 NOTE — Telephone Encounter (Signed)
If you could please call patient at [phone]. She does not carry her cell phone often

## 2020-02-11 NOTE — Telephone Encounter (Signed)
Spoke with patient today. 

## 2020-02-11 NOTE — Telephone Encounter (Signed)
Your cholesterol is good. Your kidney function and liver tests are normal.  Your a1c is 6.5% which is higher than 5 months ago. Your sugars are controlled.

## 2020-02-11 NOTE — Telephone Encounter (Signed)
Please call patient with lab results

## 2020-02-15 DIAGNOSIS — L720 Epidermal cyst: Secondary | ICD-10-CM | POA: Diagnosis not present

## 2020-02-15 DIAGNOSIS — L819 Disorder of pigmentation, unspecified: Secondary | ICD-10-CM | POA: Diagnosis not present

## 2020-02-15 DIAGNOSIS — L814 Other melanin hyperpigmentation: Secondary | ICD-10-CM | POA: Diagnosis not present

## 2020-02-15 DIAGNOSIS — L570 Actinic keratosis: Secondary | ICD-10-CM | POA: Diagnosis not present

## 2020-02-15 DIAGNOSIS — D485 Neoplasm of uncertain behavior of skin: Secondary | ICD-10-CM | POA: Diagnosis not present

## 2020-02-15 DIAGNOSIS — L821 Other seborrheic keratosis: Secondary | ICD-10-CM | POA: Diagnosis not present

## 2020-02-15 DIAGNOSIS — L905 Scar conditions and fibrosis of skin: Secondary | ICD-10-CM | POA: Diagnosis not present

## 2020-02-15 DIAGNOSIS — Z85828 Personal history of other malignant neoplasm of skin: Secondary | ICD-10-CM | POA: Diagnosis not present

## 2020-02-15 DIAGNOSIS — L989 Disorder of the skin and subcutaneous tissue, unspecified: Secondary | ICD-10-CM | POA: Diagnosis not present

## 2020-02-15 DIAGNOSIS — D229 Melanocytic nevi, unspecified: Secondary | ICD-10-CM | POA: Diagnosis not present

## 2020-02-29 ENCOUNTER — Other Ambulatory Visit: Payer: PPO

## 2020-03-09 DIAGNOSIS — H40013 Open angle with borderline findings, low risk, bilateral: Secondary | ICD-10-CM | POA: Diagnosis not present

## 2020-03-10 ENCOUNTER — Ambulatory Visit (INDEPENDENT_AMBULATORY_CARE_PROVIDER_SITE_OTHER)
Admission: RE | Admit: 2020-03-10 | Discharge: 2020-03-10 | Disposition: A | Discharge status: Home / Self Care | Payer: PPO | Source: Ambulatory Visit | Attending: Internal Medicine | Admitting: Internal Medicine

## 2020-03-10 ENCOUNTER — Other Ambulatory Visit: Payer: Self-pay

## 2020-03-10 DIAGNOSIS — M816 Localized osteoporosis [Lequesne]: Secondary | ICD-10-CM | POA: Diagnosis not present

## 2020-03-14 DIAGNOSIS — D0362 Melanoma in situ of left upper limb, including shoulder: Secondary | ICD-10-CM | POA: Diagnosis not present

## 2020-03-14 DIAGNOSIS — L905 Scar conditions and fibrosis of skin: Secondary | ICD-10-CM | POA: Diagnosis not present

## 2020-03-22 ENCOUNTER — Telehealth: Payer: Self-pay | Admitting: Internal Medicine

## 2020-03-22 NOTE — Telephone Encounter (Signed)
Patient is looking for some patient assistance programs through prolia.

## 2020-03-29 NOTE — Telephone Encounter (Signed)
Patient reaching back and would like to speak with you about her prolia

## 2020-05-04 DIAGNOSIS — Z1231 Encounter for screening mammogram for malignant neoplasm of breast: Secondary | ICD-10-CM | POA: Diagnosis not present

## 2020-06-08 ENCOUNTER — Telehealth: Payer: Self-pay | Admitting: Internal Medicine

## 2020-06-08 NOTE — Telephone Encounter (Signed)
Patient due for Prolia after 5.30.22 Benefits initiated 5.4.22 Confirm that patient is still bringing medication

## 2020-06-13 DIAGNOSIS — L905 Scar conditions and fibrosis of skin: Secondary | ICD-10-CM | POA: Diagnosis not present

## 2020-06-13 DIAGNOSIS — Z85820 Personal history of malignant melanoma of skin: Secondary | ICD-10-CM | POA: Diagnosis not present

## 2020-06-13 DIAGNOSIS — L720 Epidermal cyst: Secondary | ICD-10-CM | POA: Diagnosis not present

## 2020-06-13 DIAGNOSIS — L821 Other seborrheic keratosis: Secondary | ICD-10-CM | POA: Diagnosis not present

## 2020-06-13 DIAGNOSIS — L570 Actinic keratosis: Secondary | ICD-10-CM | POA: Diagnosis not present

## 2020-06-13 DIAGNOSIS — D229 Melanocytic nevi, unspecified: Secondary | ICD-10-CM | POA: Diagnosis not present

## 2020-06-13 DIAGNOSIS — L814 Other melanin hyperpigmentation: Secondary | ICD-10-CM | POA: Diagnosis not present

## 2020-08-01 ENCOUNTER — Encounter: Payer: Self-pay | Admitting: Internal Medicine

## 2020-08-01 ENCOUNTER — Ambulatory Visit (INDEPENDENT_AMBULATORY_CARE_PROVIDER_SITE_OTHER): Payer: PPO | Admitting: Internal Medicine

## 2020-08-01 ENCOUNTER — Other Ambulatory Visit: Payer: Self-pay

## 2020-08-01 VITALS — BP 132/78 | HR 80 | Temp 98.1°F | Ht 65.0 in | Wt 191.4 lb

## 2020-08-01 DIAGNOSIS — L568 Other specified acute skin changes due to ultraviolet radiation: Secondary | ICD-10-CM | POA: Diagnosis not present

## 2020-08-01 LAB — CBC WITH DIFFERENTIAL/PLATELET
Basophils Absolute: 0.1 10*3/uL (ref 0.0–0.1)
Basophils Relative: 0.7 % (ref 0.0–3.0)
Eosinophils Absolute: 0.1 10*3/uL (ref 0.0–0.7)
Eosinophils Relative: 1.7 % (ref 0.0–5.0)
HCT: 41.9 % (ref 36.0–46.0)
Hemoglobin: 14 g/dL (ref 12.0–15.0)
Lymphocytes Relative: 23.9 % (ref 12.0–46.0)
Lymphs Abs: 2 10*3/uL (ref 0.7–4.0)
MCHC: 33.4 g/dL (ref 30.0–36.0)
MCV: 90.7 fl (ref 78.0–100.0)
Monocytes Absolute: 0.7 10*3/uL (ref 0.1–1.0)
Monocytes Relative: 8.2 % (ref 3.0–12.0)
Neutro Abs: 5.4 10*3/uL (ref 1.4–7.7)
Neutrophils Relative %: 65.5 % (ref 43.0–77.0)
Platelets: 219 10*3/uL (ref 150.0–400.0)
RBC: 4.62 Mil/uL (ref 3.87–5.11)
RDW: 13.2 % (ref 11.5–15.5)
WBC: 8.3 10*3/uL (ref 4.0–10.5)

## 2020-08-01 MED ORDER — METHYLPREDNISOLONE ACETATE 80 MG/ML IJ SUSP
120.0000 mg | Freq: Once | INTRAMUSCULAR | Status: AC
  Administered 2020-08-01: 120 mg via INTRAMUSCULAR

## 2020-08-01 MED ORDER — FLUOCINONIDE EMULSIFIED BASE 0.05 % EX CREA: 1.0000 "application " | TOPICAL_CREAM | Freq: Two times a day (BID) | CUTANEOUS | 1 refills | Status: DC

## 2020-08-01 MED ORDER — HYDROXYZINE PAMOATE 25 MG PO CAPS: 25.0000 mg | ORAL_CAPSULE | Freq: Three times a day (TID) | ORAL | 1 refills | Status: DC | PRN

## 2020-08-01 NOTE — Patient Instructions (Signed)
Polymorphous Light Eruption Polymorphous light eruption is a skin reaction to rays of energy that are found in sunlight (ultraviolet radiation, or UV radiation). Tanning beds and sunlamps also use UV radiation. Polymorphous light eruption can cause itchy, red patches to develop on skin that is exposed to UV radiation. The patches usually appear immediately after or within a few hours of being in the sun or after using a tanning bed or sunlamp. They often go awayon their own within several days or weeks. What are the causes? The cause of this condition is not known. What increases the risk? You are more likely to develop this condition if: You are a woman. You have light-colored skin (light complexion). You live in a [place]. You have a family history of this condition. What are the signs or symptoms? Symptoms of this condition include: Small bumps on the skin. Itchy patches on the skin. Redness or scales on the skin. A feeling of burning on the skin. Swelling. This is rare. Blisters. These are rare. Chills, headache, nausea, and a general feeling of illness (malaise). These symptoms are rare. How is this diagnosed? This condition may be diagnosed based on: A physical exam. Your medical history. Phototests. These are tests to check your skin's reaction to a type of ultraviolet light (UVA light). How is this treated? Polymorphous light eruption may clear up without treatment. Treatment for this condition may include: Protecting your skin from the sun with clothing and sunscreen. Avoiding sunlight between the hours of 11 a.m. and 2 p.m. Applying medicines to the skin to reduce swelling (topical corticosteroids). Gradually spending more time in the sun to build resistance. Oral immunosuppressive therapy, in rare cases. These are medicines taken by mouth that reduce the activity of the body's disease-fighting (immune) system. Follow these instructions at home:  Take and apply  over-the-counter and prescription medicines only as told by your health care provider. Wear clothing to protect your skin from sunlight. This includes long sleeves and hats. Use broad-spectrum sunscreens that have an SPF of 30 or higher. Broad-spectrum means that the sunscreen protects your skin from both types of UV light (UVA and UVB). Check product labels to make sure that the sunscreen protects against UVA and UVB light. Follow instructions from your health care provider about being in the sun. You may need to: Avoid spending time in the sun between the hours of 11 a.m. and 2 p.m. UV radiation is strongest during those hours. Gradually spend more and more time in sunlight. Keep all follow-up visits as told by your health care provider. This is important. Contact a health care provider if: You have a fever. You have severe pain, and medicines do not help. Get help right away if: You have severe skin blistering, peeling, or bleeding. You have a very high fever or chills. You have a severe headache, you feel confused, or you faint. You have nausea or vomiting. You develop body aches and pains. Summary Polymorphous light eruption is a skin reaction to rays of energy found in sunlight (UV radiation). This condition can also be caused by UV radiation from tanning beds and sun lamps. You may have itchy, red patches on your skin that go away on their own. You may not need treatment. If you do need treatment, it usually includes protective clothing to shield your skin from the sun, broad-spectrum sunscreens with an SPF of 30 or higher, and medicines that you apply to your skin. This information is not intended to replace advice  given to you by your health care provider. Make sure you discuss any questions you have with your healthcare provider. Document Revised: 11/18/2019 Document Reviewed: 11/19/2019 Elsevier Patient Education  2022 [employer].

## 2020-08-01 NOTE — Progress Notes (Signed)
Subjective:  Patient ID: Susan Holland, female    DOB: [birth year]  Age: 75 y.o. MRN: [id number]  CC: Rash  This visit occurred during the SARS-CoV-2 public health emergency.  Safety protocols were in place, including screening questions prior to the visit, additional usage of staff PPE, and extensive cleaning of exam room while observing appropriate contact time as indicated for disinfecting solutions.    HPI Susan Holland presents for concerns about an itchy rash over her upper chest and extremities for 5 days. She has not gotten much sx relief with benadryl. There are are no new meds or exposures.  Outpatient Medications Prior to Visit  Medication Sig Dispense Refill   atorvastatin (LIPITOR) 20 MG tablet Take 1 tablet by mouth every evening 90 tablet 0   Calcium Carbonate-Vitamin D (CALCIUM + D PO) Take 1 tablet by mouth 2 (two) times daily.      denosumab (PROLIA) 60 MG/ML SOSY injection Inject 60 mg into the skin every 6 (six) months. 60 mL 0   FLUAD QUADRIVALENT 0.5 ML injection      losartan-hydrochlorothiazide (HYZAAR) 100-12.5 MG tablet Take 1 tablet by mouth daily 90 tablet 0   Multiple Vitamin (MULTIVITAMIN WITH MINERALS) TABS tablet Take 1 tablet by mouth daily.     Omega-3 Fatty Acids (FISH OIL) 1200 MG CAPS Take 1,200 mg by mouth 2 (two) times daily.     No facility-administered medications prior to visit.    ROS Review of Systems  Constitutional:  Negative for chills, fatigue and fever.  HENT: Negative.  Negative for facial swelling, sore throat and trouble swallowing.   Eyes: Negative.   Respiratory:  Negative for cough, chest tightness, shortness of breath and wheezing.   Cardiovascular:  Negative for chest pain, palpitations and leg swelling.  Gastrointestinal:  Negative for abdominal pain and diarrhea.  Endocrine: Negative.   Genitourinary:  Negative for difficulty urinating.  Musculoskeletal:  Negative for arthralgias and myalgias.  Skin:  Positive  for rash.  Neurological: Negative.  Negative for dizziness and weakness.  Hematological:  Negative for adenopathy. Does not bruise/bleed easily.  Psychiatric/Behavioral: Negative.     Objective:  BP 132/78 (BP Location: Right Arm, Patient Position: Sitting, Cuff Size: Large)   Pulse 80   Temp 98.1 F (36.7 C) (Oral)   Ht 5\' 5"  (1.651 m)   Wt 191 lb 6.4 oz (86.8 kg)   SpO2 96%   BMI 31.85 kg/m   BP Readings from Last 3 Encounters:  08/01/20 132/78  02/09/20 124/76  08/17/19 118/70    Wt Readings from Last 3 Encounters:  08/01/20 191 lb 6.4 oz (86.8 kg)  02/09/20 193 lb (87.5 kg)  08/17/19 190 lb (86.2 kg)    Physical Exam Vitals reviewed.  Constitutional:      Appearance: Normal appearance.  HENT:     Mouth/Throat:     Mouth: Mucous membranes are moist.  Eyes:     Conjunctiva/sclera: Conjunctivae normal.  Cardiovascular:     Rate and Rhythm: Normal rate and regular rhythm.     Heart sounds: No murmur heard. Pulmonary:     Effort: Pulmonary effort is normal.     Breath sounds: No stridor. No wheezing, rhonchi or rales.  Abdominal:     General: Abdomen is flat.     Palpations: There is no mass.     Tenderness: There is no abdominal tenderness. There is no guarding.  Musculoskeletal:        General:  Normal range of motion.     Cervical back: Neck supple.     Right lower leg: No edema.     Left lower leg: No edema.  Lymphadenopathy:     Cervical: No cervical adenopathy.  Skin:    Coloration: Skin is not jaundiced.     Findings: Rash present. Rash is macular, papular and vesicular. Rash is not crusting, nodular, purpuric, pustular, scaling or urticarial.     Comments: There are papulovesicular eruptions over the chest and extremities. The largest is over the right shoulder. See photos. The skinned is tanned.   Neurological:     Mental Status: She is alert.  Psychiatric:        Mood and Affect: Mood normal.        Behavior: Behavior normal.    Lab Results   Component Value Date   WBC 8.3 08/01/2020   HGB 14.0 08/01/2020   HCT 41.9 08/01/2020   PLT 219.0 08/01/2020   GLUCOSE 116 (H) 02/09/2020   CHOL 149 02/09/2020   TRIG 87.0 02/09/2020   HDL 46.00 02/09/2020   LDLDIRECT 159.4 03/19/2011   LDLCALC 85 02/09/2020   ALT 19 02/09/2020   AST 22 02/09/2020   NA 134 (L) 02/09/2020   K 4.2 02/09/2020   CL 96 02/09/2020   CREATININE 0.86 02/09/2020   BUN 11 02/09/2020   CO2 31 02/09/2020   TSH 2.24 08/17/2019   INR 1.0 10/08/2018   HGBA1C 6.5 02/09/2020    DG Bone Density  Result Date: 03/10/2020 Date of study: 03/10/2020 Exam: DUAL X-RAY ABSORPTIOMETRY (DXA) FOR BONE MINERAL DENSITY (BMD) Instrument: [employer] Requesting Provider: PCP Indication: follow up for low BMD Comparison: 2019 Clinical data: Pt is a 75 y.o. female without previous history of fracture. Results:  Lumbar spine L1-L4 Femoral neck (FN) T-score -1.6 LFN: -1.4  Change in BMD from previous DXA test (%) Up 14.5%* Up 3.0% (*) statistically significant Assessment: By the [facility] Criteria for diagnosis based on bone density, this patient has Low Bone Density Z Score compares the patients bone density to age, sex, and race matched controls.  Compared to age, sex, and race matched controls, this patient's bone density is Average  FRAX 10-year fracture risk calculator: Not calculated as the pt is already on Denousumab Comments: the technical quality of the study is good. WHO criteria for diagnosis of osteoporosis in postmenopausal women and in men 27 y/o or older: - normal: T-score -1.0 to + 1.0 - osteopenia/low bone density: T-score between -2.5 and -1.0 - osteoporosis: T-score below -2.5 - severe osteoporosis: T-score below -2.5 with history of fragility fracture Note: although not part of the WHO classification, the presence of a fragility fracture, regardless of the T-score, should be considered diagnostic of osteoporosis, provided other causes for the fracture have been excluded.  Interpreted by : Mack Guise, MD [facility]    Assessment & Plan:   Susan Holland was seen today for rash.  Diagnoses and all orders for this visit:  Photodermatitis or photosensitivity- Will screen for alpha-gal. Will treat with topical and systemic steroids and systemic antihistamines. She will avoid the sun and use sunscreen. -     hydrOXYzine (VISTARIL) 25 MG capsule; Take 1 capsule (25 mg total) by mouth every 8 (eight) hours as needed. -     CBC with Differential/Platelet; Future -     Alpha-Gal Panel; Future -     methylPREDNISolone acetate (DEPO-MEDROL) injection 120 mg -     fluocinonide-emollient (  LIDEX-E) 0.05 % cream; Apply 1 application topically 2 (two) times daily. -     Alpha-Gal Panel -     CBC with Differential/Platelet  I am having Susan Holland start on hydrOXYzine and fluocinonide-emollient. I am also having her maintain her Calcium Carbonate-Vitamin D (CALCIUM + D PO), multivitamin with minerals, Fish Oil, denosumab, losartan-hydrochlorothiazide, atorvastatin, and Fluad Quadrivalent. We administered methylPREDNISolone acetate.  Meds ordered this encounter  Medications   hydrOXYzine (VISTARIL) 25 MG capsule    Sig: Take 1 capsule (25 mg total) by mouth every 8 (eight) hours as needed.    Dispense:  60 capsule    Refill:  1   methylPREDNISolone acetate (DEPO-MEDROL) injection 120 mg   fluocinonide-emollient (LIDEX-E) 0.05 % cream    Sig: Apply 1 application topically 2 (two) times daily.    Dispense:  60 g    Refill:  1      Follow-up: Return in about 3 weeks (around 08/22/2020).  Scarlette Calico, MD

## 2020-08-05 LAB — ALPHA-GAL PANEL
Beef IgE: <0.1 kU/L (ref <0.35)
Class: 0
Class: 0
Class: 0
Galactose-alpha-1,3-galactose IgE: <0.1 kU/L (ref <0.10)
LAMB/MUTTON IGE: <0.1 kU/L (ref <0.35)
Pork IgE: <0.1 kU/L (ref <0.35)

## 2020-08-11 NOTE — Patient Instructions (Addendum)
    Blood work was ordered.      Medications changes include :   none     Please followup in 6 months  

## 2020-08-11 NOTE — Progress Notes (Signed)
Subjective:    Patient ID: Susan Holland, female    DOB: 06-10-1945, 75 y.o.   MRN: [id number]  HPI The patient is here for follow up of their chronic medical problems, including htn, DM, hld, OP  She is walking around her house.  No regular exercise.  Not eating like she should.  Due for prolia   Medications and allergies reviewed with patient and updated if appropriate.  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   SOB (shortness of breath) on exertion 02/09/2020   S/P total hip arthroplasty, 10/2018 02/16/2019   Primary localized osteoarthritis of right hip 10/14/2018   Groin pain, right 07/16/2018   Osteoporosis 11/29/2017   Diabetes mellitus without complication ([place]) 17/51/0258   Difficulty sleeping 05/21/2016   Nonspecific abnormal electrocardiogram (ECG) (EKG) 05/18/2014   Essential hypertension 05/17/2014   Glaucoma suspect 05/04/2013   Skin cancer 03/19/2011   History of colonic polyps 03/17/2010   HYPERLIPIDEMIA 03/06/2007    Current Outpatient Medications on File Prior to Visit  Medication Sig Dispense Refill   amoxicillin (AMOXIL) 500 MG capsule amoxicillin 500 mg capsule  TAKE 4 CAPSULES BY MOUTH 1 HOUR PRIOR TO APPOINTMENT     Calcium Carbonate-Vitamin D (CALCIUM + D PO) Take 1 tablet by mouth 2 (two) times daily.      fluocinonide-emollient (LIDEX-E) 0.05 % cream Apply 1 application topically 2 (two) times daily. 60 g 1   hydrOXYzine (VISTARIL) 25 MG capsule Take 1 capsule (25 mg total) by mouth every 8 (eight) hours as needed. 60 capsule 1   Multiple Vitamin (MULTIVITAMIN WITH MINERALS) TABS tablet Take 1 tablet by mouth daily.     neomycin-polymyxin-dexameth (MAXITROL) 0.1 % OINT neomycin 3.5 mg/g-polymyxin B 10,000 unit/g-dexameth 0.1 % eye oint  APPLY A SMALL AMOUNT TO LEFT LOWER LID 3 TIMES A DAY AND AT BEDTIME FOR 2 WEEKS     Omega-3 Fatty Acids (FISH OIL) 1200 MG CAPS Take 1,200 mg by mouth  2 (two) times daily.     denosumab (PROLIA) 60 MG/ML SOSY injection Inject 60 mg into the skin every 6 (six) months. (Patient not taking: Reported on 08/12/2020) 60 mL 0   No current facility-administered medications on file prior to visit.    Past Medical History:  Diagnosis Date   Colon polyp    Diverticulosis    Glaucoma suspect    Hyperlipidemia    Hypertension     Past Surgical History:  Procedure Laterality Date   CATARACT EXTRACTION Bilateral 2016   COLONOSCOPY W/ POLYPECTOMY  03/2011   repeat 2020; Dr Olevia Perches   DEEP LEG / ANKLE TUMOR EXCISION      X 2 RLE ; benign   TONSILLECTOMY AND ADENOIDECTOMY     TOTAL HIP ARTHROPLASTY Right 10/14/2018   Procedure: RIght Anterior Hip Arthroplasty;  Surgeon: Melrose Nakayama, MD;  Location: [facility];  Service: Orthopedics;  Laterality: Right;   TUBAL LIGATION      Social History   Socioeconomic History   Marital status: Widowed    Spouse name: Not on file   Number of children: 2   Years of education: Not on file   Highest education level: Not on file  Occupational History   Occupation: retired  Tobacco Use   Smoking status: Former    Pack years: 0.00    Types: Cigarettes    Quit date: 03/29/2015    Years since quitting: 5.3   Smokeless tobacco: Never  Tobacco comments:    smoked 1963- present, up to 1/2 ppd  Vaping Use   Vaping Use: Never used  Substance and Sexual Activity   Alcohol use: Yes    Alcohol/week: 0.0 standard drinks    Comment:  occasionally    Drug use: No   Sexual activity: Never  Other Topics Concern   Not on file  Social History Narrative   Exercise:  Does some walking, but irregularly   Social Determinants of Health   Financial Resource Strain: Not on file  Food Insecurity: Not on file  Transportation Needs: Not on file  Physical Activity: Not on file  Stress: Not on file  Social Connections: Not on file    Family History  Problem Relation Age of Onset   Dementia Mother        Alsheimer's    Stroke Father 34   Heart disease Sister        AF   Colon cancer Maternal Uncle        X 2   Diabetes Neg Hx     Review of Systems  Constitutional:  Negative for chills and fever.  Respiratory:  Positive for shortness of breath (realted to weight). Negative for cough and wheezing.   Cardiovascular:  Negative for chest pain, palpitations and leg swelling.  Neurological:  Negative for light-headedness and headaches.      Objective:   Vitals:   08/12/20 0930  BP: 120/78  Pulse: 75  Temp: 98.1 F (36.7 C)  SpO2: 97%   BP Readings from Last 3 Encounters:  08/12/20 120/78  08/01/20 132/78  02/09/20 124/76   Wt Readings from Last 3 Encounters:  08/12/20 191 lb (86.6 kg)  08/01/20 191 lb 6.4 oz (86.8 kg)  02/09/20 193 lb (87.5 kg)   Body mass index is 31.78 kg/m.   Physical Exam    Constitutional: Appears well-developed and well-nourished. No distress.  HENT:  Head: Normocephalic and atraumatic.  Neck: Neck supple. No tracheal deviation present. No thyromegaly present.  No cervical lymphadenopathy Cardiovascular: Normal rate, regular rhythm and normal heart sounds.   No murmur heard. No carotid bruit .  No edema Pulmonary/Chest: Effort normal and breath sounds normal. No respiratory distress. No has no wheezes. No rales.  Skin: Skin is warm and dry. Not diaphoretic.  Psychiatric: Normal mood and affect. Behavior is normal.      Assessment & Plan:    See Problem List for Assessment and Plan of chronic medical problems.    This visit occurred during the SARS-CoV-2 public health emergency.  Safety protocols were in place, including screening questions prior to the visit, additional usage of staff PPE, and extensive cleaning of exam room while observing appropriate contact time as indicated for disinfecting solutions.

## 2020-08-12 ENCOUNTER — Encounter: Payer: Self-pay | Admitting: Internal Medicine

## 2020-08-12 ENCOUNTER — Ambulatory Visit (INDEPENDENT_AMBULATORY_CARE_PROVIDER_SITE_OTHER): Payer: PPO | Admitting: Internal Medicine

## 2020-08-12 ENCOUNTER — Other Ambulatory Visit: Payer: Self-pay

## 2020-08-12 VITALS — BP 120/78 | HR 75 | Temp 98.1°F | Ht 65.0 in | Wt 191.0 lb

## 2020-08-12 DIAGNOSIS — I10 Essential (primary) hypertension: Secondary | ICD-10-CM

## 2020-08-12 DIAGNOSIS — E782 Mixed hyperlipidemia: Secondary | ICD-10-CM | POA: Diagnosis not present

## 2020-08-12 DIAGNOSIS — M816 Localized osteoporosis [Lequesne]: Secondary | ICD-10-CM | POA: Diagnosis not present

## 2020-08-12 DIAGNOSIS — E119 Type 2 diabetes mellitus without complications: Secondary | ICD-10-CM | POA: Diagnosis not present

## 2020-08-12 LAB — COMPREHENSIVE METABOLIC PANEL
ALT: 17 U/L (ref 0–35)
AST: 18 U/L (ref 0–37)
Albumin: 4.6 g/dL (ref 3.5–5.2)
Alkaline Phosphatase: 53 U/L (ref 39–117)
BUN: 14 mg/dL (ref 6–23)
CO2: 28 mEq/L (ref 19–32)
Calcium: 10.6 mg/dL — ABNORMAL HIGH (ref 8.4–10.5)
Chloride: 95 mEq/L — ABNORMAL LOW (ref 96–112)
Creatinine, Ser: 0.81 mg/dL (ref 0.40–1.20)
GFR: 71.04 mL/min (ref >60.00)
Glucose, Bld: 113 mg/dL — ABNORMAL HIGH (ref 70–99)
Potassium: 3.7 mEq/L (ref 3.5–5.1)
Sodium: 134 mEq/L — ABNORMAL LOW (ref 135–145)
Total Bilirubin: 0.7 mg/dL (ref 0.2–1.2)
Total Protein: 7.6 g/dL (ref 6.0–8.3)

## 2020-08-12 LAB — HEMOGLOBIN A1C: Hgb A1c MFr Bld: 6.6 % — ABNORMAL HIGH (ref 4.6–6.5)

## 2020-08-12 LAB — LIPID PANEL
Cholesterol: 156 mg/dL (ref 0–200)
HDL: 50.3 mg/dL (ref >39.00)
LDL Cholesterol: 86 mg/dL (ref 0–99)
NonHDL: 105.71
Total CHOL/HDL Ratio: 3
Triglycerides: 101 mg/dL (ref 0.0–149.0)
VLDL: 20.2 mg/dL (ref 0.0–40.0)

## 2020-08-12 LAB — VITAMIN D 25 HYDROXY (VIT D DEFICIENCY, FRACTURES): VITD: 55.63 ng/mL (ref 30.00–100.00)

## 2020-08-12 MED ORDER — ATORVASTATIN CALCIUM 20 MG PO TABS: 20.0000 mg | ORAL_TABLET | Freq: Every evening | ORAL | 1 refills | Status: DC

## 2020-08-12 MED ORDER — LOSARTAN POTASSIUM-HCTZ 100-12.5 MG PO TABS: 1.0000 | ORAL_TABLET | Freq: Every day | ORAL | 1 refills | Status: DC

## 2020-08-12 NOTE — Assessment & Plan Note (Signed)
Chronic Check lipid panel  Continue atorvastatin 20 mg qd Regular exercise and healthy diet encouraged

## 2020-08-12 NOTE — Assessment & Plan Note (Addendum)
Chronic On prolia Q 6 months Due to prolia Will check on copay - looks she had is approved Stressed regular exercise Continue calcium and vitamin d daily Check vitamin d level

## 2020-08-12 NOTE — Assessment & Plan Note (Signed)
Chronic Controlled with diet Check a1c Stressed regular exercise Encouraged weight loss Diabetic diet Discussed starting rybelsus - she deferred

## 2020-08-12 NOTE — Assessment & Plan Note (Signed)
Chronic BP well controlled Continue hyzaar 100-12.5 mg qd cmp

## 2020-09-26 DIAGNOSIS — L298 Other pruritus: Secondary | ICD-10-CM | POA: Diagnosis not present

## 2020-09-26 DIAGNOSIS — L249 Irritant contact dermatitis, unspecified cause: Secondary | ICD-10-CM | POA: Diagnosis not present

## 2020-10-17 ENCOUNTER — Encounter: Payer: Self-pay | Admitting: Internal Medicine

## 2020-10-17 ENCOUNTER — Other Ambulatory Visit: Payer: Self-pay

## 2020-10-17 ENCOUNTER — Ambulatory Visit (INDEPENDENT_AMBULATORY_CARE_PROVIDER_SITE_OTHER): Payer: PPO

## 2020-10-17 VITALS — BP 120/82 | HR 61 | Temp 97.3°F | Ht 65.0 in | Wt 189.0 lb

## 2020-10-17 DIAGNOSIS — Z23 Encounter for immunization: Secondary | ICD-10-CM

## 2020-10-17 DIAGNOSIS — Z0000 Encounter for general adult medical examination without abnormal findings: Secondary | ICD-10-CM | POA: Diagnosis not present

## 2020-10-17 NOTE — Progress Notes (Signed)
Subjective:   Susan Holland is a 75 y.o. female who presents for Medicare Annual (Subsequent) preventive examination.  Review of Systems     Cardiac Risk Factors include: advanced age (>61mn, >>26women);diabetes mellitus;dyslipidemia;hypertension;family history of premature cardiovascular disease;obesity (BMI >30kg/m2)     Objective:    Today's Vitals   10/17/20 1131  BP: 120/82  Pulse: 61  Temp: (!) 97.3 F (36.3 C)  SpO2: 98%  Weight: 189 lb (85.7 kg)  Height: '5\' 5"'$  (1.651 m)  PainSc: 0-No pain   Body mass index is 31.45 kg/m.  Advanced Directives 10/17/2020 10/14/2018 10/08/2018 08/15/2018 06/05/2017  Does Patient Have a Medical Advance Directive? Yes No No Yes Yes  Type of Advance Directive Living will;Healthcare Power of [place] will [place] will  Does patient want to make changes to medical advance directive? No - Patient declined - - - -  Copy of [place] Chart? No - copy requested - - No - copy requested -  Would patient like information on creating a medical advance directive? - No - Patient declined No - Patient declined - -    Current Medications (verified) Outpatient Encounter Medications as of 10/17/2020  Medication Sig   amoxicillin (AMOXIL) 500 MG capsule amoxicillin 500 mg capsule  TAKE 4 CAPSULES BY MOUTH 1 HOUR PRIOR TO APPOINTMENT   atorvastatin (LIPITOR) 20 MG tablet Take 1 tablet (20 mg total) by mouth every evening.   Calcium Carbonate-Vitamin D (CALCIUM + D PO) Take 1 tablet by mouth 2 (two) times daily.    fluocinonide-emollient (LIDEX-E) 0.05 % cream Apply 1 application topically 2 (two) times daily.   losartan-hydrochlorothiazide (HYZAAR) 100-12.5 MG tablet Take 1 tablet by mouth daily.   Multiple Vitamin (MULTIVITAMIN WITH MINERALS) TABS tablet Take 1 tablet by mouth daily.   neomycin-polymyxin-dexameth (MAXITROL) 0.1 % OINT neomycin 3.5 mg/g-polymyxin B  10,000 unit/g-dexameth 0.1 % eye oint  APPLY A SMALL AMOUNT TO LEFT LOWER LID 3 TIMES A DAY AND AT BEDTIME FOR 2 WEEKS   Omega-3 Fatty Acids (FISH OIL) 1200 MG CAPS Take 1,200 mg by mouth 2 (two) times daily.   denosumab (PROLIA) 60 MG/ML SOSY injection Inject 60 mg into the skin every 6 (six) months. (Patient not taking: Reported on 08/12/2020)   hydrOXYzine (VISTARIL) 25 MG capsule Take 1 capsule (25 mg total) by mouth every 8 (eight) hours as needed. (Patient not taking: Reported on 10/17/2020)   No facility-administered encounter medications on file as of 10/17/2020.    Allergies (verified) Patient has no known allergies.   History: Past Medical History:  Diagnosis Date   Colon polyp    Diverticulosis    Glaucoma suspect    Hyperlipidemia    Hypertension    Past Surgical History:  Procedure Laterality Date   CATARACT EXTRACTION Bilateral 2016   COLONOSCOPY W/ POLYPECTOMY  03/2011   repeat 2020; Dr BOlevia Perches  DEEP LEG / ANKLE TUMOR EXCISION      X 2 RLE ; benign   TONSILLECTOMY AND ADENOIDECTOMY     TOTAL HIP ARTHROPLASTY Right 10/14/2018   Procedure: RIght Anterior Hip Arthroplasty;  Surgeon: DMelrose Nakayama MD;  Location: [facility];  Service: Orthopedics;  Laterality: Right;   TUBAL LIGATION     Family History  Problem Relation Age of Onset   Dementia Mother        Alsheimer's   Stroke Father 332  Heart disease Sister  AF   Colon cancer Maternal Uncle        X 2   Diabetes Neg Hx    Social History   Socioeconomic History   Marital status: Widowed    Spouse name: Not on file   Number of children: 2   Years of education: Not on file   Highest education level: Not on file  Occupational History   Occupation: retired  Tobacco Use   Smoking status: Former    Types: Cigarettes    Quit date: 03/29/2015    Years since quitting: 5.5   Smokeless tobacco: Never   Tobacco comments:    smoked 1963- present, up to 1/2 ppd  Vaping Use   Vaping Use: Never used   Substance and Sexual Activity   Alcohol use: Yes    Alcohol/week: 0.0 standard drinks    Comment:  occasionally    Drug use: No   Sexual activity: Never  Other Topics Concern   Not on file  Social History Narrative   Exercise:  Does some walking, but irregularly   Social Determinants of Health   Financial Resource Strain: Low Risk    Difficulty of Paying Living Expenses: Not hard at all  Food Insecurity: No Food Insecurity   Worried About Charity fundraiser in the Last Year: Never true   Ran Out of Food in the Last Year: Never true  Transportation Needs: No Transportation Needs   Lack of Transportation (Medical): No   Lack of Transportation (Non-Medical): No  Physical Activity: Inactive   Days of Exercise per Week: 0 days   Minutes of Exercise per Session: 0 min  Stress: No Stress Concern Present   Feeling of Stress : Not at all  Social Connections: Moderately Integrated   Frequency of Communication with Friends and Family: More than three times a week   Frequency of Social Gatherings with Friends and Family: More than three times a week   Attends Religious Services: More than 4 times per year   Active Member of [employer] or Organizations: Yes   Attends Archivist Meetings: More than 4 times per year   Marital Status: Widowed    Tobacco Counseling Counseling given: Not Answered Tobacco comments: smoked 1963- present, up to 1/2 ppd   Clinical Intake:  Pre-visit preparation completed: Yes  Pain : No/denies pain Pain Score: 0-No pain     BMI - recorded: 31.45 Nutritional Status: BMI > 30  Obese Nutritional Risks: None Diabetes: Yes CBG done?: No Did pt. bring in CBG monitor from home?: No  How often do you need to have someone help you when you read instructions, pamphlets, or other written materials from your doctor or pharmacy?: 1 - Never What is the last grade level you completed in school?: High School Graduate  Diabetic? yes  Interpreter  Needed?: No  Information entered by :: Lisette Abu, LPN   Activities of Daily Living In your present state of health, do you have any difficulty performing the following activities: 10/17/2020 02/09/2020  Hearing? N N  Vision? N N  Difficulty concentrating or making decisions? N N  Walking or climbing stairs? N N  Dressing or bathing? N N  Doing errands, shopping? N N  Preparing Food and eating ? N -  Using the Toilet? N -  In the past six months, have you accidently leaked urine? N -  Do you have problems with loss of bowel control? N -  Managing your Medications? N -  Managing your Finances? N -  Housekeeping or managing your Housekeeping? N -  Some recent data might be hidden    Patient Care Team: Binnie Rail, MD as PCP - General (Internal Medicine) Paula Compton, MD as Consulting Physician (Obstetrics and Gynecology) Monna Fam, MD as Consulting Physician (Ophthalmology)  Indicate any recent Medical Services you may have received from other than [facility] providers in the past year (date may be approximate).     Assessment:   This is a routine wellness examination for Susan Holland.  Hearing/Vision screen Hearing Screening - Comments:: Patient denied any hearing difficulty. Vision Screening - Comments:: Annual eye exam done by Dr. Monna Fam.  Dietary issues and exercise activities discussed: Current Exercise Habits: The patient does not participate in regular exercise at present, Exercise limited by: None identified   Goals Addressed   None   Depression Screen PHQ 2/9 Scores 10/17/2020 02/09/2020 08/15/2018 06/05/2017 05/17/2014 04/13/2013  PHQ - 2 Score 0 0 0 1 0 0  PHQ- 9 Score - - - 5 - -    Fall Risk Fall Risk  10/17/2020 02/09/2020 12/31/2018 08/15/2018 06/05/2017  Falls in the past year? 0 0 0 0 Yes  Comment - - Emmi Telephone Survey: data to providers prior to load - -  Number falls in past yr: 0 0 - 0 1  Injury with Fall? 0 0 - - -  Risk for fall due to : No  Fall Risks No Fall Risks - - -  Follow up Falls evaluation completed Falls evaluation completed - - Falls prevention discussed    FALL RISK PREVENTION PERTAINING TO THE HOME:  Any stairs in or around the home? Yes  If so, are there any without handrails? No  Home free of loose throw rugs in walkways, pet beds, electrical cords, etc? Yes  Adequate lighting in your home to reduce risk of falls? Yes   ASSISTIVE DEVICES UTILIZED TO PREVENT FALLS:  Life alert? No  Use of a cane, walker or w/c? No  Grab bars in the bathroom? No  Shower chair or bench in shower? No  Elevated toilet seat or a handicapped toilet? No   TIMED UP AND GO:  Was the test performed? Yes .  Length of time to ambulate 10 feet: 6 sec.   Gait steady and fast without use of assistive device  Cognitive Function: Normal cognitive status assessed by direct observation by this Nurse Health Advisor. No abnormalities found.   MMSE - Mini Mental State Exam 06/05/2017  Orientation to time 5  Orientation to Place 5  Registration 3  Attention/ Calculation 3  Recall 1  Language- name 2 objects 2  Language- repeat 1  Language- follow 3 step command 3  Language- read & follow direction 1  Write a sentence 1  Copy design 1  Total score 26        Immunizations Immunization History  Administered Date(s) Administered   Fluad Quad(high Dose 65+) 11/21/2018   Influenza Whole 10/29/2007   Influenza, High Dose Seasonal PF 11/14/2016, 11/06/2017, 11/26/2019   Influenza,inj,Quad PF,6+ Mos 11/14/2012   Influenza-Unspecified 11/04/2013, 12/16/2014, 11/10/2015   PFIZER(Purple Top)SARS-COV-2 Vaccination 03/21/2019, 04/15/2019, 01/14/2020   Pneumococcal Conjugate-13 10/05/2014   Pneumococcal Polysaccharide-23 03/19/2011   Td 03/17/2010   Zoster Recombinat (Shingrix) 06/10/2017, 09/05/2017   Zoster, Live 07/02/2006    TDAP status: Due, Education has been provided regarding the importance of this vaccine. Advised may receive  this vaccine at local pharmacy or Health Dept. Aware to  provide a copy of the vaccination record if obtained from local pharmacy or Health Dept. Verbalized acceptance and understanding.  Flu Vaccine status: Up to date  Pneumococcal vaccine status: Up to date  Covid-19 vaccine status: Completed vaccines  Qualifies for Shingles Vaccine? Yes   Zostavax completed Yes   Shingrix Completed?: Yes  Screening Tests Health Maintenance  Topic Date Due   FOOT EXAM  02/17/2020   COVID-19 Vaccine (4 - Booster for Pfizer series) 04/07/2020   OPHTHALMOLOGY EXAM  07/07/2020   INFLUENZA VACCINE  09/05/2020   TETANUS/TDAP  08/12/2021 (Originally 03/17/2020)   HEMOGLOBIN A1C  02/12/2021   COLONOSCOPY (Pts 45-80yr Insurance coverage will need to be confirmed)  03/12/2021   DEXA SCAN  03/10/2022   Hepatitis C Screening  Completed   PNA vac Low Risk Adult  Completed   Zoster Vaccines- Shingrix  Completed   HPV VACCINES  Aged Out    Health Maintenance  Health Maintenance Due  Topic Date Due   FOOT EXAM  02/17/2020   COVID-19 Vaccine (4 - Booster for Pfizer series) 04/07/2020   OPHTHALMOLOGY EXAM  07/07/2020   INFLUENZA VACCINE  09/05/2020    Colorectal cancer screening: Type of screening: Colonoscopy. Completed 03/13/2011. Repeat every 10 years  Mammogram status: Completed 04/2020. Repeat every year  Bone Density status: Completed 03/10/2020. Results reflect: Bone density results: OSTEOPENIA. Repeat every 2 years.  Lung Cancer Screening: (Low Dose CT Chest recommended if Age 75-80years, 30 pack-year currently smoking OR have quit w/in 15years.) does not qualify.   Lung Cancer Screening Referral: no  Additional Screening:  Hepatitis C Screening: does qualify; Completed yes  Vision Screening: Recommended annual ophthalmology exams for early detection of glaucoma and other disorders of the eye. Is the patient up to date with their annual eye exam?  Yes  Who is the provider or what is the name  of the office in which the patient attends annual eye exams? Dr. KMonna FamIf pt is not established with a provider, would they like to be referred to a provider to establish care? No .   Dental Screening: Recommended annual dental exams for proper oral hygiene  Community Resource Referral / Chronic Care Management: CRR required this visit?  No   CCM required this visit?  No      Plan:     I have personally reviewed and noted the following in the patient's chart:   Medical and social history Use of alcohol, tobacco or illicit drugs  Current medications and supplements including opioid prescriptions.  Functional ability and status Nutritional status Physical activity Advanced directives List of other physicians Hospitalizations, surgeries, and ER visits in previous 12 months Vitals Screenings to include cognitive, depression, and falls Referrals and appointments  In addition, I have reviewed and discussed with patient certain preventive protocols, quality metrics, and best practice recommendations. A written personalized care plan for preventive services as well as general preventive health recommendations were provided to patient.     SSheral Flow LPN   [id number]  Nurse Notes:  Hearing Screening - Comments:: Patient denied any hearing difficulty. Vision Screening - Comments:: Annual eye exam done by Dr. KMonna Fam

## 2020-10-17 NOTE — Patient Instructions (Signed)
Susan Holland , Thank you for taking time to come for your Medicare Wellness Visit. I appreciate your ongoing commitment to your health goals. Please review the following plan we discussed and let me know if I can assist you in the future.   Screening recommendations/referrals: Colonoscopy: 03/13/2011; due every 10 years Mammogram: 04/2020; due every year Bone Density: 03/10/2020; due every 2 years Recommended yearly ophthalmology/optometry visit for glaucoma screening and checkup Recommended yearly dental visit for hygiene and checkup  Vaccinations: Influenza vaccine: 10/17/2020 Pneumococcal vaccine: 03/19/2011, 10/05/2014 Tdap vaccine: 03/17/2010 due every 10 years (Overdue) Shingles vaccine: 06/10/2017, 09/05/2017   Covid-19: 03/21/2019, 04/15/2019, 01/14/2020  Advanced directives: Please bring a copy of your health care power of attorney and living will to the office at your convenience.   Conditions/risks identified: Yes; Client understands the importance of follow-up with providers by attending scheduled visits and discussed goals to eat healthier, increase physical activity, exercise the brain, socialize more, get enough sleep and make time for laughter.  Next appointment: Please schedule your next Medicare Wellness Visit with your Nurse Health Advisor in 1 year by calling [phone].   Preventive Care 75 Years and Older, Female Preventive care refers to lifestyle choices and visits with your health care provider that can promote health and wellness. What does preventive care include? A yearly physical exam. This is also called an annual well check. Dental exams once or twice a year. Routine eye exams. Ask your health care provider how often you should have your eyes checked. Personal lifestyle choices, including: Daily care of your teeth and gums. Regular physical activity. Eating a healthy diet. Avoiding tobacco and drug use. Limiting alcohol use. Practicing safe sex. Taking low-dose  aspirin every day. Taking vitamin and mineral supplements as recommended by your health care provider. What happens during an annual well check? The services and screenings done by your health care provider during your annual well check will depend on your age, overall health, lifestyle risk factors, and family history of disease. Counseling  Your health care provider may ask you questions about your: Alcohol use. Tobacco use. Drug use. Emotional well-being. Home and relationship well-being. Sexual activity. Eating habits. History of falls. Memory and ability to understand (cognition). Work and work Statistician. Reproductive health. Screening  You may have the following tests or measurements: Height, weight, and BMI. Blood pressure. Lipid and cholesterol levels. These may be checked every 5 years, or more frequently if you are over 90 years old. Skin check. Lung cancer screening. You may have this screening every year starting at age 24 if you have a 30-pack-year history of smoking and currently smoke or have quit within the past 15 years. Fecal occult blood test (FOBT) of the stool. You may have this test every year starting at age 75. Flexible sigmoidoscopy or colonoscopy. You may have a sigmoidoscopy every 5 years or a colonoscopy every 10 years starting at age 75. Hepatitis C blood test. Hepatitis B blood test. Sexually transmitted disease (STD) testing. Diabetes screening. This is done by checking your blood sugar (glucose) after you have not eaten for a while (fasting). You may have this done every 1-3 years. Bone density scan. This is done to screen for osteoporosis. You may have this done starting at age 67. Mammogram. This may be done every 1-2 years. Talk to your health care provider about how often you should have regular mammograms. Talk with your health care provider about your test results, treatment options, and if necessary, the need for  more tests. Vaccines  Your  health care provider may recommend certain vaccines, such as: Influenza vaccine. This is recommended every year. Tetanus, diphtheria, and acellular pertussis (Tdap, Td) vaccine. You may need a Td booster every 10 years. Zoster vaccine. You may need this after age 35. Pneumococcal 13-valent conjugate (PCV13) vaccine. One dose is recommended after age 8. Pneumococcal polysaccharide (PPSV23) vaccine. One dose is recommended after age 87. Talk to your health care provider about which screenings and vaccines you need and how often you need them. This information is not intended to replace advice given to you by your health care provider. Make sure you discuss any questions you have with your health care provider. Document Released: 02/18/2015 Document Revised: 10/12/2015 Document Reviewed: 11/23/2014 Elsevier Interactive Patient Education  2017 [place] Prevention in the Home Falls can cause injuries. They can happen to people of all ages. There are many things you can do to make your home safe and to help prevent falls. What can I do on the outside of my home? Regularly fix the edges of walkways and driveways and fix any cracks. Remove anything that might make you trip as you walk through a door, such as a raised step or threshold. Trim any bushes or trees on the path to your home. Use bright outdoor lighting. Clear any walking paths of anything that might make someone trip, such as rocks or tools. Regularly check to see if handrails are loose or broken. Make sure that both sides of any steps have handrails. Any raised decks and porches should have guardrails on the edges. Have any leaves, snow, or ice cleared regularly. Use sand or salt on walking paths during winter. Clean up any spills in your garage right away. This includes oil or grease spills. What can I do in the bathroom? Use night lights. Install grab bars by the toilet and in the tub and shower. Do not use towel bars as  grab bars. Use non-skid mats or decals in the tub or shower. If you need to sit down in the shower, use a plastic, non-slip stool. Keep the floor dry. Clean up any water that spills on the floor as soon as it happens. Remove soap buildup in the tub or shower regularly. Attach bath mats securely with double-sided non-slip rug tape. Do not have throw rugs and other things on the floor that can make you trip. What can I do in the bedroom? Use night lights. Make sure that you have a light by your bed that is easy to reach. Do not use any sheets or blankets that are too big for your bed. They should not hang down onto the floor. Have a firm chair that has side arms. You can use this for support while you get dressed. Do not have throw rugs and other things on the floor that can make you trip. What can I do in the kitchen? Clean up any spills right away. Avoid walking on wet floors. Keep items that you use a lot in easy-to-reach places. If you need to reach something above you, use a strong step stool that has a grab bar. Keep electrical cords out of the way. Do not use floor polish or wax that makes floors slippery. If you must use wax, use non-skid floor wax. Do not have throw rugs and other things on the floor that can make you trip. What can I do with my stairs? Do not leave any items on the stairs. Make  sure that there are handrails on both sides of the stairs and use them. Fix handrails that are broken or loose. Make sure that handrails are as long as the stairways. Check any carpeting to make sure that it is firmly attached to the stairs. Fix any carpet that is loose or worn. Avoid having throw rugs at the top or bottom of the stairs. If you do have throw rugs, attach them to the floor with carpet tape. Make sure that you have a light switch at the top of the stairs and the bottom of the stairs. If you do not have them, ask someone to add them for you. What else can I do to help prevent  falls? Wear shoes that: Do not have high heels. Have rubber bottoms. Are comfortable and fit you well. Are closed at the toe. Do not wear sandals. If you use a stepladder: Make sure that it is fully opened. Do not climb a closed stepladder. Make sure that both sides of the stepladder are locked into place. Ask someone to hold it for you, if possible. Clearly mark and make sure that you can see: Any grab bars or handrails. First and last steps. Where the edge of each step is. Use tools that help you move around (mobility aids) if they are needed. These include: Canes. Walkers. Scooters. Crutches. Turn on the lights when you go into a dark area. Replace any light bulbs as soon as they burn out. Set up your furniture so you have a clear path. Avoid moving your furniture around. If any of your floors are uneven, fix them. If there are any pets around you, be aware of where they are. Review your medicines with your doctor. Some medicines can make you feel dizzy. This can increase your chance of falling. Ask your doctor what other things that you can do to help prevent falls. This information is not intended to replace advice given to you by your health care provider. Make sure you discuss any questions you have with your health care provider. Document Released: 11/18/2008 Document Revised: 06/30/2015 Document Reviewed: 02/26/2014 Elsevier Interactive Patient Education  2017 [employer].

## 2020-11-01 ENCOUNTER — Telehealth: Payer: Self-pay

## 2020-11-01 NOTE — Telephone Encounter (Signed)
Prolia VOB initiated via [email or web address]  Last OV:  Next OV:  Last Prolia inj: 01/04/20 Next Prolia inj DUE: 07/04/20

## 2020-11-09 DIAGNOSIS — H40013 Open angle with borderline findings, low risk, bilateral: Secondary | ICD-10-CM | POA: Diagnosis not present

## 2020-11-09 DIAGNOSIS — H524 Presbyopia: Secondary | ICD-10-CM | POA: Diagnosis not present

## 2020-11-09 DIAGNOSIS — H04123 Dry eye syndrome of bilateral lacrimal glands: Secondary | ICD-10-CM | POA: Diagnosis not present

## 2020-11-09 DIAGNOSIS — H35371 Puckering of macula, right eye: Secondary | ICD-10-CM | POA: Diagnosis not present

## 2020-11-09 DIAGNOSIS — Z961 Presence of intraocular lens: Secondary | ICD-10-CM | POA: Diagnosis not present

## 2020-11-10 NOTE — Telephone Encounter (Signed)
Pt ready for scheduling on or after 07/04/20  Out-of-pocket cost due at time of visit: $285  Primary: [employer] Prolia co-insurance: 20% (approximately $255) Admin fee co-insurance: 20% (approximately $25)  Secondary: n/a Prolia co-insurance:  Admin fee co-insurance:   Deductible: Does not apply  Prior Auth: not required PA# Valid:   ** This summary of benefits is an estimation of the patient's out-of-pocket cost. Exact cost may vary based on individual plan coverage.

## 2020-12-14 DIAGNOSIS — L728 Other follicular cysts of the skin and subcutaneous tissue: Secondary | ICD-10-CM | POA: Diagnosis not present

## 2020-12-14 DIAGNOSIS — Z85820 Personal history of malignant melanoma of skin: Secondary | ICD-10-CM | POA: Diagnosis not present

## 2020-12-14 DIAGNOSIS — Z08 Encounter for follow-up examination after completed treatment for malignant neoplasm: Secondary | ICD-10-CM | POA: Diagnosis not present

## 2020-12-14 DIAGNOSIS — D225 Melanocytic nevi of trunk: Secondary | ICD-10-CM | POA: Diagnosis not present

## 2020-12-14 DIAGNOSIS — L570 Actinic keratosis: Secondary | ICD-10-CM | POA: Diagnosis not present

## 2020-12-14 DIAGNOSIS — D2272 Melanocytic nevi of left lower limb, including hip: Secondary | ICD-10-CM | POA: Diagnosis not present

## 2020-12-14 DIAGNOSIS — L814 Other melanin hyperpigmentation: Secondary | ICD-10-CM | POA: Diagnosis not present

## 2020-12-14 DIAGNOSIS — L819 Disorder of pigmentation, unspecified: Secondary | ICD-10-CM | POA: Diagnosis not present

## 2020-12-14 DIAGNOSIS — L812 Freckles: Secondary | ICD-10-CM | POA: Diagnosis not present

## 2020-12-14 DIAGNOSIS — L821 Other seborrheic keratosis: Secondary | ICD-10-CM | POA: Diagnosis not present

## 2021-01-02 ENCOUNTER — Telehealth (INDEPENDENT_AMBULATORY_CARE_PROVIDER_SITE_OTHER): Payer: PPO | Admitting: Family Medicine

## 2021-01-02 ENCOUNTER — Encounter: Payer: Self-pay | Admitting: Family Medicine

## 2021-01-02 ENCOUNTER — Encounter (HOSPITAL_COMMUNITY): Payer: Self-pay | Admitting: *Deleted

## 2021-01-02 ENCOUNTER — Emergency Department (HOSPITAL_COMMUNITY)
Admission: EM | Admit: 2021-01-02 | Discharge: 2021-01-03 | Disposition: A | Discharge status: Home / Self Care | Payer: PPO | Attending: Emergency Medicine | Admitting: Emergency Medicine

## 2021-01-02 ENCOUNTER — Other Ambulatory Visit: Payer: Self-pay

## 2021-01-02 VITALS — Temp 99.6°F | Ht 65.0 in

## 2021-01-02 DIAGNOSIS — Z79899 Other long term (current) drug therapy: Secondary | ICD-10-CM | POA: Insufficient documentation

## 2021-01-02 DIAGNOSIS — R8290 Unspecified abnormal findings in urine: Secondary | ICD-10-CM | POA: Diagnosis not present

## 2021-01-02 DIAGNOSIS — R509 Fever, unspecified: Secondary | ICD-10-CM | POA: Diagnosis not present

## 2021-01-02 DIAGNOSIS — I6381 Other cerebral infarction due to occlusion or stenosis of small artery: Secondary | ICD-10-CM | POA: Diagnosis not present

## 2021-01-02 DIAGNOSIS — I700 Atherosclerosis of aorta: Secondary | ICD-10-CM | POA: Diagnosis not present

## 2021-01-02 DIAGNOSIS — N390 Urinary tract infection, site not specified: Secondary | ICD-10-CM | POA: Diagnosis not present

## 2021-01-02 DIAGNOSIS — U071 COVID-19: Secondary | ICD-10-CM | POA: Diagnosis not present

## 2021-01-02 DIAGNOSIS — R4182 Altered mental status, unspecified: Secondary | ICD-10-CM | POA: Diagnosis not present

## 2021-01-02 DIAGNOSIS — R059 Cough, unspecified: Secondary | ICD-10-CM | POA: Diagnosis not present

## 2021-01-02 DIAGNOSIS — E119 Type 2 diabetes mellitus without complications: Secondary | ICD-10-CM | POA: Diagnosis not present

## 2021-01-02 DIAGNOSIS — Z85820 Personal history of malignant melanoma of skin: Secondary | ICD-10-CM | POA: Diagnosis not present

## 2021-01-02 DIAGNOSIS — I10 Essential (primary) hypertension: Secondary | ICD-10-CM | POA: Diagnosis not present

## 2021-01-02 DIAGNOSIS — Z87891 Personal history of nicotine dependence: Secondary | ICD-10-CM | POA: Diagnosis not present

## 2021-01-02 DIAGNOSIS — G319 Degenerative disease of nervous system, unspecified: Secondary | ICD-10-CM | POA: Diagnosis not present

## 2021-01-02 DIAGNOSIS — Z96641 Presence of right artificial hip joint: Secondary | ICD-10-CM | POA: Diagnosis not present

## 2021-01-02 DIAGNOSIS — R0902 Hypoxemia: Secondary | ICD-10-CM | POA: Diagnosis not present

## 2021-01-02 DIAGNOSIS — R0602 Shortness of breath: Secondary | ICD-10-CM | POA: Diagnosis not present

## 2021-01-02 DIAGNOSIS — R0689 Other abnormalities of breathing: Secondary | ICD-10-CM | POA: Diagnosis not present

## 2021-01-02 DIAGNOSIS — E860 Dehydration: Secondary | ICD-10-CM | POA: Diagnosis not present

## 2021-01-02 DIAGNOSIS — J9811 Atelectasis: Secondary | ICD-10-CM | POA: Diagnosis not present

## 2021-01-02 LAB — CBC WITH DIFFERENTIAL/PLATELET
Abs Immature Granulocytes: 0.05 10*3/uL (ref 0.00–0.07)
Basophils Absolute: 0 10*3/uL (ref 0.0–0.1)
Basophils Relative: 0 %
Eosinophils Absolute: 0 10*3/uL (ref 0.0–0.5)
Eosinophils Relative: 0 %
HCT: 39.2 % (ref 36.0–46.0)
Hemoglobin: 12.9 g/dL (ref 12.0–15.0)
Immature Granulocytes: 0 %
Lymphocytes Relative: 5 %
Lymphs Abs: 0.6 10*3/uL — ABNORMAL LOW (ref 0.7–4.0)
MCH: 30.6 pg (ref 26.0–34.0)
MCHC: 32.9 g/dL (ref 30.0–36.0)
MCV: 92.9 fL (ref 80.0–100.0)
Monocytes Absolute: 1.1 10*3/uL — ABNORMAL HIGH (ref 0.1–1.0)
Monocytes Relative: 9 %
Neutro Abs: 10 10*3/uL — ABNORMAL HIGH (ref 1.7–7.7)
Neutrophils Relative %: 86 %
Platelets: 182 10*3/uL (ref 150–400)
RBC: 4.22 MIL/uL (ref 3.87–5.11)
RDW: 13.3 % (ref 11.5–15.5)
WBC: 11.6 10*3/uL — ABNORMAL HIGH (ref 4.0–10.5)
nRBC: 0 % (ref 0.0–0.2)

## 2021-01-02 LAB — BASIC METABOLIC PANEL
Anion gap: 10 (ref 5–15)
BUN: 15 mg/dL (ref 8–23)
CO2: 23 mmol/L (ref 22–32)
Calcium: 8.4 mg/dL — ABNORMAL LOW (ref 8.9–10.3)
Chloride: 102 mmol/L (ref 98–111)
Creatinine, Ser: 0.89 mg/dL (ref 0.44–1.00)
GFR, Estimated: >60 mL/min (ref >60)
Glucose, Bld: 150 mg/dL — ABNORMAL HIGH (ref 70–99)
Potassium: 3.6 mmol/L (ref 3.5–5.1)
Sodium: 135 mmol/L (ref 135–145)

## 2021-01-02 MED ORDER — MOLNUPIRAVIR EUA 200MG CAPSULE: 4.0000 | ORAL_CAPSULE | Freq: Two times a day (BID) | ORAL | 0 refills | Status: AC

## 2021-01-02 MED ORDER — SULFAMETHOXAZOLE-TRIMETHOPRIM 800-160 MG PO TABS: 1.0000 | ORAL_TABLET | Freq: Two times a day (BID) | ORAL | 0 refills | Status: DC

## 2021-01-02 NOTE — Progress Notes (Signed)
Susan Hillmer T. Terrian Ridlon, MD [facility] [place] [facility] [place], [address] Phone: [phone]  FAX: [place] - 75 y.o. female  MRN [id number]  Date of Birth: Oct 27, 1945  Visit Date: 01/02/2021  PCP: Binnie Rail, MD  Referred by: Binnie Rail, MD  Virtual Visit via Telephone Note:  Chief Complaint  Patient presents with   Covid Positive    [phone] Homes Test Yesterday-Symptoms started Saturday evening   Sore Throat   Fever   Generalized Body Aches   Altered Mental Status    Concerned she may have UTI-Abnormal Odor to Urine     I connected with  JENI DULING on 01/02/2021 11:20 AM EST by telephone and verified that I am speaking with the correct person using two identifiers.   Location patient: home phone or cell phone Location provider: work or home office Consent: Verbal consent directly obtained from [employer] and that there may be a patient responsible charge related to this service. Persons participating in the virtual visit: patient, provider  I discussed the limitations of evaluation and management by telemedicine and the availability of in person appointments.  The patient expressed understanding and agreed to proceed.     History of Present Illness:  + Covid yesterday Urine has a bad odor. ? dysuria or urgency. Not drinking well.  Aches, ST 99 temp Some congestion and coughing No diarrhea No changes in taste or smell. No n/v. The patient sounded appropriate and answered all questions on the phone.   No smoking for 8 years.   Review of Systems: pertinent positives and pertinent negatives as per HPI No acute distress verbally   Observations/Objective/Exam:  An attempt was made to discern vital signs over the phone and per patient if applicable and possible.   Neurological:     Mental Status: pleasant and appropriate    Psychiatric:        Thought Content: Thought content normal.      Assessment and Plan:    ICD-10-CM   1. COVID-19  U07.1     2. Abnormal urine odor  R82.90      Total encounter time: 10 minutes. This includes total time spent on the day of encounter.  + Covid yesterday, sx started on Saturday.  Will start antivirals and continue supportive care.  Daughter was on the call the entire time.   Bad urine odor - possible just dehydration, but my practice would be to treat in this case.  Culture more ideal, but difficult here in a quite sick person at home.  I discussed the assessment and treatment plan with the patient. The patient was provided an opportunity to ask questions and all were answered. The patient agreed with the plan and demonstrated an understanding of the instructions.   The patient was advised to call back or seek an in-person evaluation if the symptoms worsen or if the condition fails to improve as anticipated.  Follow-up: prn unless noted otherwise below No follow-ups on file.  Meds ordered this encounter  Medications   molnupiravir EUA (LAGEVRIO) 200 mg CAPS capsule    Sig: Take 4 capsules (800 mg total) by mouth 2 (two) times daily for 5 days.    Dispense:  40 capsule    Refill:  0   sulfamethoxazole-trimethoprim (BACTRIM DS) 800-160 MG tablet    Sig: Take 1 tablet by mouth 2 (two) times daily.  Dispense:  14 tablet    Refill:  0   No orders of the defined types were placed in this encounter.   Signed,  Maud Deed. Burlon Centrella, MD

## 2021-01-02 NOTE — ED Triage Notes (Addendum)
Pt arrived with [facility] for generally not feeling well since Saturday. Dx covid yesterday from home test, reported to be orthostatic 170/ sitting, 120/ standing. EMS placed 20g hand, given 1L NS, given 1000mg  fever EMS VS Co2 36-40, 163/98, rr 20-23  Per patient, son called ems after she wasn't responding to him. Pt reports feeling at baseline at present

## 2021-01-02 NOTE — ED Provider Notes (Signed)
Emergency Medicine Provider Triage Evaluation Note  Susan Holland , a 75 y.o. female  was evaluated in triage.  Pt complains of mental status.  History divided by the patient and her daughter who is at bedside.  Per the patient her mother started having viral symptoms on Saturday, she test positive for COVID yesterday and started on will need peer-to-peer.  She also has been having urinary incontinence that started around the same time without any associated dysuria.  Started on Bactrim, first dose was today.  The daughter was dropping off the medicine this evening and found her mother on the floor unable to get up, although she is oriented x3 per the family she is not at her typical baseline.  She usually lives independently and is very "spunky".  Review of Systems  Positive: above Negative: above  Physical Exam  BP (!) 150/76   Pulse 94   Temp 99.6 F (37.6 C)   Resp 16   SpO2 91%  Gen:   Awake, no distress   Resp:  Normal effort  MSK:   Moves extremities without difficulty  Other:  Cranial nerves II through XII are grossly intact, oriented x3, no dysarthria  Medical Decision Making  Medically screening exam initiated at 8:33 PM.  Appropriate orders placed.  Shannie Kontos Dempster was informed that the remainder of the evaluation will be completed by another provider, this initial triage assessment does not replace that evaluation, and the importance of remaining in the ED until their evaluation is complete.  Altered mental status work   Sherrill Raring, Hershal Coria 01/02/21 2034    Lacretia Leigh, MD 01/03/21 [phone]

## 2021-01-03 ENCOUNTER — Emergency Department (HOSPITAL_COMMUNITY): Payer: PPO

## 2021-01-03 ENCOUNTER — Other Ambulatory Visit: Payer: Self-pay

## 2021-01-03 ENCOUNTER — Encounter (HOSPITAL_COMMUNITY): Payer: Self-pay

## 2021-01-03 DIAGNOSIS — G319 Degenerative disease of nervous system, unspecified: Secondary | ICD-10-CM | POA: Diagnosis not present

## 2021-01-03 DIAGNOSIS — I6381 Other cerebral infarction due to occlusion or stenosis of small artery: Secondary | ICD-10-CM | POA: Diagnosis not present

## 2021-01-03 DIAGNOSIS — J9811 Atelectasis: Secondary | ICD-10-CM | POA: Diagnosis not present

## 2021-01-03 DIAGNOSIS — R059 Cough, unspecified: Secondary | ICD-10-CM | POA: Diagnosis not present

## 2021-01-03 DIAGNOSIS — I700 Atherosclerosis of aorta: Secondary | ICD-10-CM | POA: Diagnosis not present

## 2021-01-03 DIAGNOSIS — R509 Fever, unspecified: Secondary | ICD-10-CM | POA: Diagnosis not present

## 2021-01-03 LAB — URINALYSIS, ROUTINE W REFLEX MICROSCOPIC
Bilirubin Urine: NEGATIVE
Glucose, UA: NEGATIVE mg/dL
Hgb urine dipstick: NEGATIVE
Ketones, ur: NEGATIVE mg/dL
Nitrite: NEGATIVE
Protein, ur: 30 mg/dL — AB
Specific Gravity, Urine: 1.019 (ref 1.005–1.030)
pH: 5 (ref 5.0–8.0)

## 2021-01-03 LAB — URINE CULTURE

## 2021-01-03 MED ORDER — ACETAMINOPHEN 500 MG PO TABS
ORAL_TABLET | ORAL | Status: AC
  Filled 2021-01-03: qty 2

## 2021-01-03 MED ORDER — SODIUM CHLORIDE 0.9 % IV BOLUS
500.0000 mL | Freq: Once | INTRAVENOUS | Status: AC
  Administered 2021-01-03: 500 mL via INTRAVENOUS

## 2021-01-03 MED ORDER — SODIUM CHLORIDE 0.9 % IV SOLN
INTRAVENOUS | Status: AC
  Filled 2021-01-03: qty 10

## 2021-01-03 MED ORDER — SODIUM CHLORIDE 0.9 % IV SOLN
1.0000 g | Freq: Once | INTRAVENOUS | Status: AC
  Administered 2021-01-03: 1 g via INTRAVENOUS

## 2021-01-03 MED ORDER — SUCRALFATE 1 G PO TABS
ORAL_TABLET | ORAL | Status: AC
  Filled 2021-01-03: qty 1

## 2021-01-03 MED ORDER — ACETAMINOPHEN 325 MG PO TABS
ORAL_TABLET | ORAL | Status: AC
  Filled 2021-01-03: qty 2

## 2021-01-03 NOTE — Discharge Instructions (Addendum)
It was our pleasure to provide your ER care today - we hope that you feel better.  See attached covid information.   Your lab work also shows a possible urine infection. Complete the course of your antibiotic and anti-viral meds.   Drink plenty of fluids/stay well hydrated. Stay active, take full and deep breaths. Take acetaminophen as need for fever or body aches.   Follow up with primary care doctor in the next couple weeks if symptoms fail to improve/resolve.  Return to ER if worse, new symptoms, increased trouble breathing, or other concern.

## 2021-01-03 NOTE — ED Provider Notes (Signed)
[facility] EMERGENCY DEPARTMENT Provider Note   CSN: [id number] Arrival date & time: 01/02/21  2018     History Chief Complaint  Patient presents with   Altered Mental Status    Susan Holland is a 75 y.o. female.  Patient c/o nasal congestion, mildly sore throat, non prod cough in past 2-3 days. Symptoms acute onset, moderate, constant, persistent. Had covid test + yesterday - was started on molnupiravir. Also noted recent strong odor to urine and placed on bactrim yesterday for possible uti. No dysuria. No abd or flank pain. No nv. Decreased po intake in past couple days, generally feels weak. No chest pain or discomfort. No sob. No headache.   The history is provided by the patient, a relative and medical records.  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   SOB (shortness of breath) on exertion 02/09/2020   S/P total hip arthroplasty, 10/2018 02/16/2019   Primary localized osteoarthritis of right hip 10/14/2018   Groin pain, right 07/16/2018   Osteoporosis 11/29/2017   Diabetes mellitus without complication ([place]) 92/12/9415   Difficulty sleeping 05/21/2016   Nonspecific abnormal electrocardiogram (ECG) (EKG) 05/18/2014   Essential hypertension 05/17/2014   Glaucoma suspect 05/04/2013   Skin cancer 03/19/2011   History of colonic polyps 03/17/2010   HYPERLIPIDEMIA 03/06/2007    Past Surgical History:  Procedure Laterality Date   CATARACT EXTRACTION Bilateral 2016   COLONOSCOPY W/ POLYPECTOMY  03/2011   repeat 2020; Dr Olevia Perches   DEEP LEG / ANKLE TUMOR EXCISION      X 2 RLE ; benign    TONSILLECTOMY AND ADENOIDECTOMY     TOTAL HIP ARTHROPLASTY Right 10/14/2018   Procedure: RIght Anterior Hip Arthroplasty;  Surgeon: Melrose Nakayama, MD;  Location: [facility];  Service: Orthopedics;  Laterality: Right;   TUBAL LIGATION       OB History   No obstetric history on file.     Family History  Problem Relation Age of Onset   Dementia Mother        Alsheimer's   Stroke Father 5   Heart disease Sister        AF   Colon cancer Maternal Uncle        X 2   Diabetes Neg Hx     Social History   Tobacco Use   Smoking status: Former    Types: Cigarettes    Quit date: 03/29/2015    Years since quitting: 5.7   Smokeless tobacco: Never   Tobacco comments:    smoked 1963- present, up to 1/2 ppd  Vaping Use   Vaping Use: Never used  Substance Use Topics   Alcohol use: Yes    Alcohol/week: 0.0 standard drinks    Comment:  occasionally    Drug use: No    Home Medications Prior to Admission medications   Medication Sig Start Date End Date Taking? Authorizing Provider  atorvastatin (LIPITOR) 20 MG tablet Take 1 tablet (20 mg total) by mouth every evening. 08/12/20   Binnie Rail, MD  Calcium Carbonate-Vitamin D (CALCIUM + D  PO) Take 1 tablet by mouth 2 (two) times daily.     [provider]  denosumab (PROLIA) 60 MG/ML SOSY injection Inject 60 mg into the skin every 6 (six) months. Patient not taking: Reported on 08/12/2020 12/10/19   Binnie Rail, MD  fluocinonide-emollient (LIDEX-E) 0.05 % cream Apply 1 application topically 2 (two) times daily. 08/01/20   Janith Lima, MD  hydrOXYzine (VISTARIL) 25 MG capsule Take 1 capsule (25 mg total) by mouth every 8 (eight) hours as needed. Patient not taking: Reported on 10/17/2020 08/01/20   Janith Lima, MD  losartan-hydrochlorothiazide [facility]) 100-12.5 MG tablet Take 1 tablet by mouth daily. 08/12/20   Binnie Rail, MD  molnupiravir EUA (LAGEVRIO) 200 mg CAPS capsule Take 4 capsules (800 mg total) by mouth 2 (two) times  daily for 5 days. 01/02/21 01/07/21  CoplandFrederico Hamman, MD  Multiple Vitamin (MULTIVITAMIN WITH MINERALS) TABS tablet Take 1 tablet by mouth daily.    [provider]  neomycin-polymyxin-dexameth (MAXITROL) 0.1 % OINT neomycin 3.5 mg/g-polymyxin B 10,000 unit/g-dexameth 0.1 % eye oint  APPLY A SMALL AMOUNT TO LEFT LOWER LID 3 TIMES A DAY AND AT BEDTIME FOR 2 WEEKS    [provider]  Omega-3 Fatty Acids (FISH OIL) 1200 MG CAPS Take 1,200 mg by mouth 2 (two) times daily.    [provider]  sulfamethoxazole-trimethoprim (BACTRIM DS) 800-160 MG tablet Take 1 tablet by mouth 2 (two) times daily. 01/02/21   Owens Loffler, MD    Allergies    Patient has no known allergies.  Review of Systems   Review of Systems  Constitutional:  Positive for fever.  HENT:  Positive for congestion and sore throat.   Eyes:  Negative for pain, redness and visual disturbance.  Respiratory:  Positive for cough. Negative for shortness of breath.   Cardiovascular:  Negative for chest pain.  Gastrointestinal:  Negative for abdominal pain, diarrhea and vomiting.  Genitourinary:  Negative for dysuria and flank pain.  Musculoskeletal:  Negative for back pain, neck pain and neck stiffness.  Skin:  Negative for rash.  Neurological:  Negative for headaches.  Hematological:  Does not bruise/bleed easily.  Psychiatric/Behavioral:  Negative for confusion.    Physical Exam Updated Vital Signs BP 112/68   Pulse 76   Temp 97.9 F (36.6 C) (Oral)   Resp 18   Ht 1.651 m (5\' 5" )   Wt 86.6 kg   SpO2 96%   BMI 31.78 kg/m   Physical Exam Vitals and nursing note reviewed.  Constitutional:      Appearance: Normal appearance. She is well-developed.  HENT:     Head: Atraumatic.     Nose: Nose normal.     Mouth/Throat:     Mouth: Mucous membranes are moist.     Pharynx: Oropharynx is clear. No oropharyngeal exudate or posterior oropharyngeal erythema.  Eyes:     General: No scleral  icterus.    Conjunctiva/sclera: Conjunctivae normal.     Pupils: Pupils are equal, round, and reactive to light.  Neck:     Vascular: No carotid bruit.     Trachea: No tracheal deviation.     Comments: No stiffness or rigidity. Trachea midline. No neck mass. No l/a.  Cardiovascular:     Rate and Rhythm: Normal rate and regular rhythm.     Pulses: Normal pulses.     Heart sounds: Normal heart sounds. No murmur heard.   No friction rub. No gallop.  Pulmonary:  Effort: Pulmonary effort is normal. No respiratory distress.     Breath sounds: Normal breath sounds.  Abdominal:     General: Bowel sounds are normal. There is no distension.     Palpations: Abdomen is soft.     Tenderness: There is no abdominal tenderness. There is no guarding.  Genitourinary:    Comments: No cva tenderness.  Musculoskeletal:        General: No swelling or tenderness.     Cervical back: Normal range of motion and neck supple. No rigidity or tenderness. No muscular tenderness.     Right lower leg: No edema.     Left lower leg: No edema.  Skin:    General: Skin is warm and dry.     Findings: No rash.  Neurological:     Mental Status: She is alert.     Comments: Alert, speech normal. Motor/sens grossly intact bil.   Psychiatric:        Mood and Affect: Mood normal.    ED Results / Procedures / Treatments   Labs (all labs ordered are listed, but only abnormal results are displayed) Results for orders placed or performed during the hospital encounter of 23/30/07  Basic metabolic panel  Result Value Ref Range   Sodium 135 135 - 145 mmol/L   Potassium 3.6 3.5 - 5.1 mmol/L   Chloride 102 98 - 111 mmol/L   CO2 23 22 - 32 mmol/L   Glucose, Bld 150 (H) 70 - 99 mg/dL   BUN 15 8 - 23 mg/dL   Creatinine, Ser 0.89 0.44 - 1.00 mg/dL   Calcium 8.4 (L) 8.9 - 10.3 mg/dL   GFR, Estimated >60 >60 mL/min   Anion gap 10 5 - 15  CBC with Differential  Result Value Ref Range   WBC 11.6 (H) 4.0 - 10.5 K/uL   RBC  4.22 3.87 - 5.11 MIL/uL   Hemoglobin 12.9 12.0 - 15.0 g/dL   HCT 39.2 36.0 - 46.0 %   MCV 92.9 80.0 - 100.0 fL   MCH 30.6 26.0 - 34.0 pg   MCHC 32.9 30.0 - 36.0 g/dL   RDW 13.3 11.5 - 15.5 %   Platelets 182 150 - 400 K/uL   nRBC 0.0 0.0 - 0.2 %   Neutrophils Relative % 86 %   Neutro Abs 10.0 (H) 1.7 - 7.7 K/uL   Lymphocytes Relative 5 %   Lymphs Abs 0.6 (L) 0.7 - 4.0 K/uL   Monocytes Relative 9 %   Monocytes Absolute 1.1 (H) 0.1 - 1.0 K/uL   Eosinophils Relative 0 %   Eosinophils Absolute 0.0 0.0 - 0.5 K/uL   Basophils Relative 0 %   Basophils Absolute 0.0 0.0 - 0.1 K/uL   Immature Granulocytes 0 %   Abs Immature Granulocytes 0.05 0.00 - 0.07 K/uL  Urinalysis, Routine w reflex microscopic  Result Value Ref Range   Color, Urine YELLOW YELLOW   APPearance HAZY (A) CLEAR   Specific Gravity, Urine 1.019 1.005 - 1.030   pH 5.0 5.0 - 8.0   Glucose, UA NEGATIVE NEGATIVE mg/dL   Hgb urine dipstick NEGATIVE NEGATIVE   Bilirubin Urine NEGATIVE NEGATIVE   Ketones, ur NEGATIVE NEGATIVE mg/dL   Protein, ur 30 (A) NEGATIVE mg/dL   Nitrite NEGATIVE NEGATIVE   Leukocytes,Ua SMALL (A) NEGATIVE   RBC / HPF 0-5 0 - 5 RBC/hpf   WBC, UA 11-20 0 - 5 WBC/hpf   Bacteria, UA FEW (A) NONE SEEN   Squamous Epithelial /  LPF 0-5 0 - 5   Mucus PRESENT    Hyaline Casts, UA PRESENT    Non Squamous Epithelial 0-5 (A) NONE SEEN   CT Head Wo Contrast  Result Date: 01/03/2021 CLINICAL DATA:  Orthostatic with recent diagnosis of COVID positive. EXAM: CT HEAD WITHOUT CONTRAST TECHNIQUE: Contiguous axial images were obtained from the base of the skull through the vertex without intravenous contrast. COMPARISON:  None. FINDINGS: Brain: There is mild cerebral atrophy with widening of the extra-axial spaces and ventricular dilatation. There are areas of decreased attenuation within the white matter tracts of the supratentorial brain, consistent with microvascular disease changes. 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 Vascular: No hyperdense vessel or unexpected calcification. Skull: Normal. Negative for fracture or focal lesion. Sinuses/Orbits: There is moderate severity right ethmoid sinus Kozel thickening. A small air-fluid level is seen within the sphenoid sinus. Other: None. IMPRESSION: 1. Generalized cerebral atrophy. 2. No acute intracranial abnormality. Electronically Signed   By: Virgina Norfolk M.D.   On: 01/03/2021 01:21   DG Chest Port 1 View  Result Date: 01/03/2021 CLINICAL DATA:  75 year old female with cough and fever. EXAM: PORTABLE CHEST - 1 VIEW COMPARISON:  10/08/2018 FINDINGS: The mediastinal contours are within normal limits. No cardiomegaly. Atherosclerotic calcification of the aortic arch. Bibasilar subsegmental atelectasis. The lungs are otherwise clear bilaterally without evidence of focal consolidation, pleural effusion, or pneumothorax. No acute osseous abnormality. IMPRESSION: Bibasilar subsegmental atelectasis.  No focal consolidations. Aortic Atherosclerosis (ICD10-I70.0). Electronically Signed   By: Ruthann Cancer M.D.   On: 01/03/2021 08:43     EKG EKG Interpretation  Date/Time:  Tuesday January 03 2021 08:06:28 EST Ventricular Rate:  78 PR Interval:  124 QRS Duration: 101 QT Interval:  387 QTC Calculation: 441 R Axis:   -39 Text Interpretation: Sinus rhythm Left ventricular hypertrophy Nonspecific ST abnormality No previous tracing Confirmed by Lajean Saver [phone]) on 01/03/2021 8:20:19 AM  Radiology CT Head Wo Contrast  Result Date: 01/03/2021 CLINICAL DATA:  Orthostatic with recent diagnosis of COVID positive. EXAM: CT HEAD WITHOUT CONTRAST TECHNIQUE: Contiguous axial images were obtained from the base of the skull through the vertex without intravenous contrast. COMPARISON:  None. FINDINGS: Brain: There is mild cerebral atrophy with widening of the extra-axial spaces and ventricular dilatation. There are areas of decreased  attenuation within the white matter tracts of the supratentorial brain, consistent with microvascular disease changes. 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 Vascular: No hyperdense vessel or unexpected calcification. Skull: Normal. Negative for fracture or focal lesion. Sinuses/Orbits: There is moderate severity right ethmoid sinus Kozel thickening. A small air-fluid level is seen within the sphenoid sinus. Other: None. IMPRESSION: 1. Generalized cerebral atrophy. 2. No acute intracranial abnormality. Electronically Signed   By: Virgina Norfolk M.D.   On: 01/03/2021 01:21   DG Chest Port 1 View  Result Date: 01/03/2021 CLINICAL DATA:  75 year old female with cough and fever. EXAM: PORTABLE CHEST - 1 VIEW COMPARISON:  10/08/2018 FINDINGS: The mediastinal contours are within normal limits. No cardiomegaly. Atherosclerotic calcification of the aortic arch. Bibasilar subsegmental atelectasis. The lungs are otherwise clear bilaterally without evidence of focal consolidation, pleural effusion, or pneumothorax. No acute osseous abnormality. IMPRESSION: Bibasilar subsegmental atelectasis.  No focal consolidations. Aortic Atherosclerosis (ICD10-I70.0). Electronically Signed   By: Ruthann Cancer M.D.   On: 01/03/2021 08:43    Procedures Procedures   Medications Ordered in ED Medications  sodium chloride 0.9 % bolus 500 mL (500  mLs Intravenous New Bag/Given 01/03/21 0850)  cefTRIAXone (ROCEPHIN) 1 g in sodium chloride 0.9 % 100 mL IVPB (1 g Intravenous New Bag/Given 01/03/21 0856)    ED Course  I have reviewed the triage vital signs and the nursing notes.  Pertinent labs & imaging results that were available during my care of the patient were reviewed by me and considered in my medical decision making (see chart for details).    MDM Rules/Calculators/A&P                           Iv ns bolus. Stat labs. Imaging.   Reviewed nursing notes and prior charts for additional  history.   Labs reviewed/interpreted by me - chem normal. Ua with 11-20 wbc. Rocephin iv.   Po fluids/food.   CXR reviewed/interpreted by me - no pna.   Pt appears stable for d/c, is breathing comfortably, vitals normal.   Return precautions provided.      Final Clinical Impression(s) / ED Diagnoses Final diagnoses:  None    Rx / DC Orders ED Discharge Orders     None        Lajean Saver, MD 01/03/21 [phone]

## 2021-01-03 NOTE — ED Notes (Signed)
Pt was found on the floor by daughter who reports increased confusion, foul smelling urine, and thinks she may be dehydrated, she was given antiviral for covid by pcp on Saturday

## 2021-01-07 NOTE — Telephone Encounter (Signed)
Appt with Dr. Quay Burow 1/0/23.

## 2021-01-25 IMAGING — DX DG HIP (WITH OR WITHOUT PELVIS) 2-3V RIGHT
3 series · 3 of 3 positions shown · non-contrast
Comparison: None.

CLINICAL DATA: Groin pain 1 month.  No injury.

EXAM:
DG HIP (WITH OR WITHOUT PELVIS) 2-3V RIGHT

[pelvis ap]
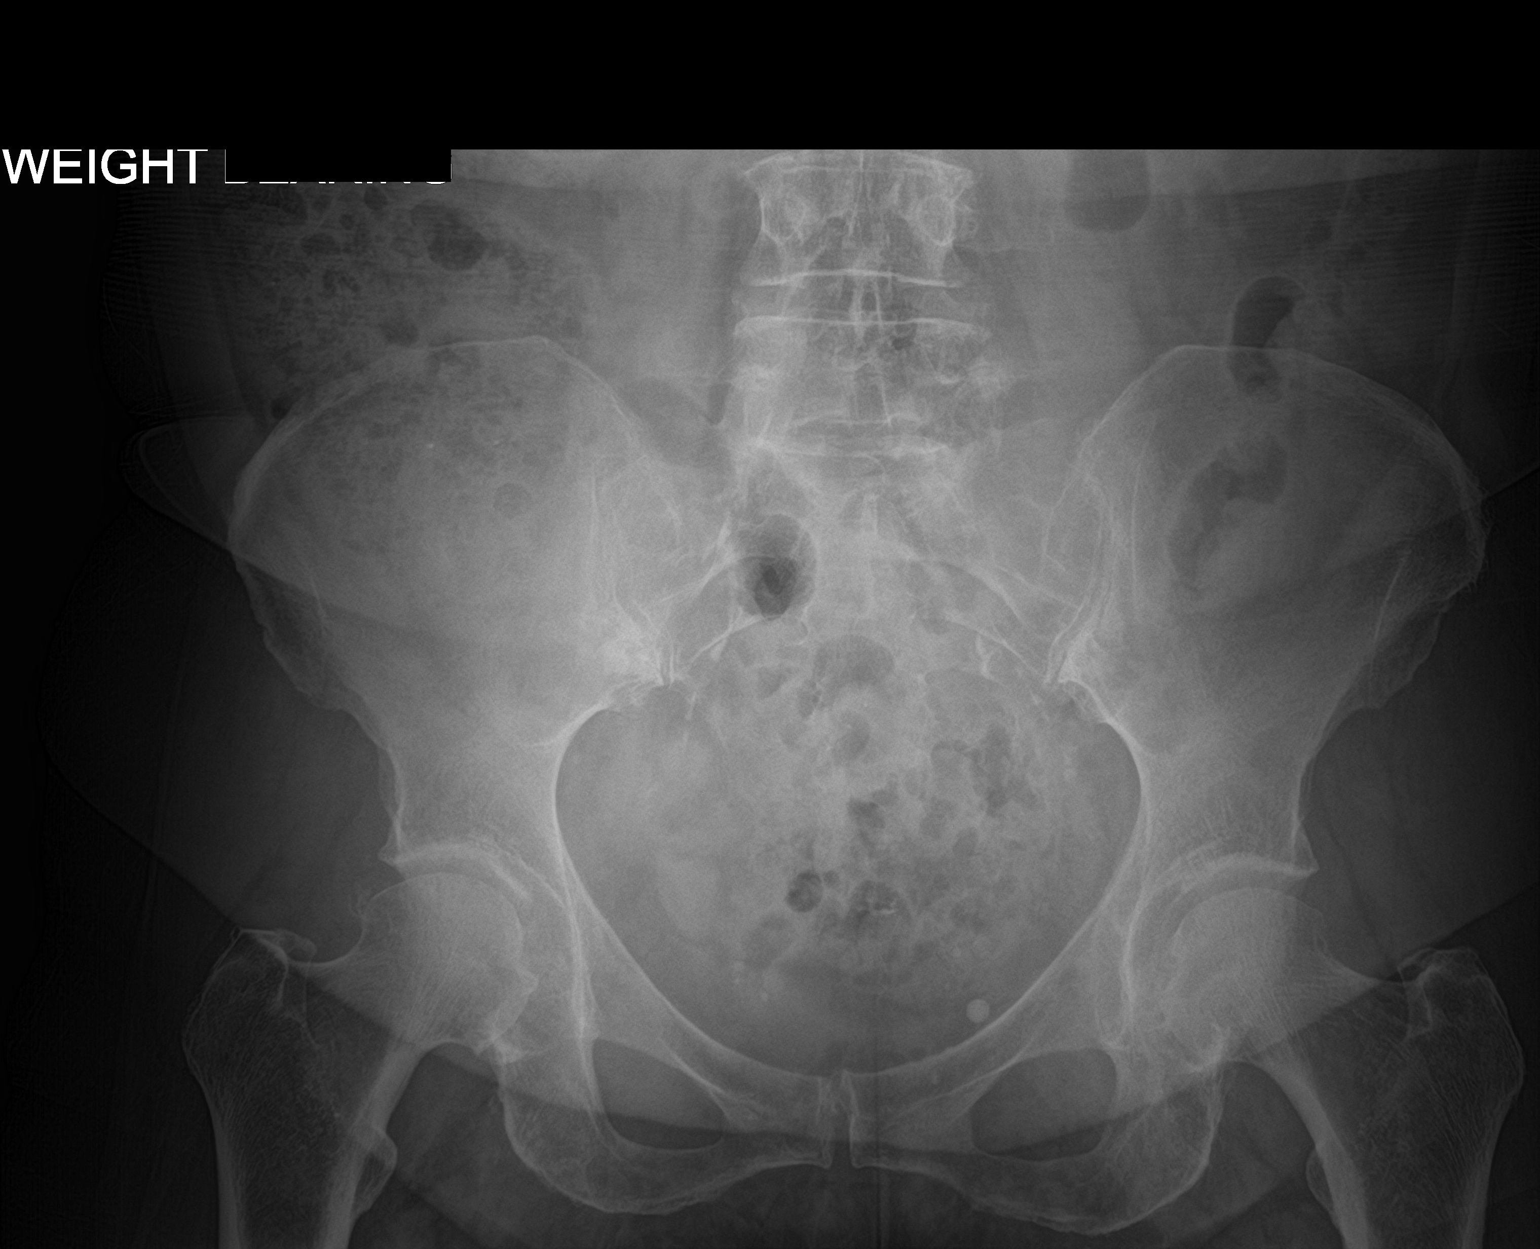

[hip ap]
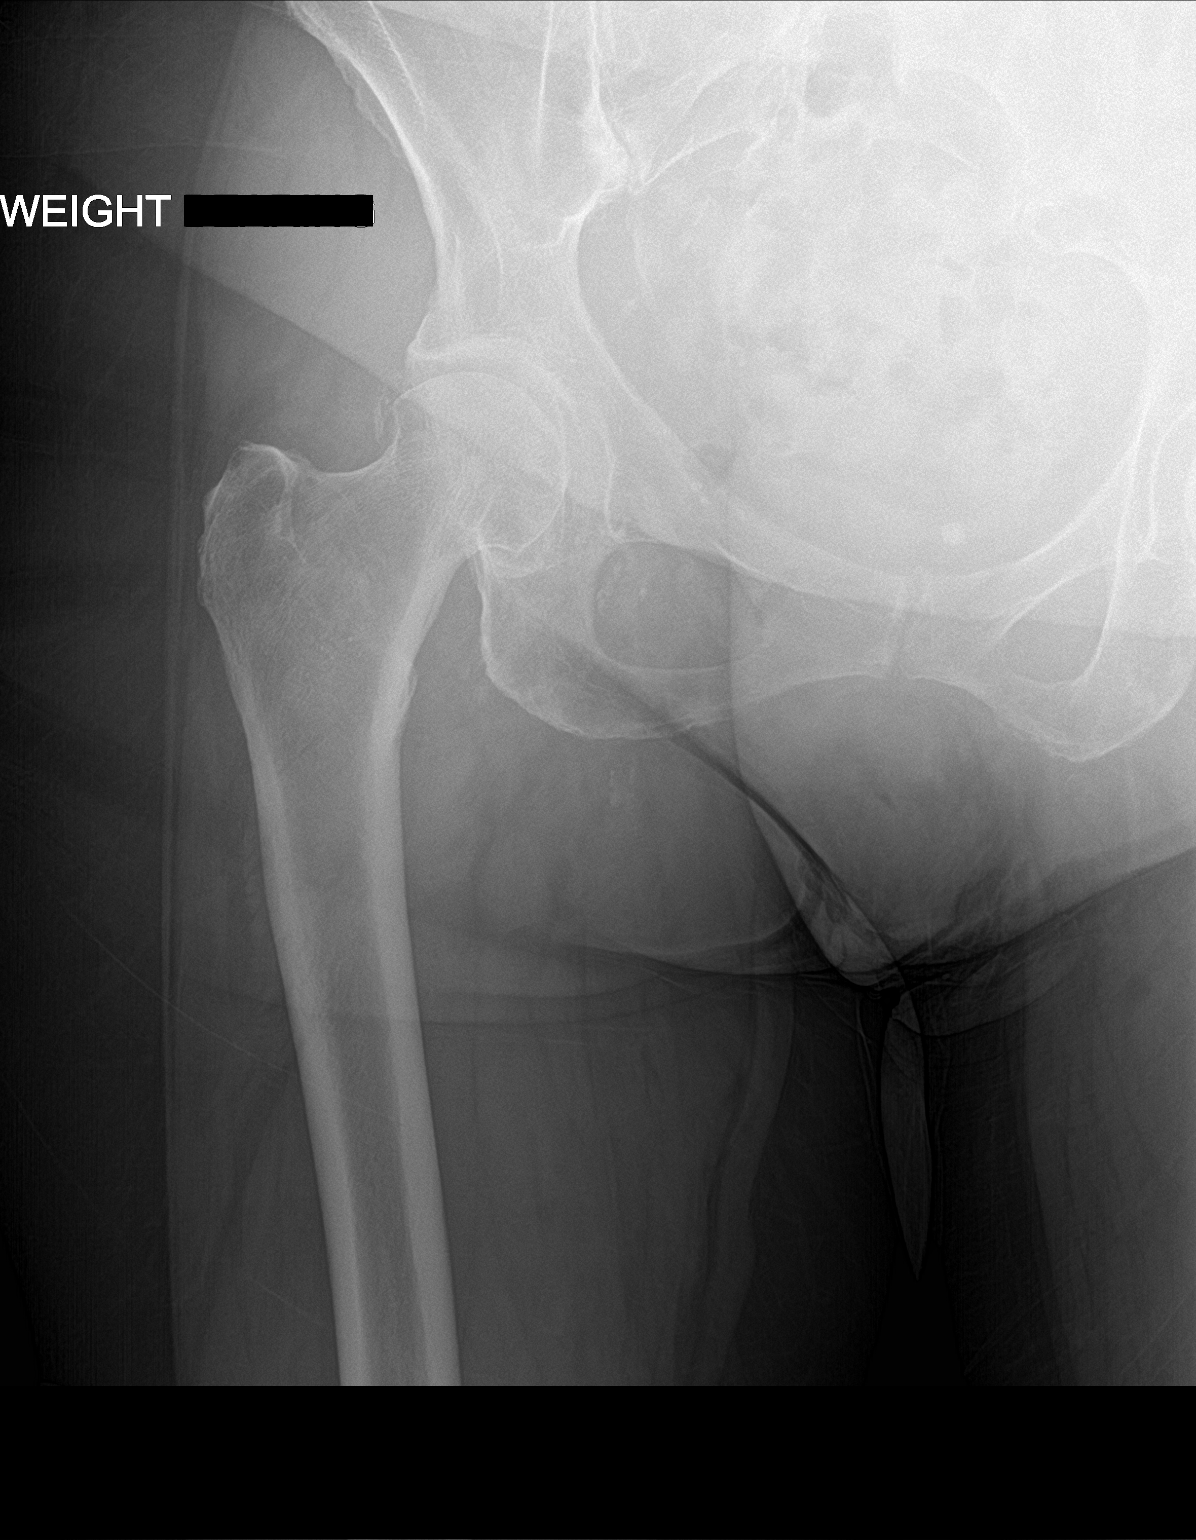

[hip lat]
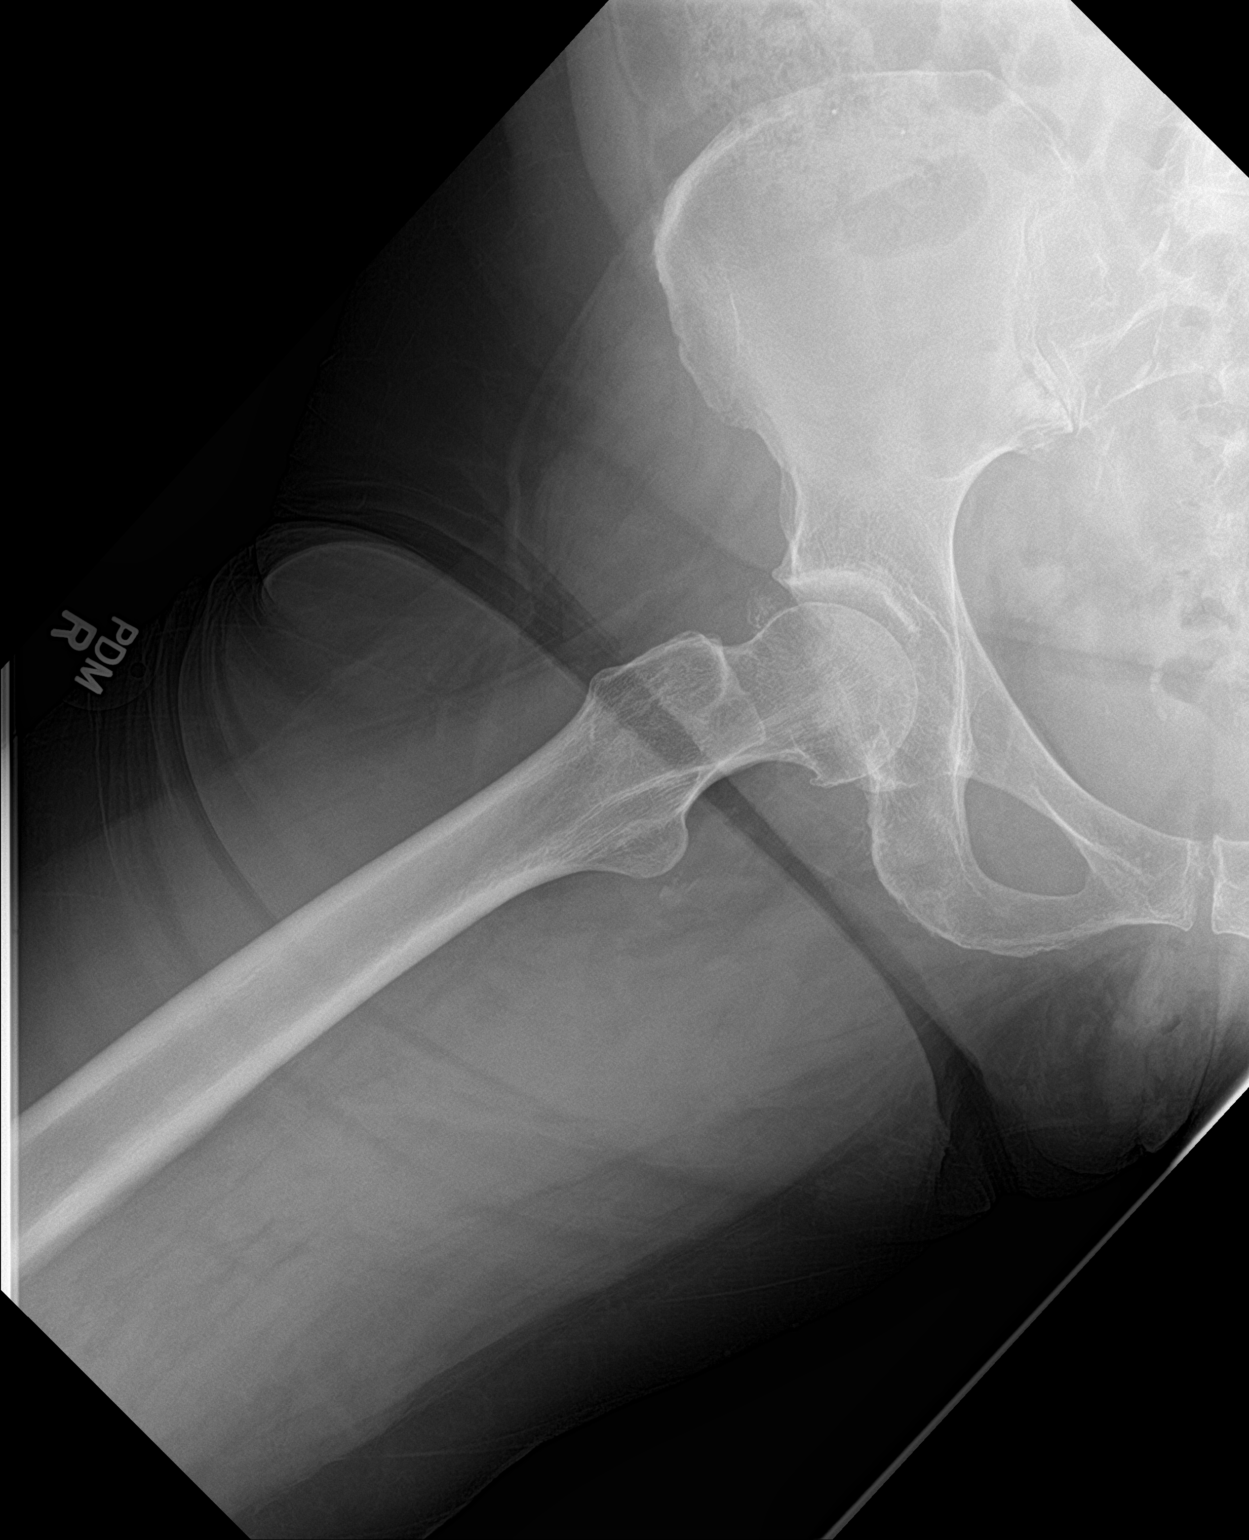

[3 of 3 positions shown; findings below may reference images not displayed]

FINDINGS: Mild diffuse decreased bone density. Mild bilateral degenerative
changes of the hips right worse than left. No acute fracture or
dislocation. Mild degenerate change over the sacroiliac joints and
spine. Few pelvic phleboliths are present.
IMPRESSION: No acute findings.

Mild osteoarthritis of the hips right worse than left.

## 2021-02-12 ENCOUNTER — Encounter: Payer: Self-pay | Admitting: Internal Medicine

## 2021-02-12 NOTE — Progress Notes (Signed)
Subjective:    Patient ID: Susan Holland, female    DOB: [birth year], 76 y.o.   MRN: [id number]   This visit occurred during the SARS-CoV-2 public health emergency.  Safety protocols were in place, including screening questions prior to the visit, additional usage of staff PPE, and extensive cleaning of exam room while observing appropriate contact time as indicated for disinfecting solutions.    HPI She is here for a physical exam.   She is not exercising regularly.   She has no energy or motivation.   She denies depression.    Medications and allergies reviewed with patient and updated if appropriate.  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      fluocinonide-emollient (LIDEX-E) 0.05 % cream Apply 1 application topically 2 (two) times daily. 60 g 1   Multiple Vitamin (MULTIVITAMIN WITH MINERALS) TABS tablet Take 1 tablet by mouth daily.     Omega-3 Fatty Acids (FISH OIL) 1200 MG CAPS Take 1,200 mg by mouth 2 (two) times daily.     No current facility-administered medications on file prior to visit.    Past Medical History:  Diagnosis Date   Colon polyp    Diverticulosis    Glaucoma suspect    Hyperlipidemia    Hypertension     Past Surgical History:  Procedure Laterality Date   CATARACT EXTRACTION Bilateral 2016    COLONOSCOPY W/ POLYPECTOMY  03/2011   repeat 2020; Dr Olevia Perches   DEEP LEG / ANKLE TUMOR EXCISION      X 2 RLE ; benign   TONSILLECTOMY AND ADENOIDECTOMY     TOTAL HIP ARTHROPLASTY Right 10/14/2018   Procedure: RIght Anterior Hip Arthroplasty;  Surgeon: Melrose Nakayama, MD;  Location: [facility];  Service: Orthopedics;  Laterality: Right;   TUBAL LIGATION      Social History   Socioeconomic History   Marital status: Widowed    Spouse name: Not on file   Number of children: 2   Years of education: Not on file   Highest education level: Not on file  Occupational History   Occupation: retired  Tobacco Use   Smoking status: Former    Types: Cigarettes    Quit date: 03/29/2015    Years since quitting: 5.8   Smokeless tobacco: Never   Tobacco comments:    smoked 1963- present, up to 1/2 ppd  Vaping Use   Vaping Use: Never used  Substance and Sexual Activity   Alcohol use: Yes    Alcohol/week: 0.0 standard drinks    Comment:  occasionally    Drug use: No   Sexual activity: Never  Other Topics Concern   Not on file  Social History Narrative   Exercise:  Does some walking, but irregularly   Social Determinants of Health   Financial Resource Strain: Low Risk    Difficulty of Paying Living Expenses: Not hard  at all  Food Insecurity: No Food Insecurity   Worried About Charity fundraiser in the Last Year: Never true   Ran Out of Food in the Last Year: Never true  Transportation Needs: No Transportation Needs   Lack of Transportation (Medical): No   Lack of Transportation (Non-Medical): No  Physical Activity: Inactive   Days of Exercise per Week: 0 days   Minutes of Exercise per Session: 0 min  Stress: No Stress Concern Present   Feeling of Stress : Not at all  Social Connections: Moderately Integrated   Frequency of Communication with Friends and Family: More than three times a week   Frequency of Social Gatherings with Friends and Family: More than three times a week   Attends  Religious Services: More than 4 times per year   Active Member of [employer] or Organizations: Yes   Attends Archivist Meetings: More than 4 times per year   Marital Status: Widowed    Family History  Problem Relation Age of Onset   Dementia Mother        Alsheimer's   Stroke Father 54   Heart disease Sister        AF   Colon cancer Maternal Uncle        X 2   Diabetes Neg Hx     Review of Systems  Constitutional:  Negative for chills and fever.  Eyes:  Negative for visual disturbance.  Respiratory:  Negative for cough, shortness of breath and wheezing.   Cardiovascular:  Negative for chest pain, palpitations and leg swelling.  Gastrointestinal:  Positive for constipation (chronic). Negative for abdominal pain, blood in stool, diarrhea and nausea.       No gerd  Genitourinary:  Negative for dysuria and hematuria.  Musculoskeletal:  Positive for back pain (occ center lower back). Negative for arthralgias.  Skin:  Negative for rash.  Neurological:  Negative for dizziness, light-headedness and headaches.  Psychiatric/Behavioral:  Negative for dysphoric mood. The patient is not nervous/anxious.        No motivation      Objective:   Vitals:   02/13/21 0940  BP: 126/70  Pulse: 85  Temp: 98.2 F (36.8 C)  SpO2: 96%   Filed Weights   02/13/21 0940  Weight: 185 lb (83.9 kg)   Body mass index is 30.79 kg/m.  BP Readings from Last 3 Encounters:  02/13/21 126/70  01/03/21 125/67  10/17/20 120/82    Wt Readings from Last 3 Encounters:  02/13/21 185 lb (83.9 kg)  01/03/21 191 lb (86.6 kg)  10/17/20 189 lb (85.7 kg)    Depression screen [facility] 2/9 10/17/2020 02/09/2020 08/15/2018 06/05/2017 05/17/2014  Decreased Interest 0 0 0 0 0  Down, Depressed, Hopeless 0 0 0 1 0  PHQ - 2 Score 0 0 0 1 0  Altered sleeping - - - 3 -  Tired, decreased energy - - - 1 -  Change in appetite - - - 0 -  Feeling bad or failure about yourself  - - - 0 -  Trouble concentrating - - - 0 -   Moving slowly or fidgety/restless - - - 0 -  Suicidal thoughts - - - 0 -  PHQ-9 Score - - - 5 -  Difficult doing work/chores - - - Not difficult at all -     No flowsheet data found.     Physical Exam Constitutional: She appears well-developed and well-nourished. No distress.  HENT:  Head: Normocephalic  and atraumatic.  Right Ear: External ear normal. Normal ear canal and TM Left Ear: External ear normal.  Normal ear canal and TM Mouth/Throat: Oropharynx is clear and moist.  Eyes: Conjunctivae and EOM are normal.  Neck: Neck supple. No tracheal deviation present. No thyromegaly present.  No carotid bruit  Cardiovascular: Normal rate, regular rhythm and normal heart sounds.   No murmur heard.  No edema. Pulmonary/Chest: Effort normal and breath sounds normal. No respiratory distress. She has no wheezes. She has no rales.  Breast: deferred   Abdominal: Soft. She exhibits no distension. There is no tenderness.  Lymphadenopathy: She has no cervical adenopathy.  Skin: Skin is warm and dry. She is not diaphoretic.  Psychiatric: She has a normal mood and affect. Her behavior is normal.   Diabetic Foot Exam - Simple   Simple Foot Form Diabetic Foot exam was performed with the following findings: Yes   Visual Inspection No deformities, no ulcerations, no other skin breakdown bilaterally: Yes Sensation Testing Intact to touch and monofilament testing bilaterally: Yes Pulse Check Posterior Tibialis and Dorsalis pulse intact bilaterally: Yes Comments      Lab Results  Component Value Date   WBC 11.6 (H) 01/02/2021   HGB 12.9 01/02/2021   HCT 39.2 01/02/2021   PLT 182 01/02/2021   GLUCOSE 150 (H) 01/02/2021   CHOL 156 08/12/2020   TRIG 101.0 08/12/2020   HDL 50.30 08/12/2020   LDLDIRECT 159.4 03/19/2011   LDLCALC 86 08/12/2020   ALT 17 08/12/2020   AST 18 08/12/2020   NA 135 01/02/2021   K 3.6 01/02/2021   CL 102 01/02/2021   CREATININE 0.89 01/02/2021   BUN 15  01/02/2021   CO2 23 01/02/2021   TSH 2.24 08/17/2019   INR 1.0 10/08/2018   HGBA1C 6.6 (H) 08/12/2020         Assessment & Plan:   Physical exam: Screening blood work  ordered Exercise  none Weight  encouraged weight loss Substance abuse  none   Reviewed recommended immunizations.   Health Maintenance  Topic Date Due   HEMOGLOBIN A1C  02/12/2021   COVID-19 Vaccine (4 - Booster for Pfizer series) 03/01/2021 (Originally 03/10/2020)   TETANUS/TDAP  08/12/2021 (Originally 03/17/2020)   COLONOSCOPY (Pts 45-72yrs Insurance coverage will need to be confirmed)  03/12/2021   OPHTHALMOLOGY EXAM  11/09/2021   FOOT EXAM  02/13/2022   DEXA SCAN  03/10/2022   Pneumonia Vaccine 10+ Years old  Completed   INFLUENZA VACCINE  Completed   Hepatitis C Screening  Completed   Zoster Vaccines- Shingrix  Completed   HPV VACCINES  Aged Out          See Problem List for Assessment and Plan of chronic medical problems.

## 2021-02-12 NOTE — Patient Instructions (Addendum)
Blood work was ordered.     Medications changes include :   None  Your prescription(s) have been submitted to your pharmacy. Please take as directed and contact our office if you believe you are having problem(s) with the medication(s).   Please followup in 6 months    Health Maintenance, Female Adopting a healthy lifestyle and getting preventive care are important in promoting health and wellness. Ask your health care provider about: The right schedule for you to have regular tests and exams. Things you can do on your own to prevent diseases and keep yourself healthy. What should I know about diet, weight, and exercise? Eat a healthy diet  Eat a diet that includes plenty of vegetables, fruits, low-fat dairy products, and lean protein. Do not eat a lot of foods that are high in solid fats, added sugars, or sodium. Maintain a healthy weight Body mass index (BMI) is used to identify weight problems. It estimates body fat based on height and weight. Your health care provider can help determine your BMI and help you achieve or maintain a healthy weight. Get regular exercise Get regular exercise. This is one of the most important things you can do for your health. Most adults should: Exercise for at least 150 minutes each week. The exercise should increase your heart rate and make you sweat (moderate-intensity exercise). Do strengthening exercises at least twice a week. This is in addition to the moderate-intensity exercise. Spend less time sitting. Even light physical activity can be beneficial. Watch cholesterol and blood lipids Have your blood tested for lipids and cholesterol at 76 years of age, then have this test every 5 years. Have your cholesterol levels checked more often if: Your lipid or cholesterol levels are high. You are older than 76 years of age. You are at high risk for heart disease. What should I know about cancer screening? Depending on your health history and  family history, you may need to have cancer screening at various ages. This may include screening for: Breast cancer. Cervical cancer. Colorectal cancer. Skin cancer. Lung cancer. What should I know about heart disease, diabetes, and high blood pressure? Blood pressure and heart disease High blood pressure causes heart disease and increases the risk of stroke. This is more likely to develop in people who have high blood pressure readings or are overweight. Have your blood pressure checked: Every 3-5 years if you are 10-10 years of age. Every year if you are 43 years old or older. Diabetes Have regular diabetes screenings. This checks your fasting blood sugar level. Have the screening done: Once every three years after age 75 if you are at a normal weight and have a low risk for diabetes. More often and at a younger age if you are overweight or have a high risk for diabetes. What should I know about preventing infection? Hepatitis B If you have a higher risk for hepatitis B, you should be screened for this virus. Talk with your health care provider to find out if you are at risk for hepatitis B infection. Hepatitis C Testing is recommended for: Everyone born from [birth year] through 1965. Anyone with known risk factors for hepatitis C. Sexually transmitted infections (STIs) Get screened for STIs, including gonorrhea and chlamydia, if: You are sexually active and are younger than 76 years of age. You are older than 76 years of age and your health care provider tells you that you are at risk for this type of infection. Your sexual activity has changed  since you were last screened, and you are at increased risk for chlamydia or gonorrhea. Ask your health care provider if you are at risk. Ask your health care provider about whether you are at high risk for HIV. Your health care provider may recommend a prescription medicine to help prevent HIV infection. If you choose to take medicine to prevent HIV,  you should first get tested for HIV. You should then be tested every 3 months for as long as you are taking the medicine. Pregnancy If you are about to stop having your period (premenopausal) and you may become pregnant, seek counseling before you get pregnant. Take 400 to 800 micrograms (mcg) of folic acid every day if you become pregnant. Ask for birth control (contraception) if you want to prevent pregnancy. Osteoporosis and menopause Osteoporosis is a disease in which the bones lose minerals and strength with aging. This can result in bone fractures. If you are 41 years old or older, or if you are at risk for osteoporosis and fractures, ask your health care provider if you should: Be screened for bone loss. Take a calcium or vitamin D supplement to lower your risk of fractures. Be given hormone replacement therapy (HRT) to treat symptoms of menopause. Follow these instructions at home: Alcohol use Do not drink alcohol if: Your health care provider tells you not to drink. You are pregnant, may be pregnant, or are planning to become pregnant. If you drink alcohol: Limit how much you have to: 0-1 drink a day. Know how much alcohol is in your drink. In the [place] one drink equals one 12 oz bottle of beer (355 mL), one 5 oz glass of wine (148 mL), or one 1 oz glass of hard liquor (44 mL). Lifestyle Do not use any products that contain nicotine or tobacco. These products include cigarettes, chewing tobacco, and vaping devices, such as e-cigarettes. If you need help quitting, ask your health care provider. Do not use street drugs. Do not share needles. Ask your health care provider for help if you need support or information about quitting drugs. General instructions Schedule regular health, dental, and eye exams. Stay current with your vaccines. Tell your health care provider if: You often feel depressed. You have ever been abused or do not feel safe at home. Summary Adopting a healthy  lifestyle and getting preventive care are important in promoting health and wellness. Follow your health care provider's instructions about healthy diet, exercising, and getting tested or screened for diseases. Follow your health care provider's instructions on monitoring your cholesterol and blood pressure. This information is not intended to replace advice given to you by your health care provider. Make sure you discuss any questions you have with your health care provider. Document Revised: 06/13/2020 Document Reviewed: 06/13/2020 Elsevier Patient Education  [employer].

## 2021-02-13 ENCOUNTER — Other Ambulatory Visit: Payer: Self-pay

## 2021-02-13 ENCOUNTER — Ambulatory Visit (INDEPENDENT_AMBULATORY_CARE_PROVIDER_SITE_OTHER): Payer: PPO | Admitting: Internal Medicine

## 2021-02-13 VITALS — BP 126/70 | HR 85 | Temp 98.2°F | Ht 65.0 in | Wt 185.0 lb

## 2021-02-13 DIAGNOSIS — Z0000 Encounter for general adult medical examination without abnormal findings: Secondary | ICD-10-CM | POA: Diagnosis not present

## 2021-02-13 DIAGNOSIS — M816 Localized osteoporosis [Lequesne]: Secondary | ICD-10-CM | POA: Diagnosis not present

## 2021-02-13 DIAGNOSIS — E782 Mixed hyperlipidemia: Secondary | ICD-10-CM

## 2021-02-13 DIAGNOSIS — E119 Type 2 diabetes mellitus without complications: Secondary | ICD-10-CM

## 2021-02-13 DIAGNOSIS — I10 Essential (primary) hypertension: Secondary | ICD-10-CM | POA: Diagnosis not present

## 2021-02-13 LAB — CBC WITH DIFFERENTIAL/PLATELET
Basophils Absolute: 0 10*3/uL (ref 0.0–0.1)
Basophils Relative: 0.7 % (ref 0.0–3.0)
Eosinophils Absolute: 0 10*3/uL (ref 0.0–0.7)
Eosinophils Relative: 0.7 % (ref 0.0–5.0)
HCT: 42.4 % (ref 36.0–46.0)
Hemoglobin: 13.9 g/dL (ref 12.0–15.0)
Lymphocytes Relative: 24.4 % (ref 12.0–46.0)
Lymphs Abs: 1.6 10*3/uL (ref 0.7–4.0)
MCHC: 32.8 g/dL (ref 30.0–36.0)
MCV: 93.4 fl (ref 78.0–100.0)
Monocytes Absolute: 0.6 10*3/uL (ref 0.1–1.0)
Monocytes Relative: 8.8 % (ref 3.0–12.0)
Neutro Abs: 4.3 10*3/uL (ref 1.4–7.7)
Neutrophils Relative %: 65.4 % (ref 43.0–77.0)
Platelets: 208 10*3/uL (ref 150.0–400.0)
RBC: 4.54 Mil/uL (ref 3.87–5.11)
RDW: 13.8 % (ref 11.5–15.5)
WBC: 6.6 10*3/uL (ref 4.0–10.5)

## 2021-02-13 LAB — COMPREHENSIVE METABOLIC PANEL
ALT: 25 U/L (ref 0–35)
AST: 24 U/L (ref 0–37)
Albumin: 4.4 g/dL (ref 3.5–5.2)
Alkaline Phosphatase: 52 U/L (ref 39–117)
BUN: 10 mg/dL (ref 6–23)
CO2: 32 mEq/L (ref 19–32)
Calcium: 10.5 mg/dL (ref 8.4–10.5)
Chloride: 98 mEq/L (ref 96–112)
Creatinine, Ser: 0.83 mg/dL (ref 0.40–1.20)
GFR: 68.74 mL/min (ref >60.00)
Glucose, Bld: 119 mg/dL — ABNORMAL HIGH (ref 70–99)
Potassium: 3.9 mEq/L (ref 3.5–5.1)
Sodium: 137 mEq/L (ref 135–145)
Total Bilirubin: 0.7 mg/dL (ref 0.2–1.2)
Total Protein: 7.5 g/dL (ref 6.0–8.3)

## 2021-02-13 LAB — LIPID PANEL
Cholesterol: 163 mg/dL (ref 0–200)
HDL: 53.5 mg/dL (ref >39.00)
LDL Cholesterol: 85 mg/dL (ref 0–99)
NonHDL: 109.04
Total CHOL/HDL Ratio: 3
Triglycerides: 119 mg/dL (ref 0.0–149.0)
VLDL: 23.8 mg/dL (ref 0.0–40.0)

## 2021-02-13 LAB — HEMOGLOBIN A1C: Hgb A1c MFr Bld: 6.6 % — ABNORMAL HIGH (ref 4.6–6.5)

## 2021-02-13 LAB — VITAMIN D 25 HYDROXY (VIT D DEFICIENCY, FRACTURES): VITD: 52.98 ng/mL (ref 30.00–100.00)

## 2021-02-13 LAB — TSH: TSH: 3 u[IU]/mL (ref 0.35–5.50)

## 2021-02-13 MED ORDER — ATORVASTATIN CALCIUM 20 MG PO TABS: 20.0000 mg | ORAL_TABLET | Freq: Every evening | ORAL | 1 refills | Status: DC

## 2021-02-13 MED ORDER — LOSARTAN POTASSIUM-HCTZ 100-12.5 MG PO TABS: 1.0000 | ORAL_TABLET | Freq: Every day | ORAL | 1 refills | Status: DC

## 2021-02-13 NOTE — Assessment & Plan Note (Signed)
Chronic Lab Results  Component Value Date   HGBA1C 6.6 (H) 08/12/2020   Sugars have been controlled Check A1c Encouraged diabetic diet, regular exercise

## 2021-02-13 NOTE — Assessment & Plan Note (Signed)
Chronic °Regular exercise and healthy diet encouraged °Check lipid panel  °Continue atorvastatin 20 mg daily °

## 2021-02-13 NOTE — Assessment & Plan Note (Signed)
Chronic Blood pressure well controlled CMP Continue Hyzaar 100-12.5 mg daily

## 2021-02-13 NOTE — Assessment & Plan Note (Addendum)
Chronic dexa up to date Continue calcium and vitamin d daily Stressed regular exercise Did prolia x 4 injections - could not afford so stopped Discussed fosamax and reclast

## 2021-04-04 NOTE — Telephone Encounter (Signed)
Pt ready for scheduling on or after 04/04/21  Out-of-pocket cost due at time of visit: $301  Primary: [employer] Adv [employer] co-insurance: 20% (approximately $276) Admin fee co-insurance: 20% (approximately $25)  Secondary: n/a Prolia co-insurance:  Admin fee co-insurance:   Deductible: does not apply  Prior Auth: not required PA# Valid:   ** This summary of benefits is an estimation of the patient's out-of-pocket cost. Exact cost may vary based on individual plan coverage.

## 2021-04-04 NOTE — Telephone Encounter (Signed)
Per visit note 02/13/21:  "Did prolia x 4 injections - could not afford so stopped Discussed fosamax and reclast:

## 2021-04-05 ENCOUNTER — Encounter: Payer: Self-pay | Admitting: Internal Medicine

## 2021-04-17 DIAGNOSIS — L728 Other follicular cysts of the skin and subcutaneous tissue: Secondary | ICD-10-CM | POA: Diagnosis not present

## 2021-04-21 ENCOUNTER — Encounter: Payer: Self-pay | Admitting: Internal Medicine

## 2021-04-28 ENCOUNTER — Ambulatory Visit (AMBULATORY_SURGERY_CENTER): Payer: PPO | Admitting: *Deleted

## 2021-04-28 ENCOUNTER — Other Ambulatory Visit: Payer: Self-pay

## 2021-04-28 VITALS — Ht 65.0 in | Wt 185.0 lb

## 2021-04-28 DIAGNOSIS — Z1211 Encounter for screening for malignant neoplasm of colon: Secondary | ICD-10-CM

## 2021-04-28 NOTE — Progress Notes (Signed)

## 2021-05-15 DIAGNOSIS — Z1231 Encounter for screening mammogram for malignant neoplasm of breast: Secondary | ICD-10-CM | POA: Diagnosis not present

## 2021-05-15 DIAGNOSIS — Z01419 Encounter for gynecological examination (general) (routine) without abnormal findings: Secondary | ICD-10-CM | POA: Diagnosis not present

## 2021-05-23 ENCOUNTER — Encounter: Payer: Self-pay | Admitting: Internal Medicine

## 2021-05-31 ENCOUNTER — Ambulatory Visit (AMBULATORY_SURGERY_CENTER): Payer: PPO | Admitting: Internal Medicine

## 2021-05-31 ENCOUNTER — Encounter: Payer: Self-pay | Admitting: Internal Medicine

## 2021-05-31 VITALS — BP 141/69 | HR 58 | Temp 97.8°F | Resp 12

## 2021-05-31 DIAGNOSIS — Z1211 Encounter for screening for malignant neoplasm of colon: Secondary | ICD-10-CM | POA: Diagnosis not present

## 2021-05-31 DIAGNOSIS — D122 Benign neoplasm of ascending colon: Secondary | ICD-10-CM

## 2021-05-31 DIAGNOSIS — D120 Benign neoplasm of cecum: Secondary | ICD-10-CM

## 2021-05-31 DIAGNOSIS — D127 Benign neoplasm of rectosigmoid junction: Secondary | ICD-10-CM | POA: Diagnosis not present

## 2021-05-31 DIAGNOSIS — D128 Benign neoplasm of rectum: Secondary | ICD-10-CM

## 2021-05-31 DIAGNOSIS — E785 Hyperlipidemia, unspecified: Secondary | ICD-10-CM | POA: Diagnosis not present

## 2021-05-31 DIAGNOSIS — D125 Benign neoplasm of sigmoid colon: Secondary | ICD-10-CM | POA: Diagnosis not present

## 2021-05-31 DIAGNOSIS — I10 Essential (primary) hypertension: Secondary | ICD-10-CM | POA: Diagnosis not present

## 2021-05-31 MED ORDER — SODIUM CHLORIDE 0.9 % IV SOLN: 500.0000 mL | Freq: Once | INTRAVENOUS | Status: DC

## 2021-05-31 NOTE — Progress Notes (Signed)
Called to room to assist during endoscopic procedure.  Patient ID and intended procedure confirmed with present staff. Received instructions for my participation in the procedure from the performing physician.  

## 2021-05-31 NOTE — Progress Notes (Signed)
? ?GASTROENTEROLOGY PROCEDURE H&P NOTE  ? ?Primary Care Physician: ?Binnie Rail, MD ? ? ? ?Reason for Procedure:   Colon cancer screening ? ?Plan:    Colonoscopy ? ?Patient is appropriate for endoscopic procedure(s) in the ambulatory ([facility]) setting. ? ?The nature of the procedure, as well as the risks, benefits, and alternatives were carefully and thoroughly reviewed with the patient. Ample time for discussion and questions allowed. The patient understood, was satisfied, and agreed to proceed.  ? ? ? ?HPI: ?Susan Holland is a 76 y.o. female who presents for colonoscopy for colon cancer screening. Denies blood in stools, changes in bowel habits, weight loss. She has uncles with colon cancer. ? ?Past Medical History:  ?Diagnosis Date  ? 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 Cataract   ? bilateral-removed  ? Colon polyp   ? Diverticulosis   ? Glaucoma suspect   ? Hyperlipidemia   ? Hypertension   ? Osteoporosis   ? "USE TO TAKE shots"  ? ? ?Past Surgical History:  ?Procedure Laterality Date  ? CATARACT EXTRACTION Bilateral 02/05/2014  ? COLONOSCOPY    ? COLONOSCOPY W/ POLYPECTOMY  03/09/2011  ? repeat 2020; Dr Olevia Perches  ? DEEP LEG / ANKLE TUMOR EXCISION    ?  X 2 RLE ; benign  ? TONSILLECTOMY AND ADENOIDECTOMY    ? TOTAL HIP ARTHROPLASTY Right 10/14/2018  ? Procedure: RIght Anterior Hip Arthroplasty;  Surgeon: Melrose Nakayama, MD;  Location: [facility];  Service: Orthopedics;  Laterality: Right;  ? TUBAL LIGATION    ? ? ?Prior to Admission medications   ?Medication Sig Start Date End Date Taking? Authorizing Provider  ?atorvastatin (LIPITOR) 20 MG tablet Take 1 tablet (20 mg total) by mouth every evening. 02/13/21  Yes Burns, Claudina Lick, MD  ?Calcium Carbonate-Vitamin D (CALCIUM + D PO) Take 1 tablet by mouth 2 (two) times daily.    Yes [provider]  ?losartan-hydrochlorothiazide (HYZAAR) 100-12.5 MG tablet Take 1 tablet by mouth daily. 02/13/21  Yes Burns, Claudina Lick, MD  ?Multiple Vitamin (MULTIVITAMIN WITH MINERALS) TABS  tablet Take 1 tablet by mouth daily.   Yes [provider]  ?Omega-3 Fatty Acids (FISH OIL) 1200 MG CAPS Take 1,200 mg by mouth 2 (two) times daily.   Yes [provider]  ?amoxicillin (AMOXIL) 500 MG tablet Take 2,000 mg by mouth once. ?Patient not taking: Reported on 04/28/2021 01/24/21   [provider]  ?fluocinonide-emollient (LIDEX-E) 0.05 % cream Apply 1 application topically 2 (two) times daily. ?Patient not taking: Reported on 04/28/2021 08/01/20   Janith Lima, MD  ? ? ?Current Outpatient Medications  ?Medication Sig Dispense Refill  ? atorvastatin (LIPITOR) 20 MG tablet Take 1 tablet (20 mg total) by mouth every evening. 90 tablet 1  ? Calcium Carbonate-Vitamin D (CALCIUM + D PO) Take 1 tablet by mouth 2 (two) times daily.     ? losartan-hydrochlorothiazide (HYZAAR) 100-12.5 MG tablet Take 1 tablet by mouth daily. 90 tablet 1  ? Multiple Vitamin (MULTIVITAMIN WITH MINERALS) TABS tablet Take 1 tablet by mouth daily.    ? Omega-3 Fatty Acids (FISH OIL) 1200 MG CAPS Take 1,200 mg by mouth 2 (two) times daily.    ? amoxicillin (AMOXIL) 500 MG tablet Take 2,000 mg by mouth once. (Patient not taking: Reported on 04/28/2021)    ? fluocinonide-emollient (LIDEX-E) 0.05 % cream Apply 1 application topically 2 (two) times daily. (Patient not taking: Reported on 04/28/2021) 60 g 1  ? ?Current  Facility-Administered Medications  ?Medication Dose Route Frequency Provider Last Rate Last Admin  ? 0.9 %  sodium chloride infusion  500 mL Intravenous Once Sharyn Creamer, MD      ? ? ?Allergies as of 05/31/2021  ? (No Known Allergies)  ? ? ?Family History  ?Problem Relation Age of Onset  ? Dementia Mother   ?     [place]'s  ? Stroke Father 32  ? Diabetes Sister   ? Heart disease Sister   ?     AF  ? Colon cancer Maternal Uncle   ?     X 2  ? Esophageal cancer Neg Hx   ? Rectal cancer Neg Hx   ? Stomach cancer Neg Hx   ? ? ?Social History  ? ?Socioeconomic History  ? Marital status: Widowed  ?   Spouse name: Not on file  ? Number of children: 2  ? Years of education: Not on file  ? Highest education level: Not on file  ?Occupational History  ? Occupation: retired  ?Tobacco Use  ? Smoking status: Former  ?  Types: Cigarettes  ?  Quit date: 03/29/2015  ?  Years since quitting: 6.1  ?  Passive exposure: Never  ? Smokeless tobacco: Never  ? Tobacco comments:  ?  smoked 1963- present, up to 1/2 ppd  ?Vaping Use  ? Vaping Use: Never used  ?Substance and Sexual Activity  ? Alcohol use: Yes  ?  Alcohol/week: 0.0 standard drinks  ?  Comment:  occasionally   ? Drug use: No  ? Sexual activity: Never  ?Other Topics Concern  ? Not on file  ?Social History Narrative  ? Exercise:  Does some walking, but irregularly  ? ?Social Determinants of Health  ? ?Financial Resource Strain: Low Risk   ? Difficulty of Paying Living Expenses: Not hard at all  ?Food Insecurity: No Food Insecurity  ? Worried About Charity fundraiser in the Last Year: Never true  ? Ran Out of Food in the Last Year: Never true  ?Transportation Needs: No Transportation Needs  ? Lack of Transportation (Medical): No  ? Lack of Transportation (Non-Medical): No  ?Physical Activity: Inactive  ? Days of Exercise per Week: 0 days  ? Minutes of Exercise per Session: 0 min  ?Stress: No Stress Concern Present  ? Feeling of Stress : Not at all  ?Social Connections: Moderately Integrated  ? Frequency of Communication with Friends and Family: More than three times a week  ? Frequency of Social Gatherings with Friends and Family: More than three times a week  ? Attends Religious Services: More than 4 times per year  ? Active Member of Clubs or Organizations: Yes  ? Attends Archivist Meetings: More than 4 times per year  ? Marital Status: Widowed  ?Intimate Partner Violence: Not At Risk  ? Fear of Current or Ex-Partner: No  ? Emotionally Abused: No  ? Physically Abused: No  ? Sexually Abused: No  ? ? ?Physical Exam: ?Vital signs in last 24 hours: ?BP (!) 149/79    Temp 97.8 ?F (36.6 ?C)   Resp 15   SpO2 95%  ?GEN: NAD ?EYE: Sclerae anicteric ?ENT: MMM ?CV: Non-tachycardic ?Pulm: No increased work of breathing ?GI: Soft, NT/ND ?NEURO:  Alert & Oriented ? ? ?Christia Reading, MD ?[facility] ? ?05/31/2021 8:08 AM ? ?

## 2021-05-31 NOTE — Patient Instructions (Signed)
Handouts given for polyps, diverticulosis and hemorrhoids.  Await pathology results.  YOU HAD AN ENDOSCOPIC PROCEDURE TODAY AT THE  ENDOSCOPY CENTER:   Refer to the procedure report that was given to you for any specific questions about what was found during the examination.  If the procedure report does not answer your questions, please call your gastroenterologist to clarify.  If you requested that your care partner not be given the details of your procedure findings, then the procedure report has been included in a sealed envelope for you to review at your convenience later.  YOU SHOULD EXPECT: Some feelings of bloating in the abdomen. Passage of more gas than usual.  Walking can help get rid of the air that was put into your GI tract during the procedure and reduce the bloating. If you had a lower endoscopy (such as a colonoscopy or flexible sigmoidoscopy) you may notice spotting of blood in your stool or on the toilet paper. If you underwent a bowel prep for your procedure, you may not have a normal bowel movement for a few days.  Please Note:  You might notice some irritation and congestion in your nose or some drainage.  This is from the oxygen used during your procedure.  There is no need for concern and it should clear up in a day or so.  SYMPTOMS TO REPORT IMMEDIATELY:   Following lower endoscopy (colonoscopy or flexible sigmoidoscopy):  Excessive amounts of blood in the stool  Significant tenderness or worsening of abdominal pains  Swelling of the abdomen that is new, acute  Fever of 100F or higher   For urgent or emergent issues, a gastroenterologist can be reached at any hour by calling [phone]. Do not use MyChart messaging for urgent concerns.    DIET:  We do recommend a small meal at first, but then you may proceed to your regular diet.  Drink plenty of fluids but you should avoid alcoholic beverages for 24 hours.  ACTIVITY:  You should plan to take it easy for  the rest of today and you should NOT DRIVE or use heavy machinery until tomorrow (because of the sedation medicines used during the test).    FOLLOW UP: Our staff will call the number listed on your records 48-72 hours following your procedure to check on you and address any questions or concerns that you may have regarding the information given to you following your procedure. If we do not reach you, we will leave a message.  We will attempt to reach you two times.  During this call, we will ask if you have developed any symptoms of COVID 19. If you develop any symptoms (ie: fever, flu-like symptoms, shortness of breath, cough etc.) before then, please call [phone].  If you test positive for Covid 19 in the 2 weeks post procedure, please call and report this information to us.    If any biopsies were taken you will be contacted by phone or by letter within the next 1-3 weeks.  Please call us at [phone] if you have not heard about the biopsies in 3 weeks.    SIGNATURES/CONFIDENTIALITY: You and/or your care partner have signed paperwork which will be entered into your electronic medical record.  These signatures attest to the fact that that the information above on your After Visit Summary has been reviewed and is understood.  Full responsibility of the confidentiality of this discharge information lies with you and/or your care-partner. 

## 2021-05-31 NOTE — Op Note (Signed)
[place] ?Patient Name: Susan Holland ?Procedure Date: 05/31/2021 8:06 AM ?MRN: [id number] ?Endoscopist: Sonny Masters "Christia Reading ,  ?Age: 76 ?Referring MD:  ?Date of Birth: 1945/12/29 ?Gender: Female ?Account #: [id number] ?Procedure:                Colonoscopy ?Indications:              Screening for colorectal malignant neoplasm ?Medicines:                Monitored Anesthesia Care ?Procedure:                Pre-Anesthesia Assessment: ?                          - Prior to the procedure, a History and Physical  ?                          was performed, and patient medications and  ?                          allergies were reviewed. The patient's tolerance of  ?                          previous anesthesia was also reviewed. The risks  ?                          and benefits of the procedure and the sedation  ?                          options and risks were discussed with the patient.  ?                          All questions were answered, and informed consent  ?                          was obtained. Prior Anticoagulants: The patient has  ?                          taken no previous anticoagulant or antiplatelet  ?                          agents. ASA Grade Assessment: II - A patient with  ?                          mild systemic disease. After reviewing the risks  ?                          and benefits, the patient was deemed in  ?                          satisfactory condition to undergo the procedure. ?                          After obtaining informed consent, the colonoscope  ?  was passed under direct vision. Throughout the  ?                          procedure, the patient's blood pressure, pulse, and  ?                          oxygen saturations were monitored continuously. The  ?                          Olympus CF-HQ190L (Serial# [id number]) Colonoscope was  ?                          introduced through the anus and advanced to the the  ?                          cecum,  identified by appendiceal orifice and  ?                          ileocecal valve. The colonoscopy was performed  ?                          without difficulty. The patient tolerated the  ?                          procedure well. The quality of the bowel  ?                          preparation was adequate. The ileocecal valve,  ?                          appendiceal orifice, and rectum were photographed. ?Scope In: 8:11:05 AM ?Scope Out: 8:49:20 AM ?Scope Withdrawal Time: 0 hours 25 minutes 46 seconds  ?Total Procedure Duration: 0 hours 38 minutes 15 seconds  ?Findings:                 Multiple small and large-mouthed diverticula were  ?                          found in the entire colon. ?                          Three sessile polyps were found in the ascending  ?                          colon and cecum. The polyps were 3 to 4 mm in size.  ?                          These polyps were removed with a cold snare.  ?                          Resection and retrieval were complete. ?                          Five sessile polyps were found in the rectum  and  ?                          sigmoid colon. The polyps were 3 to 6 mm in size.  ?                          These polyps were removed with a cold snare.  ?                          Resection and retrieval were complete. ?                          Non-bleeding internal hemorrhoids were found during  ?                          retroflexion. ?Complications:            No immediate complications. ?Estimated Blood Loss:     Estimated blood loss was minimal. ?Impression:               - Diverticulosis in the entire examined colon. ?                          - Three 3 to 4 mm polyps in the ascending colon and  ?                          in the cecum, removed with a cold snare. Resected  ?                          and retrieved. ?                          - Five 3 to 6 mm polyps in the rectum and in the  ?                          sigmoid colon, removed with a cold snare.  Resected  ?                          and retrieved. ?                          - Non-bleeding internal hemorrhoids. ?Recommendation:           - Discharge patient to home (with escort). ?                          - Await pathology results. ?                          - The findings and recommendations were discussed  ?                          with the patient. ?Georgian Co,  ?05/31/2021 8:57:35 AM ?

## 2021-05-31 NOTE — Progress Notes (Signed)
To Pacu, VSS. Report to [email or web address] 

## 2021-05-31 NOTE — Progress Notes (Signed)
Pt's states no medical or surgical changes since previsit or office visit. 

## 2021-06-02 ENCOUNTER — Telehealth: Payer: Self-pay

## 2021-06-02 NOTE — Telephone Encounter (Signed)
?  Follow up Call- ? ? ?  05/31/2021  ?  7:51 AM  ?Call back number  ?Post procedure Call Back phone  # [phone]  ?Permission to leave phone message Yes  ?  ? ?Patient questions: ? ?Do you have a fever, pain , or abdominal swelling? No. ?Pain Score  0 * ? ?Have you tolerated food without any problems? Yes.   ? ?Have you been able to return to your normal activities? Yes.   ? ?Do you have any questions about your discharge instructions: ?Diet   No. ?Medications  No. ?Follow up visit  No. ? ?Do you have questions or concerns about your Care? No. ? ?Actions: ?* If pain score is 4 or above: ?No action needed, pain <4. ? ? ?

## 2021-06-02 NOTE — Telephone Encounter (Signed)
First post procedure follow up cal, no answer ?

## 2021-06-05 ENCOUNTER — Encounter: Payer: Self-pay | Admitting: Internal Medicine

## 2021-06-13 DIAGNOSIS — L02212 Cutaneous abscess of back [any part, except buttock]: Secondary | ICD-10-CM | POA: Diagnosis not present

## 2021-06-13 DIAGNOSIS — L570 Actinic keratosis: Secondary | ICD-10-CM | POA: Diagnosis not present

## 2021-06-13 DIAGNOSIS — L814 Other melanin hyperpigmentation: Secondary | ICD-10-CM | POA: Diagnosis not present

## 2021-06-13 DIAGNOSIS — Z08 Encounter for follow-up examination after completed treatment for malignant neoplasm: Secondary | ICD-10-CM | POA: Diagnosis not present

## 2021-06-13 DIAGNOSIS — D225 Melanocytic nevi of trunk: Secondary | ICD-10-CM | POA: Diagnosis not present

## 2021-06-13 DIAGNOSIS — Z85820 Personal history of malignant melanoma of skin: Secondary | ICD-10-CM | POA: Diagnosis not present

## 2021-06-13 DIAGNOSIS — L821 Other seborrheic keratosis: Secondary | ICD-10-CM | POA: Diagnosis not present

## 2021-06-30 DIAGNOSIS — L720 Epidermal cyst: Secondary | ICD-10-CM | POA: Diagnosis not present

## 2021-07-20 DIAGNOSIS — L298 Other pruritus: Secondary | ICD-10-CM | POA: Diagnosis not present

## 2021-07-20 DIAGNOSIS — L0101 Non-bullous impetigo: Secondary | ICD-10-CM | POA: Diagnosis not present

## 2021-07-20 DIAGNOSIS — L237 Allergic contact dermatitis due to plants, except food: Secondary | ICD-10-CM | POA: Diagnosis not present

## 2021-08-01 ENCOUNTER — Other Ambulatory Visit: Payer: Self-pay | Admitting: Surgery

## 2021-08-01 DIAGNOSIS — L720 Epidermal cyst: Secondary | ICD-10-CM | POA: Diagnosis not present

## 2021-08-22 NOTE — Patient Instructions (Addendum)
     Blood work was ordered.     Medications changes include :      Your prescription(s) have been sent to your pharmacy.    A referral was ordered for XX.     Someone from that office will call you to schedule an appointment.    Return in about 6 months (around 02/23/2022) for Physical Exam.

## 2021-08-22 NOTE — Progress Notes (Unsigned)
Subjective:    Patient ID: Susan Holland, female    DOB: 03-31-1945, 76 y.o.   MRN: [id number]     HPI Susan Holland is here for follow up of her chronic medical problems, including htn, DM, hld, OP   She would be interested in reclast  Not exercising.  Not motivated to do much.  Denies depression.    Medications and allergies reviewed with patient and updated if appropriate.  Current Outpatient Medications on File Prior to Visit  Medication Sig Dispense Refill   amoxicillin (AMOXIL) 500 MG tablet Take 2,000 mg by mouth once. (Patient not taking: Reported on 04/28/2021)     atorvastatin (LIPITOR) 20 MG tablet Take 1 tablet (20 mg total) by mouth every evening. 90 tablet 1   Calcium Carbonate-Vitamin D (CALCIUM + D PO) Take 1 tablet by mouth 2 (two) times daily.      fluocinonide-emollient (LIDEX-E) 0.05 % cream Apply 1 application topically 2 (two) times daily. (Patient not taking: Reported on 04/28/2021) 60 g 1   losartan-hydrochlorothiazide (HYZAAR) 100-12.5 MG tablet Take 1 tablet by mouth daily. 90 tablet 1   Multiple Vitamin (MULTIVITAMIN WITH MINERALS) TABS tablet Take 1 tablet by mouth daily.     Omega-3 Fatty Acids (FISH OIL) 1200 MG CAPS Take 1,200 mg by mouth 2 (two) times daily.     No current facility-administered medications on file prior to visit.     Review of Systems  Constitutional:  Negative for chills and fever.  Respiratory:  Negative for cough, shortness of breath and wheezing.   Cardiovascular:  Negative for chest pain, palpitations and leg swelling.  Neurological:  Positive for dizziness (with certain head movements). Negative for light-headedness and headaches.       Objective:  There were no vitals filed for this visit. BP Readings from Last 3 Encounters:  05/31/21 (!) 141/69  02/13/21 126/70  01/03/21 125/67   Wt Readings from Last 3 Encounters:  04/28/21 185 lb (83.9 kg)  02/13/21 185 lb (83.9 kg)  01/03/21 191 lb (86.6 kg)   There is no  height or weight on file to calculate BMI.    Physical Exam Constitutional:      General: She is not in acute distress.    Appearance: Normal appearance.  HENT:     Head: Normocephalic and atraumatic.  Eyes:     Conjunctiva/sclera: Conjunctivae normal.  Cardiovascular:     Rate and Rhythm: Normal rate and regular rhythm.     Heart sounds: Normal heart sounds. No murmur heard. Pulmonary:     Effort: Pulmonary effort is normal. No respiratory distress.     Breath sounds: Normal breath sounds. No wheezing.  Musculoskeletal:     Cervical back: Neck supple.     Right lower leg: No edema.     Left lower leg: No edema.  Lymphadenopathy:     Cervical: No cervical adenopathy.  Skin:    General: Skin is warm and dry.     Findings: No rash.  Neurological:     Mental Status: She is alert. Mental status is at baseline.  Psychiatric:        Mood and Affect: Mood normal.        Behavior: Behavior normal.        Lab Results  Component Value Date   WBC 6.6 02/13/2021   HGB 13.9 02/13/2021   HCT 42.4 02/13/2021   PLT 208.0 02/13/2021   GLUCOSE 119 (H) 02/13/2021   CHOL  163 02/13/2021   TRIG 119.0 02/13/2021   HDL 53.50 02/13/2021   LDLDIRECT 159.4 03/19/2011   LDLCALC 85 02/13/2021   ALT 25 02/13/2021   AST 24 02/13/2021   NA 137 02/13/2021   K 3.9 02/13/2021   CL 98 02/13/2021   CREATININE 0.83 02/13/2021   BUN 10 02/13/2021   CO2 32 02/13/2021   TSH 3.00 02/13/2021   INR 1.0 10/08/2018   HGBA1C 6.6 (H) 02/13/2021     Assessment & Plan:    See Problem List for Assessment and Plan of chronic medical problems.

## 2021-08-23 ENCOUNTER — Encounter: Payer: Self-pay | Admitting: Internal Medicine

## 2021-08-23 ENCOUNTER — Ambulatory Visit (INDEPENDENT_AMBULATORY_CARE_PROVIDER_SITE_OTHER): Payer: PPO | Admitting: Internal Medicine

## 2021-08-23 VITALS — BP 130/70 | HR 68 | Temp 98.0°F | Ht 65.0 in

## 2021-08-23 DIAGNOSIS — I10 Essential (primary) hypertension: Secondary | ICD-10-CM

## 2021-08-23 DIAGNOSIS — M816 Localized osteoporosis [Lequesne]: Secondary | ICD-10-CM | POA: Diagnosis not present

## 2021-08-23 DIAGNOSIS — E782 Mixed hyperlipidemia: Secondary | ICD-10-CM | POA: Diagnosis not present

## 2021-08-23 DIAGNOSIS — E119 Type 2 diabetes mellitus without complications: Secondary | ICD-10-CM

## 2021-08-23 LAB — COMPREHENSIVE METABOLIC PANEL
ALT: 19 U/L (ref 0–35)
AST: 21 U/L (ref 0–37)
Albumin: 4.6 g/dL (ref 3.5–5.2)
Alkaline Phosphatase: 56 U/L (ref 39–117)
BUN: 17 mg/dL (ref 6–23)
CO2: 31 mEq/L (ref 19–32)
Calcium: 10.8 mg/dL — ABNORMAL HIGH (ref 8.4–10.5)
Chloride: 97 mEq/L (ref 96–112)
Creatinine, Ser: 0.82 mg/dL (ref 0.40–1.20)
GFR: 69.5 mL/min (ref >60.00)
Glucose, Bld: 102 mg/dL — ABNORMAL HIGH (ref 70–99)
Potassium: 3.9 mEq/L (ref 3.5–5.1)
Sodium: 135 mEq/L (ref 135–145)
Total Bilirubin: 0.6 mg/dL (ref 0.2–1.2)
Total Protein: 7.5 g/dL (ref 6.0–8.3)

## 2021-08-23 LAB — VITAMIN D 25 HYDROXY (VIT D DEFICIENCY, FRACTURES): VITD: 51.17 ng/mL (ref 30.00–100.00)

## 2021-08-23 LAB — LIPID PANEL
Cholesterol: 155 mg/dL (ref 0–200)
HDL: 52.7 mg/dL (ref >39.00)
LDL Cholesterol: 78 mg/dL (ref 0–99)
NonHDL: 102.38
Total CHOL/HDL Ratio: 3
Triglycerides: 120 mg/dL (ref 0.0–149.0)
VLDL: 24 mg/dL (ref 0.0–40.0)

## 2021-08-23 LAB — HEMOGLOBIN A1C: Hgb A1c MFr Bld: 6.6 % — ABNORMAL HIGH (ref 4.6–6.5)

## 2021-08-23 NOTE — Assessment & Plan Note (Addendum)
Chronic She did Prolia for 2 years, but stopped secondary to co-pay being too expensive Would consider reclast injections - does not want to do any pills Stressed calcium, vitamin D daily which she is doing Stressed regular exercise Discussed DEXA next year

## 2021-08-23 NOTE — Assessment & Plan Note (Signed)
Chronic °Blood pressure well controlled °CMP °Continue losartan-HCTZ 100-12.5 mg daily °

## 2021-08-23 NOTE — Assessment & Plan Note (Signed)
Chronic °Regular exercise and healthy diet encouraged °Check lipid panel  °Continue atorvastatin 20 mg daily °

## 2021-08-23 NOTE — Assessment & Plan Note (Signed)
Chronic  Lab Results  Component Value Date   HGBA1C 6.6 (H) 02/13/2021   Sugars well controlled Testing sugars 0 times a day Check A1c, urine microalbumin today Continue lifestyle control Stressed regular exercise, diabetic diet

## 2021-08-29 ENCOUNTER — Telehealth: Payer: Self-pay | Admitting: Internal Medicine

## 2021-08-29 MED ORDER — LOSARTAN POTASSIUM-HCTZ 100-12.5 MG PO TABS: 1.0000 | ORAL_TABLET | Freq: Every day | ORAL | 3 refills | Status: DC

## 2021-08-29 MED ORDER — ATORVASTATIN CALCIUM 20 MG PO TABS: 20.0000 mg | ORAL_TABLET | Freq: Every evening | ORAL | 3 refills | Status: DC

## 2021-08-29 NOTE — Telephone Encounter (Signed)
Pt up-to-date sent rx to mail order.Marland KitchenJohny Holland

## 2021-08-29 NOTE — Telephone Encounter (Signed)
Patient needs her atorvastatin and her losartan refilled - Please send to [facility]  Last visit:  08/23/2021  Next visit:  02/23/2022  Patient has enough medication  until Friday

## 2021-10-18 ENCOUNTER — Ambulatory Visit (INDEPENDENT_AMBULATORY_CARE_PROVIDER_SITE_OTHER): Payer: PPO

## 2021-10-18 VITALS — Ht 65.0 in | Wt 185.0 lb

## 2021-10-18 DIAGNOSIS — Z0000 Encounter for general adult medical examination without abnormal findings: Secondary | ICD-10-CM

## 2021-10-18 NOTE — Patient Instructions (Signed)
[name] , Thank you for taking time to come for your Medicare Wellness Visit. I appreciate your ongoing commitment to your health goals. Please review the following plan we discussed and let me know if I can assist you in the future.   Screening recommendations/referrals: Colonoscopy: 05/31/2021; due every 3 years Mammogram: 04/2021; due every year Bone Density: 03/10/2020; due every 2 years Recommended yearly ophthalmology/optometry visit for glaucoma screening and checkup Recommended yearly dental visit for hygiene and checkup  Vaccinations: Influenza vaccine: due Fall 2023 Pneumococcal vaccine: 03/09/2011, 10/05/2014 Tdap vaccine: 03/17/2010; due every 10 years; (overdue) Shingles vaccine: 06/10/2017, 09/05/2017   Covid-19: 03/21/2019, 04/15/2019, 01/14/2020  Advanced directives: Yes; Please bring a copy of your health care power of attorney and living will to the office at your convenience.  Conditions/risks identified: Yes; Client understands the importance of follow-up appointments with providers by attending scheduled visits and discussed goals to eat healthier, increase physical activity 5 times a week for 30 minutes each, exercise the brain by doing stimulating brain exercises (reading, adult coloring, crafting, listening to music, puzzles, etc.), socialize and enjoy life more, get enough sleep at least 8-9 hours average per night and make time for laughter.  Next appointment: 10/22/2022 at 9:45 a.m. telephone visit with Susan Holland, Nurse Health Advisor.  If you need to reschedule or cancel, please call [phone].   Preventive Care 76 Years and Older, Female Preventive care refers to lifestyle choices and visits with your health care provider that can promote health and wellness. What does preventive care include? A yearly physical exam. This is also called an annual well check. Dental exams once or twice a year. Routine eye exams. Ask your health care provider how often you should have  your eyes checked. Personal lifestyle choices, including: Daily care of your teeth and gums. Regular physical activity. Eating a healthy diet. Avoiding tobacco and drug use. Limiting alcohol use. Practicing safe sex. Taking low-dose aspirin every day. Taking vitamin and mineral supplements as recommended by your health care provider. What happens during an annual well check? The services and screenings done by your health care provider during your annual well check will depend on your age, overall health, lifestyle risk factors, and family history of disease. Counseling  Your health care provider may ask you questions about your: Alcohol use. Tobacco use. Drug use. Emotional well-being. Home and relationship well-being. Sexual activity. Eating habits. History of falls. Memory and ability to understand (cognition). Work and work Statistician. Reproductive health. Screening  You may have the following tests or measurements: Height, weight, and BMI. Blood pressure. Lipid and cholesterol levels. These may be checked every 5 years, or more frequently if you are over 38 years old. Skin check. Lung cancer screening. You may have this screening every year starting at age 76 if you have a 30-pack-year history of smoking and currently smoke or have quit within the past 15 years. Fecal occult blood test (FOBT) of the stool. You may have this test every year starting at age 17. Flexible sigmoidoscopy or colonoscopy. You may have a sigmoidoscopy every 5 years or a colonoscopy every 10 years starting at age 76. Hepatitis C blood test. Hepatitis B blood test. Sexually transmitted disease (STD) testing. Diabetes screening. This is done by checking your blood sugar (glucose) after you have not eaten for a while (fasting). You may have this done every 1-3 years. Bone density scan. This is done to screen for osteoporosis. You may have this done starting at age 20. Mammogram.  This may be done every  1-2 years. Talk to your health care provider about how often you should have regular mammograms. Talk with your health care provider about your test results, treatment options, and if necessary, the need for more tests. Vaccines  Your health care provider may recommend certain vaccines, such as: Influenza vaccine. This is recommended every year. Tetanus, diphtheria, and acellular pertussis (Tdap, Td) vaccine. You may need a Td booster every 10 years. Zoster vaccine. You may need this after age 12. Pneumococcal 13-valent conjugate (PCV13) vaccine. One dose is recommended after age 4. Pneumococcal polysaccharide (PPSV23) vaccine. One dose is recommended after age 20. Talk to your health care provider about which screenings and vaccines you need and how often you need them. This information is not intended to replace advice given to you by your health care provider. Make sure you discuss any questions you have with your health care provider. Document Released: 02/18/2015 Document Revised: 10/12/2015 Document Reviewed: 11/23/2014 Elsevier Interactive Patient Education  2017 [employer] Prevention in the Home Falls can cause injuries. They can happen to people of all ages. There are many things you can do to make your home safe and to help prevent falls. What can I do on the outside of my home? Regularly fix the edges of walkways and driveways and fix any cracks. Remove anything that might make you trip as you walk through a door, such as a raised step or threshold. Trim any bushes or trees on the path to your home. Use bright outdoor lighting. Clear any walking paths of anything that might make someone trip, such as rocks or tools. Regularly check to see if handrails are loose or broken. Make sure that both sides of any steps have handrails. Any raised decks and porches should have guardrails on the edges. Have any leaves, snow, or ice cleared regularly. Use sand or salt on walking  paths during winter. Clean up any spills in your garage right away. This includes oil or grease spills. What can I do in the bathroom? Use night lights. Install grab bars by the toilet and in the tub and shower. Do not use towel bars as grab bars. Use non-skid mats or decals in the tub or shower. If you need to sit down in the shower, use a plastic, non-slip stool. Keep the floor dry. Clean up any water that spills on the floor as soon as it happens. Remove soap buildup in the tub or shower regularly. Attach bath mats securely with double-sided non-slip rug tape. Do not have throw rugs and other things on the floor that can make you trip. What can I do in the bedroom? Use night lights. Make sure that you have a light by your bed that is easy to reach. Do not use any sheets or blankets that are too big for your bed. They should not hang down onto the floor. Have a firm chair that has side arms. You can use this for support while you get dressed. Do not have throw rugs and other things on the floor that can make you trip. What can I do in the kitchen? Clean up any spills right away. Avoid walking on wet floors. Keep items that you use a lot in easy-to-reach places. If you need to reach something above you, use a strong step stool that has a grab bar. Keep electrical cords out of the way. Do not use floor polish or wax that makes floors slippery. If you  must use wax, use non-skid floor wax. Do not have throw rugs and other things on the floor that can make you trip. What can I do with my stairs? Do not leave any items on the stairs. Make sure that there are handrails on both sides of the stairs and use them. Fix handrails that are broken or loose. Make sure that handrails are as long as the stairways. Check any carpeting to make sure that it is firmly attached to the stairs. Fix any carpet that is loose or worn. Avoid having throw rugs at the top or bottom of the stairs. If you do have  throw rugs, attach them to the floor with carpet tape. Make sure that you have a light switch at the top of the stairs and the bottom of the stairs. If you do not have them, ask someone to add them for you. What else can I do to help prevent falls? Wear shoes that: Do not have high heels. Have rubber bottoms. Are comfortable and fit you well. Are closed at the toe. Do not wear sandals. If you use a stepladder: Make sure that it is fully opened. Do not climb a closed stepladder. Make sure that both sides of the stepladder are locked into place. Ask someone to hold it for you, if possible. Clearly mark and make sure that you can see: Any grab bars or handrails. First and last steps. Where the edge of each step is. Use tools that help you move around (mobility aids) if they are needed. These include: Canes. Walkers. Scooters. Crutches. Turn on the lights when you go into a dark area. Replace any light bulbs as soon as they burn out. Set up your furniture so you have a clear path. Avoid moving your furniture around. If any of your floors are uneven, fix them. If there are any pets around you, be aware of where they are. Review your medicines with your doctor. Some medicines can make you feel dizzy. This can increase your chance of falling. Ask your doctor what other things that you can do to help prevent falls. This information is not intended to replace advice given to you by your health care provider. Make sure you discuss any questions you have with your health care provider. Document Released: 11/18/2008 Document Revised: 06/30/2015 Document Reviewed: 02/26/2014 Elsevier Interactive Patient Education  2017 [employer].

## 2021-10-18 NOTE — Progress Notes (Signed)
Subjective:   Susan Holland is a 76 y.o. female who presents for Medicare Annual (Subsequent) preventive examination.  Review of Systems    Virtual Visit via Telephone Note  I connected with  Susan Holland on 10/18/21 at  9:45 AM EDT by telephone and verified that I am speaking with the correct person using two identifiers.  Location: Patient: Home Provider: [place] Persons participating in the virtual visit: [place]   I discussed the limitations, risks, security and privacy concerns of performing an evaluation and management service by telephone and the availability of in person appointments. The patient expressed understanding and agreed to proceed.  Interactive audio and video telecommunications were attempted between this nurse and patient, however failed, due to patient having technical difficulties OR patient did not have access to video capability.  We continued and completed visit with audio only.  Some vital signs may be absent or patient reported.   Sheral Flow, LPN  Cardiac Risk Factors include: advanced age (>31mn, >>29women);dyslipidemia;hypertension;obesity (BMI >30kg/m2);family history of premature cardiovascular disease     Objective:    Today's Vitals   10/18/21 0959  Weight: 185 lb (83.9 kg)  Height: '5\' 5"'$  (1.651 m)   Body mass index is 30.79 kg/m.     10/18/2021    9:50 AM 01/03/2021    8:03 AM 01/02/2021    8:24 PM 10/17/2020   12:11 PM 10/14/2018    7:57 AM 10/08/2018    9:16 AM 08/15/2018    9:56 AM  Advanced Directives  Does Patient Have a Medical Advance Directive? Yes No Yes Yes No No Yes  Type of AParamedicof ACareyLiving will  [place] will Living will;Healthcare Power of ADilleyLiving will  Does patient want to make changes to medical advance directive?    No - Patient declined     Copy of HWaileain Chart? No - copy requested   No - copy requested   No - copy requested  Would patient like information on creating a medical advance directive?  No - Patient declined   No - Patient declined No - Patient declined     Current Medications (verified) Outpatient Encounter Medications as of 10/18/2021  Medication Sig   amoxicillin (AMOXIL) 500 MG tablet Take 2,000 mg by mouth once.   atorvastatin (LIPITOR) 20 MG tablet Take 1 tablet (20 mg total) by mouth every evening.   Calcium Carbonate-Vitamin D (CALCIUM + D PO) Take 1 tablet by mouth 2 (two) times daily.    losartan-hydrochlorothiazide (HYZAAR) 100-12.5 MG tablet Take 1 tablet by mouth daily.   Multiple Vitamin (MULTIVITAMIN WITH MINERALS) TABS tablet Take 1 tablet by mouth daily.   Omega-3 Fatty Acids (FISH OIL) 1200 MG CAPS Take 1,200 mg by mouth 2 (two) times daily.   triamcinolone cream (KENALOG) 0.1 % Apply topically as needed.   No facility-administered encounter medications on file as of 10/18/2021.    Allergies (verified) Patient has no known allergies.   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 ; benign   TONSILLECTOMY AND ADENOIDECTOMY     TOTAL HIP ARTHROPLASTY Right 10/14/2018   Procedure: RIght Anterior Hip Arthroplasty;  Surgeon: Melrose Nakayama, MD;  Location: [facility];  Service: Orthopedics;  Laterality: Right;   TUBAL LIGATION     Family History  Problem Relation Age of Onset   Dementia Mother        Alsheimer's   Stroke Father 62   Diabetes Sister    Heart disease Sister         AF   Colon cancer Maternal Uncle        X 2   Esophageal cancer Neg Hx    Rectal cancer Neg Hx    Stomach cancer Neg Hx    Social History   Socioeconomic History   Marital status: Widowed    Spouse name: Not on file   Number of children: 2   Years of education: Not on file   Highest education level: Not on file  Occupational History   Occupation: retired  Tobacco Use   Smoking status: Former    Types: Cigarettes    Quit date: 03/29/2015    Years since quitting: 6.5    Passive exposure: Never   Smokeless tobacco: Never   Tobacco comments:    smoked 1963- present, up to 1/2 ppd  Vaping Use   Vaping Use: Never used  Substance and Sexual Activity   Alcohol use: Yes    Alcohol/week: 0.0 standard drinks of alcohol    Comment:  occasionally    Drug use: No   Sexual activity: Never  Other Topics Concern   Not on file  Social History Narrative   Exercise:  Does some walking, but irregularly   Social Determinants of Health   Financial Resource Strain: Low Risk  (10/18/2021)   Overall Financial Resource Strain (CARDIA)    Difficulty of Paying Living Expenses: Not hard at all  Food Insecurity: No Food Insecurity (10/18/2021)   Hunger Vital Sign    Worried About Running Out of Food in the Last Year: Never true    Ran Out of Food in the Last Year: Never true  Transportation Needs: No Transportation Needs (10/18/2021)   PRAPARE - Hydrologist (Medical): No    Lack of Transportation (Non-Medical): No  Physical Activity: Inactive (10/18/2021)   Exercise Vital Sign    Days of Exercise per Week: 0 days    Minutes of Exercise per Session: 0 min  Stress: No Stress Concern Present (10/18/2021)   [place]    Feeling of Stress : Not at all  Social Connections: [place] (10/18/2021)   Social Connection and Isolation Panel [NHANES]    Frequency of Communication with Friends and  Family: More than three times a week    Frequency of Social Gatherings with Friends and Family: More than three times a week    Attends Religious Services: More than 4 times per year    Active Member of [employer] or Organizations: Yes    Attends Music therapist: More than 4 times per year    Marital Status: Married    Tobacco Counseling Counseling given: Not Answered Tobacco comments: smoked 1963- present, up to 1/2 ppd   Clinical Intake:  Pre-visit  preparation completed: Yes  Pain : No/denies pain     BMI - recorded: 30.79 Nutritional Status: BMI > 30  Obese Nutritional Risks: None Diabetes: No  How often do you need to have someone help you when you read instructions, pamphlets, or other written materials from your doctor or pharmacy?: 1 - Never What is the last grade level you completed in school?: HSG  Diabetic? no  Interpreter Needed?: No  Information entered by :: Lisette Abu, LPN.   Activities of Daily Living    10/18/2021    9:53 AM  In your present state of health, do you have any difficulty performing the following activities:  Hearing? 0  Vision? 0  Difficulty concentrating or making decisions? 0  Walking or climbing stairs? 0  Dressing or bathing? 0  Doing errands, shopping? 0  Preparing Food and eating ? N  Using the Toilet? N  In the past six months, have you accidently leaked urine? N  Do you have problems with loss of bowel control? N  Managing your Medications? N  Managing your Finances? N  Housekeeping or managing your Housekeeping? N    Patient Care Team: Binnie Rail, MD as PCP - General (Internal Medicine) Paula Compton, MD as Consulting Physician (Obstetrics and Gynecology) Monna Fam, MD as Consulting Physician (Ophthalmology)  Indicate any recent Medical Services you may have received from other than [facility] providers in the past year (date may be approximate).     Assessment:   This is a routine wellness  examination for Susan Holland.  Hearing/Vision screen Hearing Screening - Comments:: Denies hearing difficulties   Vision Screening - Comments:: Wears readers- up to date with routine eye exams with Dr. Monna Fam   Dietary issues and exercise activities discussed: Current Exercise Habits: The patient does not participate in regular exercise at present, Exercise limited by: None identified   Goals Addressed             This Visit's Progress    My goal is to be more physically active.        Depression Screen    10/18/2021    9:53 AM 10/17/2020   12:06 PM 02/09/2020   10:53 AM 08/15/2018    8:55 AM 06/05/2017    8:32 AM 05/17/2014    9:37 AM 04/13/2013   10:43 AM  PHQ 2/9 Scores  PHQ - 2 Score 0 0 0 0 1 0 0  PHQ- 9 Score     5      Fall Risk    10/18/2021    9:51 AM 10/17/2020   12:12 PM 02/09/2020   10:50 AM 12/31/2018    5:49 PM 08/15/2018    8:54 AM  [place] in the past year? 0 0 0 0 0  Comment    Emmi Telephone Survey: data to providers prior to load   Number falls in past yr: 0 0 0  0  Injury with Fall? 0 0 0    Risk for fall due to : No Fall Risks No Fall Risks No Fall Risks    Follow up Falls prevention discussed Falls evaluation completed Falls evaluation completed      [place]:  Any stairs in or around the home? Yes  If so, are there any without handrails? No  Home free of loose throw rugs in walkways, pet beds, electrical cords, etc? Yes  Adequate lighting in your home to reduce risk of falls?  Yes   ASSISTIVE DEVICES UTILIZED TO PREVENT FALLS:  Life alert? No  Use of a cane, walker or w/c? No  Grab bars in the bathroom? Yes  Shower chair or bench in shower? No  Elevated toilet seat or a handicapped toilet? No   TIMED UP AND GO:  Was the test performed? No .  Length of time to ambulate 10 feet: n/a sec.   Appearance of gait: Gait not evaluated during this visit.  Cognitive Function:    06/05/2017    8:36 AM   MMSE - Mini Mental State Exam  Orientation to time 5  Orientation to Place 5  Registration 3  Attention/ Calculation 3  Recall 1  Language- name 2 objects 2  Language- repeat 1  Language- follow 3 step command 3  Language- read & follow direction 1  Write a sentence 1  Copy design 1  Total score 26        10/18/2021   10:03 AM  6CIT Screen  What Year? 0 points  What month? 0 points  What time? 0 points  Count back from 20 0 points  Months in reverse 0 points  Repeat phrase 0 points  Total Score 0 points    Immunizations Immunization History  Administered Date(s) Administered   Fluad Quad(high Dose 65+) 11/21/2018, 10/17/2020   Influenza Whole 10/29/2007   Influenza, High Dose Seasonal PF 11/14/2016, 11/06/2017, 11/26/2019   Influenza,inj,Quad PF,6+ Mos 11/14/2012   Influenza-Unspecified 11/04/2013, 12/16/2014, 11/10/2015   PFIZER(Purple Top)SARS-COV-2 Vaccination 03/21/2019, 04/15/2019, 01/14/2020   Pneumococcal Conjugate-13 10/05/2014   Pneumococcal Polysaccharide-23 03/19/2011   Td 03/17/2010   Zoster Recombinat (Shingrix) 06/10/2017, 09/05/2017   Zoster, Live 07/02/2006    TDAP status: Due, Education has been provided regarding the importance of this vaccine. Advised may receive this vaccine at local pharmacy or Health Dept. Aware to provide a copy of the vaccination record if obtained from local pharmacy or Health Dept. Verbalized acceptance and understanding.  Flu Vaccine status: Due, Education has been provided regarding the importance of this vaccine. Advised may receive this vaccine at local pharmacy or Health Dept. Aware to provide a copy of the vaccination record if obtained from local pharmacy or Health Dept. Verbalized acceptance and understanding.  Pneumococcal vaccine status: Up to date  Covid-19 vaccine status: Completed vaccines  Qualifies for Shingles Vaccine? Yes   Zostavax completed Yes   Shingrix Completed?: Yes  Screening Tests Health  Maintenance  Topic Date Due   Diabetic kidney evaluation - Urine ACR  Never done   COVID-19 Vaccine (4 - Pfizer risk series) 03/10/2020   TETANUS/TDAP  03/17/2020   INFLUENZA VACCINE  09/05/2021   OPHTHALMOLOGY EXAM  11/09/2021   FOOT EXAM  02/13/2022   HEMOGLOBIN A1C  02/23/2022   DEXA SCAN  03/10/2022   Diabetic kidney evaluation - GFR measurement  08/24/2022   COLONOSCOPY (Pts 45-68yr Insurance coverage will need to be confirmed)  05/31/2024   Pneumonia Vaccine 76 Years old  Completed   Hepatitis C Screening  Completed   Zoster Vaccines- Shingrix  Completed   HPV VACCINES  Aged Out    Health Maintenance  Health Maintenance Due  Topic Date Due   Diabetic kidney evaluation - Urine ACR  Never done   COVID-19 Vaccine (4 - Pfizer risk series) 03/10/2020   TETANUS/TDAP  03/17/2020   INFLUENZA VACCINE  09/05/2021    Colorectal cancer screening: Type of screening: Colonoscopy. Completed 05/31/2021. Repeat every 3 years  Mammogram status: Completed 04/2021. Repeat  every year (done at OB/GYN office)  Bone Density status: Completed 03/10/2020. Results reflect: Bone density results: OSTEOPENIA. Repeat every 2 years.  Lung Cancer Screening: (Low Dose CT Chest recommended if Age 38-80 years, 30 pack-year currently smoking OR have quit w/in 15years.) does not qualify.   Lung Cancer Screening Referral: no  Additional Screening:  Hepatitis C Screening: does not qualify; Completed 05/19/2015  Vision Screening: Recommended annual ophthalmology exams for early detection of glaucoma and other disorders of the eye. Is the patient up to date with their annual eye exam?  Yes  Who is the provider or what is the name of the office in which the patient attends annual eye exams? Monna Fam, MD. If pt is not established with a provider, would they like to be referred to a provider to establish care? No .   Dental Screening: Recommended annual dental exams for proper oral hygiene  Community  Resource Referral / Chronic Care Management: CRR required this visit?  No   CCM required this visit?  No      Plan:     I have personally reviewed and noted the following in the patient's chart:   Medical and social history Use of alcohol, tobacco or illicit drugs  Current medications and supplements including opioid prescriptions. Patient is not currently taking opioid prescriptions. Functional ability and status Nutritional status Physical activity Advanced directives List of other physicians Hospitalizations, surgeries, and ER visits in previous 12 months Vitals Screenings to include cognitive, depression, and falls Referrals and appointments  In addition, I have reviewed and discussed with patient certain preventive protocols, quality metrics, and best practice recommendations. A written personalized care plan for preventive services as well as general preventive health recommendations were provided to patient.     Sheral Flow, LPN   1/59/4585   Nurse Notes:  Patient is cogitatively intact. There were no vitals filed for this visit. Patient stated that she has no issues with gait or balance; does not use any assistive devices.

## 2021-11-10 DIAGNOSIS — H524 Presbyopia: Secondary | ICD-10-CM | POA: Diagnosis not present

## 2021-11-10 DIAGNOSIS — H04123 Dry eye syndrome of bilateral lacrimal glands: Secondary | ICD-10-CM | POA: Diagnosis not present

## 2021-11-10 DIAGNOSIS — H35033 Hypertensive retinopathy, bilateral: Secondary | ICD-10-CM | POA: Diagnosis not present

## 2021-11-10 DIAGNOSIS — Z961 Presence of intraocular lens: Secondary | ICD-10-CM | POA: Diagnosis not present

## 2021-11-10 DIAGNOSIS — H40013 Open angle with borderline findings, low risk, bilateral: Secondary | ICD-10-CM | POA: Diagnosis not present

## 2021-11-10 LAB — HM DIABETES EYE EXAM

## 2021-12-19 DIAGNOSIS — D225 Melanocytic nevi of trunk: Secondary | ICD-10-CM | POA: Diagnosis not present

## 2021-12-19 DIAGNOSIS — Z08 Encounter for follow-up examination after completed treatment for malignant neoplasm: Secondary | ICD-10-CM | POA: Diagnosis not present

## 2021-12-19 DIAGNOSIS — D2272 Melanocytic nevi of left lower limb, including hip: Secondary | ICD-10-CM | POA: Diagnosis not present

## 2021-12-19 DIAGNOSIS — Z85820 Personal history of malignant melanoma of skin: Secondary | ICD-10-CM | POA: Diagnosis not present

## 2021-12-19 DIAGNOSIS — L814 Other melanin hyperpigmentation: Secondary | ICD-10-CM | POA: Diagnosis not present

## 2021-12-19 DIAGNOSIS — D492 Neoplasm of unspecified behavior of bone, soft tissue, and skin: Secondary | ICD-10-CM | POA: Diagnosis not present

## 2021-12-19 DIAGNOSIS — L821 Other seborrheic keratosis: Secondary | ICD-10-CM | POA: Diagnosis not present

## 2021-12-19 DIAGNOSIS — L570 Actinic keratosis: Secondary | ICD-10-CM | POA: Diagnosis not present

## 2022-02-02 ENCOUNTER — Encounter: Payer: Self-pay | Admitting: Internal Medicine

## 2022-02-02 NOTE — Progress Notes (Signed)
Outside notes received. Information abstracted. Notes sent to scan.  

## 2022-02-22 ENCOUNTER — Encounter: Payer: Self-pay | Admitting: Internal Medicine

## 2022-02-22 NOTE — Progress Notes (Signed)
Subjective:    Patient ID: Susan Holland, female    DOB: 12-31-1945, 77 y.o.   MRN: [id number]      HPI Susan Holland is here for a Physical exam.   She has no concerns.   Reclast if dexa shows she needs it.     Medications and allergies reviewed with patient and updated if appropriate.  Current Outpatient Medications on File Prior to Visit  Medication Sig Dispense Refill   amoxicillin (AMOXIL) 500 MG tablet Take 2,000 mg by mouth once.     atorvastatin (LIPITOR) 20 MG tablet Take 1 tablet (20 mg total) by mouth every evening. 90 tablet 3   Calcium Carbonate-Vitamin D (CALCIUM + D PO) Take 1 tablet by mouth 2 (two) times daily.      losartan-hydrochlorothiazide (HYZAAR) 100-12.5 MG tablet Take 1 tablet by mouth daily. 90 tablet 3   Multiple Vitamin (MULTIVITAMIN WITH MINERALS) TABS tablet Take 1 tablet by mouth daily.     Omega-3 Fatty Acids (FISH OIL) 1200 MG CAPS Take 1,200 mg by mouth 2 (two) times daily.     triamcinolone cream (KENALOG) 0.1 % Apply topically as needed.     No current facility-administered medications on file prior to visit.    Review of Systems  Constitutional:  Negative for fever.  Eyes:  Negative for visual disturbance.  Respiratory:  Positive for shortness of breath (with strenuous exertion). Negative for cough and wheezing.   Cardiovascular:  Negative for chest pain, palpitations and leg swelling.  Gastrointestinal:  Positive for constipation. Negative for abdominal pain, blood in stool, diarrhea and nausea.       No gerd  Genitourinary:  Negative for dysuria.  Musculoskeletal:  Negative for arthralgias and back pain.  Skin:  Negative for rash.  Neurological:  Negative for light-headedness, numbness and headaches.  Psychiatric/Behavioral:  Negative for dysphoric mood. The patient is not nervous/anxious.        Objective:   Vitals:   02/23/22 1044  BP: 134/80  Pulse: 85  Temp: 98.1 F (36.7 C)  SpO2: 95%   Filed Weights   02/23/22 1044   Weight: 188 lb (85.3 kg)   Body mass index is 31.28 kg/m.  BP Readings from Last 3 Encounters:  02/23/22 134/80  08/23/21 130/70  05/31/21 (!) 141/69    Wt Readings from Last 3 Encounters:  02/23/22 188 lb (85.3 kg)  10/18/21 185 lb (83.9 kg)  04/28/21 185 lb (83.9 kg)       Physical Exam Constitutional: She appears well-developed and well-nourished. No distress.  HENT:  Head: Normocephalic and atraumatic.  Right Ear: External ear normal. Normal ear canal and TM Left Ear: External ear normal.  Normal ear canal and TM Mouth/Throat: Oropharynx is clear and moist.  Eyes: Conjunctivae normal.  Neck: Neck supple. No tracheal deviation present. No thyromegaly present.  No carotid bruit  Cardiovascular: Normal rate, regular rhythm and normal heart sounds.   No murmur heard.  No edema. Pulmonary/Chest: Effort normal and breath sounds normal. No respiratory distress. She has no wheezes. She has no rales.  Breast: deferred   Abdominal: Soft.  Protuberant . There is no tenderness.  Lymphadenopathy: She has no cervical adenopathy.  Skin: Skin is warm and dry. She is not diaphoretic.  Psychiatric: She has a normal mood and affect. Her behavior is normal.     Lab Results  Component Value Date   WBC 6.6 02/13/2021   HGB 13.9 02/13/2021   HCT 42.4 02/13/2021  PLT 208.0 02/13/2021   GLUCOSE 102 (H) 08/23/2021   CHOL 155 08/23/2021   TRIG 120.0 08/23/2021   HDL 52.70 08/23/2021   LDLDIRECT 159.4 03/19/2011   LDLCALC 78 08/23/2021   ALT 19 08/23/2021   AST 21 08/23/2021   NA 135 08/23/2021   K 3.9 08/23/2021   CL 97 08/23/2021   CREATININE 0.82 08/23/2021   BUN 17 08/23/2021   CO2 31 08/23/2021   TSH 3.00 02/13/2021   INR 1.0 10/08/2018   HGBA1C 6.6 (H) 08/23/2021         Assessment & Plan:   Physical exam: Screening blood work  ordered Exercise  none-stressed the importance of regular exercise Weight  advised weight loss Substance abuse  none   Reviewed  recommended immunizations.   Health Maintenance  Topic Date Due   Diabetic kidney evaluation - Urine ACR  Never done   DTaP/Tdap/Td (2 - Tdap) 03/17/2020   FOOT EXAM  02/13/2022   HEMOGLOBIN A1C  02/23/2022   DEXA SCAN  03/10/2022   Diabetic kidney evaluation - eGFR measurement  08/24/2022   Medicare Annual Wellness (AWV)  10/19/2022   OPHTHALMOLOGY EXAM  11/11/2022   COLONOSCOPY (Pts 45-68yr Insurance coverage will need to be confirmed)  05/31/2024   Pneumonia Vaccine 77 Years old  Completed   INFLUENZA VACCINE  Completed   Hepatitis C Screening  Completed   Zoster Vaccines- Shingrix  Completed   HPV VACCINES  Aged Out   COVID-19 Vaccine  Discontinued          See Problem List for Assessment and Plan of chronic medical problems.   Started Ozempic-follow-up in 3 months.  Stressed weight loss, exercise

## 2022-02-22 NOTE — Patient Instructions (Addendum)
Blood work was ordered.   The lab is on the first floor.    Medications changes include :   ozempic 0.25 mg weekly x 4 weeks, then 0.5 mg weekly     Return in about 3 months (around 05/25/2022) for follow up, Schedule DEXA-[facility].   Health Maintenance, Female Adopting a healthy lifestyle and getting preventive care are important in promoting health and wellness. Ask your health care provider about: The right schedule for you to have regular tests and exams. Things you can do on your own to prevent diseases and keep yourself healthy. What should I know about diet, weight, and exercise? Eat a healthy diet  Eat a diet that includes plenty of vegetables, fruits, low-fat dairy products, and lean protein. Do not eat a lot of foods that are high in solid fats, added sugars, or sodium. Maintain a healthy weight Body mass index (BMI) is used to identify weight problems. It estimates body fat based on height and weight. Your health care provider can help determine your BMI and help you achieve or maintain a healthy weight. Get regular exercise Get regular exercise. This is one of the most important things you can do for your health. Most adults should: Exercise for at least 150 minutes each week. The exercise should increase your heart rate and make you sweat (moderate-intensity exercise). Do strengthening exercises at least twice a week. This is in addition to the moderate-intensity exercise. Spend less time sitting. Even light physical activity can be beneficial. Watch cholesterol and blood lipids Have your blood tested for lipids and cholesterol at 77 years of age, then have this test every 5 years. Have your cholesterol levels checked more often if: Your lipid or cholesterol levels are high. You are older than 77 years of age. You are at high risk for heart disease. What should I know about cancer screening? Depending on your health history and family history, you may need to have  cancer screening at various ages. This may include screening for: Breast cancer. Cervical cancer. Colorectal cancer. Skin cancer. Lung cancer. What should I know about heart disease, diabetes, and high blood pressure? Blood pressure and heart disease High blood pressure causes heart disease and increases the risk of stroke. This is more likely to develop in people who have high blood pressure readings or are overweight. Have your blood pressure checked: Every 3-5 years if you are 74-64 years of age. Every year if you are 49 years old or older. Diabetes Have regular diabetes screenings. This checks your fasting blood sugar level. Have the screening done: Once every three years after age 38 if you are at a normal weight and have a low risk for diabetes. More often and at a younger age if you are overweight or have a high risk for diabetes. What should I know about preventing infection? Hepatitis B If you have a higher risk for hepatitis B, you should be screened for this virus. Talk with your health care provider to find out if you are at risk for hepatitis B infection. Hepatitis C Testing is recommended for: Everyone born from [birth year] through 1965. Anyone with known risk factors for hepatitis C. Sexually transmitted infections (STIs) Get screened for STIs, including gonorrhea and chlamydia, if: You are sexually active and are younger than 77 years of age. You are older than 77 years of age and your health care provider tells you that you are at risk for this type of infection. Your sexual  activity has changed since you were last screened, and you are at increased risk for chlamydia or gonorrhea. Ask your health care provider if you are at risk. Ask your health care provider about whether you are at high risk for HIV. Your health care provider may recommend a prescription medicine to help prevent HIV infection. If you choose to take medicine to prevent HIV, you should first get tested for HIV.  You should then be tested every 3 months for as long as you are taking the medicine. Pregnancy If you are about to stop having your period (premenopausal) and you may become pregnant, seek counseling before you get pregnant. Take 400 to 800 micrograms (mcg) of folic acid every day if you become pregnant. Ask for birth control (contraception) if you want to prevent pregnancy. Osteoporosis and menopause Osteoporosis is a disease in which the bones lose minerals and strength with aging. This can result in bone fractures. If you are 60 years old or older, or if you are at risk for osteoporosis and fractures, ask your health care provider if you should: Be screened for bone loss. Take a calcium or vitamin D supplement to lower your risk of fractures. Be given hormone replacement therapy (HRT) to treat symptoms of menopause. Follow these instructions at home: Alcohol use Do not drink alcohol if: Your health care provider tells you not to drink. You are pregnant, may be pregnant, or are planning to become pregnant. If you drink alcohol: Limit how much you have to: 0-1 drink a day. Know how much alcohol is in your drink. In the [place] one drink equals one 12 oz bottle of beer (355 mL), one 5 oz glass of wine (148 mL), or one 1 oz glass of hard liquor (44 mL). Lifestyle Do not use any products that contain nicotine or tobacco. These products include cigarettes, chewing tobacco, and vaping devices, such as e-cigarettes. If you need help quitting, ask your health care provider. Do not use street drugs. Do not share needles. Ask your health care provider for help if you need support or information about quitting drugs. General instructions Schedule regular health, dental, and eye exams. Stay current with your vaccines. Tell your health care provider if: You often feel depressed. You have ever been abused or do not feel safe at home. Summary Adopting a healthy lifestyle and getting preventive care  are important in promoting health and wellness. Follow your health care provider's instructions about healthy diet, exercising, and getting tested or screened for diseases. Follow your health care provider's instructions on monitoring your cholesterol and blood pressure. This information is not intended to replace advice given to you by your health care provider. Make sure you discuss any questions you have with your health care provider. Document Revised: 06/13/2020 Document Reviewed: 06/13/2020 Elsevier Patient Education  [place] [place].

## 2022-02-23 ENCOUNTER — Telehealth: Payer: Self-pay

## 2022-02-23 ENCOUNTER — Ambulatory Visit (INDEPENDENT_AMBULATORY_CARE_PROVIDER_SITE_OTHER): Payer: PPO | Admitting: Internal Medicine

## 2022-02-23 VITALS — BP 134/80 | HR 85 | Temp 98.1°F | Ht 65.0 in | Wt 188.0 lb

## 2022-02-23 DIAGNOSIS — I10 Essential (primary) hypertension: Secondary | ICD-10-CM

## 2022-02-23 DIAGNOSIS — E782 Mixed hyperlipidemia: Secondary | ICD-10-CM | POA: Diagnosis not present

## 2022-02-23 DIAGNOSIS — Z0000 Encounter for general adult medical examination without abnormal findings: Secondary | ICD-10-CM

## 2022-02-23 DIAGNOSIS — M816 Localized osteoporosis [Lequesne]: Secondary | ICD-10-CM

## 2022-02-23 DIAGNOSIS — E119 Type 2 diabetes mellitus without complications: Secondary | ICD-10-CM | POA: Diagnosis not present

## 2022-02-23 LAB — CBC WITH DIFFERENTIAL/PLATELET
Basophils Absolute: 0.1 10*3/uL (ref 0.0–0.1)
Basophils Relative: 0.9 % (ref 0.0–3.0)
Eosinophils Absolute: 0.1 10*3/uL (ref 0.0–0.7)
Eosinophils Relative: 0.6 % (ref 0.0–5.0)
HCT: 42.2 % (ref 36.0–46.0)
Hemoglobin: 14 g/dL (ref 12.0–15.0)
Lymphocytes Relative: 23.8 % (ref 12.0–46.0)
Lymphs Abs: 2 10*3/uL (ref 0.7–4.0)
MCHC: 33.3 g/dL (ref 30.0–36.0)
MCV: 90.8 fl (ref 78.0–100.0)
Monocytes Absolute: 0.7 10*3/uL (ref 0.1–1.0)
Monocytes Relative: 7.8 % (ref 3.0–12.0)
Neutro Abs: 5.6 10*3/uL (ref 1.4–7.7)
Neutrophils Relative %: 66.9 % (ref 43.0–77.0)
Platelets: 221 10*3/uL (ref 150.0–400.0)
RBC: 4.64 Mil/uL (ref 3.87–5.11)
RDW: 13.7 % (ref 11.5–15.5)
WBC: 8.4 10*3/uL (ref 4.0–10.5)

## 2022-02-23 LAB — LIPID PANEL
Cholesterol: 142 mg/dL (ref 0–200)
HDL: 50.8 mg/dL (ref >39.00)
LDL Cholesterol: 69 mg/dL (ref 0–99)
NonHDL: 90.99
Total CHOL/HDL Ratio: 3
Triglycerides: 111 mg/dL (ref 0.0–149.0)
VLDL: 22.2 mg/dL (ref 0.0–40.0)

## 2022-02-23 LAB — COMPREHENSIVE METABOLIC PANEL
ALT: 16 U/L (ref 0–35)
AST: 20 U/L (ref 0–37)
Albumin: 4.4 g/dL (ref 3.5–5.2)
Alkaline Phosphatase: 52 U/L (ref 39–117)
BUN: 23 mg/dL (ref 6–23)
CO2: 29 mEq/L (ref 19–32)
Calcium: 11 mg/dL — ABNORMAL HIGH (ref 8.4–10.5)
Chloride: 98 mEq/L (ref 96–112)
Creatinine, Ser: 1.28 mg/dL — ABNORMAL HIGH (ref 0.40–1.20)
GFR: 40.58 mL/min — ABNORMAL LOW (ref >60.00)
Glucose, Bld: 111 mg/dL — ABNORMAL HIGH (ref 70–99)
Potassium: 4 mEq/L (ref 3.5–5.1)
Sodium: 139 mEq/L (ref 135–145)
Total Bilirubin: 0.7 mg/dL (ref 0.2–1.2)
Total Protein: 7.6 g/dL (ref 6.0–8.3)

## 2022-02-23 LAB — MICROALBUMIN / CREATININE URINE RATIO
Creatinine,U: 95.4 mg/dL
Microalb Creat Ratio: 1 mg/g (ref 0.0–30.0)
Microalb, Ur: 0.9 mg/dL (ref 0.0–1.9)

## 2022-02-23 LAB — HEMOGLOBIN A1C: Hgb A1c MFr Bld: 7 % — ABNORMAL HIGH (ref 4.6–6.5)

## 2022-02-23 LAB — VITAMIN D 25 HYDROXY (VIT D DEFICIENCY, FRACTURES): VITD: 51.68 ng/mL (ref 30.00–100.00)

## 2022-02-23 MED ORDER — OZEMPIC (0.25 OR 0.5 MG/DOSE) 2 MG/1.5ML ~~LOC~~ SOPN: PEN_INJECTOR | SUBCUTANEOUS | 1 refills | Status: DC

## 2022-02-23 NOTE — Telephone Encounter (Signed)
PA for Ozempic approved effective 02/23/22-02/24/23.

## 2022-02-23 NOTE — Assessment & Plan Note (Addendum)
Chronic   Lab Results  Component Value Date   HGBA1C 6.6 (H) 08/23/2021   Sugars controlled Check A1c, urine microalbumin today Discussed medication to help keep sugars controlled and possibly help with weight loss-she is interested in a weekly injection-start Ozempic 0.25 mg weekly x 4 weeks then increase to 0.5 mg weekly Stressed regular exercise, diabetic diet Will start to work on weight loss Follow-up in 3 months

## 2022-02-23 NOTE — Assessment & Plan Note (Addendum)
Chronic DEXA due next month-ordered Not currently on medication-did 4 injections of Prolia and then it was not affordable Stressed regular exercise Stressed calcium and vitamin D Check vitamin D level Start Reclast after bone density

## 2022-02-23 NOTE — Assessment & Plan Note (Signed)
Chronic °Regular exercise and healthy diet encouraged °Check lipid panel  °Continue atorvastatin 20 mg daily °

## 2022-02-23 NOTE — Assessment & Plan Note (Signed)
Chronic Blood pressure well controlled CMP Continue losartan-HCTZ 100-12.5 mg daily

## 2022-02-26 ENCOUNTER — Encounter: Payer: Self-pay | Admitting: Internal Medicine

## 2022-03-01 NOTE — Telephone Encounter (Signed)
-----  Message from Binnie Rail, MD sent at 02/28/2022  8:30 PM EST ----- Most recent mychart msg from dghtr --- ozmepic very expensive copay - can we have pharmacy look into pt assistance or other options?  SB

## 2022-03-01 NOTE — Telephone Encounter (Signed)
Message sent to Auth pool to look into patient assistance for patient.

## 2022-03-02 ENCOUNTER — Other Ambulatory Visit (HOSPITAL_COMMUNITY): Payer: Self-pay

## 2022-03-02 NOTE — Telephone Encounter (Signed)
Routing to [employer].

## 2022-03-02 NOTE — Telephone Encounter (Signed)
Letter will be sent to patient from office on Monday.

## 2022-03-07 ENCOUNTER — Other Ambulatory Visit (HOSPITAL_COMMUNITY): Payer: Self-pay

## 2022-04-13 NOTE — Telephone Encounter (Signed)
You can send it to [phone] ATTN: Tony Friscia, or email it to me please and thanks.

## 2022-05-03 ENCOUNTER — Other Ambulatory Visit (HOSPITAL_COMMUNITY): Payer: Self-pay

## 2022-05-03 ENCOUNTER — Telehealth: Payer: Self-pay

## 2022-05-03 NOTE — Telephone Encounter (Signed)
Submitted online application to [employer]

## 2022-05-07 ENCOUNTER — Other Ambulatory Visit (HOSPITAL_COMMUNITY): Payer: Self-pay

## 2022-05-07 NOTE — Telephone Encounter (Signed)
Patient approved for patient assistance through [employer] for [place] until 02/05/2023  Medication will be mailed to provider's office in 10 to 14 business days.

## 2022-05-18 DIAGNOSIS — Z1231 Encounter for screening mammogram for malignant neoplasm of breast: Secondary | ICD-10-CM | POA: Diagnosis not present

## 2022-05-24 ENCOUNTER — Encounter: Payer: Self-pay | Admitting: Internal Medicine

## 2022-05-24 NOTE — Progress Notes (Signed)
Subjective:    Patient ID: Susan Holland, female    DOB: [birth year], 77 y.o.   MRN: [id number]     HPI Susan Holland is here for follow up of her chronic medical problems.  No concerns.  No exercise.  Has lost some weight - likely related to ozempic  Medications and allergies reviewed with patient and updated if appropriate.  Current Outpatient Medications on File Prior to Visit  Medication Sig Dispense Refill   amoxicillin (AMOXIL) 500 MG tablet Take 2,000 mg by mouth once.     atorvastatin (LIPITOR) 20 MG tablet Take 1 tablet (20 mg total) by mouth every evening. 90 tablet 3   Calcium Carbonate-Vitamin D (CALCIUM + D PO) Take 1 tablet by mouth 2 (two) times daily.      losartan-hydrochlorothiazide (HYZAAR) 100-12.5 MG tablet Take 1 tablet by mouth daily. 90 tablet 3   Multiple Vitamin (MULTIVITAMIN WITH MINERALS) TABS tablet Take 1 tablet by mouth daily.     Omega-3 Fatty Acids (FISH OIL) 1200 MG CAPS Take 1,200 mg by mouth 2 (two) times daily.     Semaglutide,0.25 or 0.5MG /DOS, (OZEMPIC, 0.25 OR 0.5 MG/DOSE,) 2 MG/1.5ML SOPN Inject 0.25 mg into the skin once a week for 30 days, THEN 0.5 mg once a week. 2 mL 1   triamcinolone cream (KENALOG) 0.1 % Apply topically as needed.     No current facility-administered medications on file prior to visit.     Review of Systems  Constitutional:  Negative for fever.  Respiratory:  Negative for cough, shortness of breath and wheezing.   Cardiovascular:  Negative for chest pain, palpitations and leg swelling.  Neurological:  Negative for light-headedness and headaches.       Objective:   Vitals:   05/25/22 1110  BP: 130/74  Pulse: 80  Temp: 98 F (36.7 C)  SpO2: 96%   BP Readings from Last 3 Encounters:  05/25/22 130/74  02/23/22 134/80  08/23/21 130/70   Wt Readings from Last 3 Encounters:  05/25/22 178 lb (80.7 kg)  02/23/22 188 lb (85.3 kg)  10/18/21 185 lb (83.9 kg)   Body mass index is 29.62 kg/m.    Physical  Exam Constitutional:      General: She is not in acute distress.    Appearance: Normal appearance.  HENT:     Head: Normocephalic and atraumatic.  Eyes:     Conjunctiva/sclera: Conjunctivae normal.  Cardiovascular:     Rate and Rhythm: Normal rate and regular rhythm.     Heart sounds: Normal heart sounds.  Pulmonary:     Effort: Pulmonary effort is normal. No respiratory distress.     Breath sounds: Normal breath sounds. No wheezing.  Musculoskeletal:     Cervical back: Neck supple.     Right lower leg: No edema.     Left lower leg: No edema.  Lymphadenopathy:     Cervical: No cervical adenopathy.  Skin:    General: Skin is warm and dry.     Findings: No rash.  Neurological:     Mental Status: She is alert. Mental status is at baseline.  Psychiatric:        Mood and Affect: Mood normal.        Behavior: Behavior normal.        Lab Results  Component Value Date   WBC 8.4 02/23/2022   HGB 14.0 02/23/2022   HCT 42.2 02/23/2022   PLT 221.0 02/23/2022   GLUCOSE 111 (H)  02/23/2022   CHOL 142 02/23/2022   TRIG 111.0 02/23/2022   HDL 50.80 02/23/2022   LDLDIRECT 159.4 03/19/2011   LDLCALC 69 02/23/2022   ALT 16 02/23/2022   AST 20 02/23/2022   NA 139 02/23/2022   K 4.0 02/23/2022   CL 98 02/23/2022   CREATININE 1.28 (H) 02/23/2022   BUN 23 02/23/2022   CO2 29 02/23/2022   TSH 3.00 02/13/2021   INR 1.0 10/08/2018   HGBA1C 7.0 (H) 02/23/2022   MICROALBUR 0.9 02/23/2022     Assessment & Plan:    See Problem List for Assessment and Plan of chronic medical problems.

## 2022-05-24 NOTE — Patient Instructions (Addendum)
      Blood work was ordered.   The lab is on the first floor.    Medications changes include :   None - continue ozempic 0.5 mg weekly     Return in about 6 months (around 11/24/2022) for Physical Exam, Schedule DEXA-[facility] (ordered last visit).

## 2022-05-25 ENCOUNTER — Ambulatory Visit (INDEPENDENT_AMBULATORY_CARE_PROVIDER_SITE_OTHER): Payer: PPO | Admitting: Internal Medicine

## 2022-05-25 VITALS — BP 130/74 | HR 80 | Temp 98.0°F | Ht 65.0 in | Wt 178.0 lb

## 2022-05-25 DIAGNOSIS — E782 Mixed hyperlipidemia: Secondary | ICD-10-CM

## 2022-05-25 DIAGNOSIS — E119 Type 2 diabetes mellitus without complications: Secondary | ICD-10-CM | POA: Diagnosis not present

## 2022-05-25 DIAGNOSIS — I10 Essential (primary) hypertension: Secondary | ICD-10-CM

## 2022-05-25 DIAGNOSIS — M816 Localized osteoporosis [Lequesne]: Secondary | ICD-10-CM

## 2022-05-25 LAB — COMPREHENSIVE METABOLIC PANEL
ALT: 15 U/L (ref 0–35)
AST: 20 U/L (ref 0–37)
Albumin: 4.6 g/dL (ref 3.5–5.2)
Alkaline Phosphatase: 54 U/L (ref 39–117)
BUN: 12 mg/dL (ref 6–23)
CO2: 30 mEq/L (ref 19–32)
Calcium: 10.8 mg/dL — ABNORMAL HIGH (ref 8.4–10.5)
Chloride: 96 mEq/L (ref 96–112)
Creatinine, Ser: 0.93 mg/dL (ref 0.40–1.20)
GFR: 59.44 mL/min — ABNORMAL LOW (ref >60.00)
Glucose, Bld: 101 mg/dL — ABNORMAL HIGH (ref 70–99)
Potassium: 4.1 mEq/L (ref 3.5–5.1)
Sodium: 136 mEq/L (ref 135–145)
Total Bilirubin: 0.7 mg/dL (ref 0.2–1.2)
Total Protein: 7.8 g/dL (ref 6.0–8.3)

## 2022-05-25 LAB — HEMOGLOBIN A1C: Hgb A1c MFr Bld: 6.2 % (ref 4.6–6.5)

## 2022-05-25 LAB — LIPID PANEL
Cholesterol: 135 mg/dL (ref 0–200)
HDL: 43.1 mg/dL (ref >39.00)
LDL Cholesterol: 75 mg/dL (ref 0–99)
NonHDL: 91.86
Total CHOL/HDL Ratio: 3
Triglycerides: 82 mg/dL (ref 0.0–149.0)
VLDL: 16.4 mg/dL (ref 0.0–40.0)

## 2022-05-25 LAB — VITAMIN D 25 HYDROXY (VIT D DEFICIENCY, FRACTURES): VITD: 54.88 ng/mL (ref 30.00–100.00)

## 2022-05-25 MED ORDER — OZEMPIC (0.25 OR 0.5 MG/DOSE) 2 MG/1.5ML ~~LOC~~ SOPN: 0.5000 mg | PEN_INJECTOR | SUBCUTANEOUS | 1 refills | Status: DC

## 2022-05-25 NOTE — Assessment & Plan Note (Signed)
Chronic °Blood pressure well controlled °CMP °Continue losartan-HCTZ 100-12.5 mg daily °

## 2022-05-25 NOTE — Assessment & Plan Note (Signed)
Chronic °Regular exercise and healthy diet encouraged °Check lipid panel  °Continue atorvastatin 20 mg daily °

## 2022-05-25 NOTE — Assessment & Plan Note (Addendum)
Chronic   Lab Results  Component Value Date   HGBA1C 7.0 (H) 02/23/2022   Sugars not ideally controlled Check A1c Continue Ozempic 0.5 mg weekly Stressed regular exercise, diabetic diet Advised weight loss

## 2022-05-25 NOTE — Assessment & Plan Note (Addendum)
Chronic Last DEXA 2022-due-ordered at her last visit-will schedule Due for injections of Prolia, but was not able to afford, so stopped Can discuss other options after results of dexa

## 2022-06-20 DIAGNOSIS — Z85820 Personal history of malignant melanoma of skin: Secondary | ICD-10-CM | POA: Diagnosis not present

## 2022-06-20 DIAGNOSIS — D2272 Melanocytic nevi of left lower limb, including hip: Secondary | ICD-10-CM | POA: Diagnosis not present

## 2022-06-20 DIAGNOSIS — L821 Other seborrheic keratosis: Secondary | ICD-10-CM | POA: Diagnosis not present

## 2022-06-20 DIAGNOSIS — L570 Actinic keratosis: Secondary | ICD-10-CM | POA: Diagnosis not present

## 2022-06-20 DIAGNOSIS — L814 Other melanin hyperpigmentation: Secondary | ICD-10-CM | POA: Diagnosis not present

## 2022-06-20 DIAGNOSIS — D225 Melanocytic nevi of trunk: Secondary | ICD-10-CM | POA: Diagnosis not present

## 2022-06-20 DIAGNOSIS — Z08 Encounter for follow-up examination after completed treatment for malignant neoplasm: Secondary | ICD-10-CM | POA: Diagnosis not present

## 2022-06-27 ENCOUNTER — Telehealth: Payer: Self-pay

## 2022-06-27 NOTE — Telephone Encounter (Signed)
Patient called today and would like you to order her ozempic.  She states that she is due for a refill

## 2022-06-27 NOTE — Telephone Encounter (Signed)
Patient assistance for Ozempic arrived today.  Patient notified today that medication ready for pick up.

## 2022-06-27 NOTE — Telephone Encounter (Signed)
Spoke with patient today and patient assistance received.

## 2022-06-28 NOTE — Telephone Encounter (Signed)
Patient has picked up Ozempic. 

## 2022-08-13 ENCOUNTER — Telehealth: Payer: Self-pay | Admitting: Internal Medicine

## 2022-08-13 ENCOUNTER — Other Ambulatory Visit: Payer: Self-pay

## 2022-08-13 MED ORDER — ATORVASTATIN CALCIUM 20 MG PO TABS: 20.0000 mg | ORAL_TABLET | Freq: Every evening | ORAL | 3 refills | Status: DC

## 2022-08-13 MED ORDER — LOSARTAN POTASSIUM-HCTZ 100-12.5 MG PO TABS: 1.0000 | ORAL_TABLET | Freq: Every day | ORAL | 3 refills | Status: DC

## 2022-08-13 NOTE — Telephone Encounter (Signed)
Scripts sent today.

## 2022-08-13 NOTE — Telephone Encounter (Signed)
Prescription Request  08/13/2022  LOV: 05/25/2022  What is the name of the medication or equipment? Losartan, atorvastatin  Have you contacted your pharmacy to request a refill? Yes   Which pharmacy would you like this sent to? [facility]   Patient notified that their request is being sent to the clinical staff for review and that they should receive a response within 2 business days.   Please advise at Mobile [phone] (mobile)

## 2022-08-21 NOTE — Telephone Encounter (Signed)
Completed.

## 2022-08-29 ENCOUNTER — Telehealth: Payer: Self-pay

## 2022-08-29 NOTE — Telephone Encounter (Signed)
Patient assistance arrived today.  Called patient and she has been informed medication ready for pick up.

## 2022-09-20 ENCOUNTER — Encounter (INDEPENDENT_AMBULATORY_CARE_PROVIDER_SITE_OTHER): Payer: Self-pay

## 2022-10-10 ENCOUNTER — Ambulatory Visit (INDEPENDENT_AMBULATORY_CARE_PROVIDER_SITE_OTHER)
Admission: RE | Admit: 2022-10-10 | Discharge: 2022-10-10 | Disposition: A | Discharge status: Home / Self Care | Payer: PPO | Source: Ambulatory Visit | Attending: Internal Medicine | Admitting: Internal Medicine

## 2022-10-10 DIAGNOSIS — M816 Localized osteoporosis [Lequesne]: Secondary | ICD-10-CM | POA: Diagnosis not present

## 2022-11-15 DIAGNOSIS — H35371 Puckering of macula, right eye: Secondary | ICD-10-CM | POA: Diagnosis not present

## 2022-11-15 DIAGNOSIS — H04123 Dry eye syndrome of bilateral lacrimal glands: Secondary | ICD-10-CM | POA: Diagnosis not present

## 2022-11-15 DIAGNOSIS — H43813 Vitreous degeneration, bilateral: Secondary | ICD-10-CM | POA: Diagnosis not present

## 2022-11-15 DIAGNOSIS — H40013 Open angle with borderline findings, low risk, bilateral: Secondary | ICD-10-CM | POA: Diagnosis not present

## 2022-11-15 DIAGNOSIS — H524 Presbyopia: Secondary | ICD-10-CM | POA: Diagnosis not present

## 2022-11-15 LAB — HM DIABETES EYE EXAM

## 2022-11-25 ENCOUNTER — Encounter: Payer: Self-pay | Admitting: Internal Medicine

## 2022-11-25 NOTE — Progress Notes (Unsigned)
Subjective:    Patient ID: Susan Holland, female    DOB: September 01, 1945, 77 y.o.   MRN: [id number]     HPI Darricka is here for follow up of her chronic medical problems.  Doing well.    Medications and allergies reviewed with patient and updated if appropriate.  Current Outpatient Medications on File Prior to Visit  Medication Sig Dispense Refill   amoxicillin (AMOXIL) 500 MG tablet Take 2,000 mg by mouth once.     atorvastatin (LIPITOR) 20 MG tablet Take 1 tablet (20 mg total) by mouth every evening. 90 tablet 3   Calcium Carbonate-Vitamin D (CALCIUM + D PO) Take 1 tablet by mouth 2 (two) times daily.      losartan-hydrochlorothiazide (HYZAAR) 100-12.5 MG tablet Take 1 tablet by mouth daily. 90 tablet 3   Multiple Vitamin (MULTIVITAMIN WITH MINERALS) TABS tablet Take 1 tablet by mouth daily.     Omega-3 Fatty Acids (FISH OIL) 1200 MG CAPS Take 1,200 mg by mouth 2 (two) times daily.     Semaglutide,0.25 or 0.5MG /DOS, (OZEMPIC, 0.25 OR 0.5 MG/DOSE,) 2 MG/1.5ML SOPN Inject 0.5 mg into the skin once a week. (Patient taking differently: Inject 0.5 mg into the skin once a week. Patient currently taking 5 mg) 6 mL 1   triamcinolone cream (KENALOG) 0.1 % Apply topically as needed.     No current facility-administered medications on file prior to visit.     Review of Systems  Constitutional:  Negative for fever.  Respiratory:  Negative for cough, shortness of breath and wheezing.   Cardiovascular:  Negative for chest pain, palpitations and leg swelling.  Gastrointestinal:  Positive for constipation. Negative for nausea.       No gerd  Neurological:  Negative for light-headedness and headaches.       Objective:   Vitals:   11/26/22 1051  BP: 128/70  Pulse: 60  Temp: 98 F (36.7 C)  SpO2: 95%   BP Readings from Last 3 Encounters:  11/26/22 128/70  05/25/22 130/74  02/23/22 134/80   Wt Readings from Last 3 Encounters:  11/26/22 175 lb (79.4 kg)  05/25/22 178 lb (80.7  kg)  02/23/22 188 lb (85.3 kg)   Body mass index is 29.12 kg/m.    Physical Exam Constitutional:      General: She is not in acute distress.    Appearance: Normal appearance.  HENT:     Head: Normocephalic and atraumatic.  Eyes:     Conjunctiva/sclera: Conjunctivae normal.  Cardiovascular:     Rate and Rhythm: Normal rate and regular rhythm.     Heart sounds: Normal heart sounds.  Pulmonary:     Effort: Pulmonary effort is normal. No respiratory distress.     Breath sounds: Normal breath sounds. No wheezing.  Musculoskeletal:     Cervical back: Neck supple.     Right lower leg: No edema.     Left lower leg: No edema.  Lymphadenopathy:     Cervical: No cervical adenopathy.  Skin:    General: Skin is warm and dry.     Findings: No rash.  Neurological:     Mental Status: She is alert. Mental status is at baseline.  Psychiatric:        Mood and Affect: Mood normal.        Behavior: Behavior normal.        Lab Results  Component Value Date   WBC 8.4 02/23/2022   HGB 14.0 02/23/2022  HCT 42.2 02/23/2022   PLT 221.0 02/23/2022   GLUCOSE 101 (H) 05/25/2022   CHOL 135 05/25/2022   TRIG 82.0 05/25/2022   HDL 43.10 05/25/2022   LDLDIRECT 159.4 03/19/2011   LDLCALC 75 05/25/2022   ALT 15 05/25/2022   AST 20 05/25/2022   NA 136 05/25/2022   K 4.1 05/25/2022   CL 96 05/25/2022   CREATININE 0.93 05/25/2022   BUN 12 05/25/2022   CO2 30 05/25/2022   TSH 3.00 02/13/2021   INR 1.0 10/08/2018   HGBA1C 6.2 05/25/2022   MICROALBUR 0.9 02/23/2022     Assessment & Plan:    See Problem List for Assessment and Plan of chronic medical problems.

## 2022-11-25 NOTE — Patient Instructions (Addendum)
      Blood work was ordered.   The lab is on the first floor.    Medications changes include :   none      Return in about 6 months (around 05/27/2023) for Physical Exam.

## 2022-11-26 ENCOUNTER — Ambulatory Visit: Payer: PPO | Admitting: Internal Medicine

## 2022-11-26 VITALS — BP 128/70 | HR 60 | Temp 98.0°F | Ht 65.0 in | Wt 175.0 lb

## 2022-11-26 DIAGNOSIS — M816 Localized osteoporosis [Lequesne]: Secondary | ICD-10-CM

## 2022-11-26 DIAGNOSIS — E119 Type 2 diabetes mellitus without complications: Secondary | ICD-10-CM

## 2022-11-26 DIAGNOSIS — Z7985 Long-term (current) use of injectable non-insulin antidiabetic drugs: Secondary | ICD-10-CM | POA: Diagnosis not present

## 2022-11-26 DIAGNOSIS — I10 Essential (primary) hypertension: Secondary | ICD-10-CM | POA: Diagnosis not present

## 2022-11-26 DIAGNOSIS — E782 Mixed hyperlipidemia: Secondary | ICD-10-CM | POA: Diagnosis not present

## 2022-11-26 LAB — CBC WITH DIFFERENTIAL/PLATELET
Basophils Absolute: 0.1 10*3/uL (ref 0.0–0.1)
Basophils Relative: 0.8 % (ref 0.0–3.0)
Eosinophils Absolute: 0 10*3/uL (ref 0.0–0.7)
Eosinophils Relative: 0.3 % (ref 0.0–5.0)
HCT: 42.1 % (ref 36.0–46.0)
Hemoglobin: 13.8 g/dL (ref 12.0–15.0)
Lymphocytes Relative: 23.6 % (ref 12.0–46.0)
Lymphs Abs: 2.3 10*3/uL (ref 0.7–4.0)
MCHC: 32.7 g/dL (ref 30.0–36.0)
MCV: 91.8 fL (ref 78.0–100.0)
Monocytes Absolute: 0.8 10*3/uL (ref 0.1–1.0)
Monocytes Relative: 8.2 % (ref 3.0–12.0)
Neutro Abs: 6.4 10*3/uL (ref 1.4–7.7)
Neutrophils Relative %: 67.1 % (ref 43.0–77.0)
Platelets: 248 10*3/uL (ref 150.0–400.0)
RBC: 4.59 Mil/uL (ref 3.87–5.11)
RDW: 13.5 % (ref 11.5–15.5)
WBC: 9.6 10*3/uL (ref 4.0–10.5)

## 2022-11-26 LAB — COMPREHENSIVE METABOLIC PANEL
ALT: 17 U/L (ref 0–35)
AST: 19 U/L (ref 0–37)
Albumin: 4.4 g/dL (ref 3.5–5.2)
Alkaline Phosphatase: 50 U/L (ref 39–117)
BUN: 12 mg/dL (ref 6–23)
CO2: 31 meq/L (ref 19–32)
Calcium: 10.7 mg/dL — ABNORMAL HIGH (ref 8.4–10.5)
Chloride: 96 meq/L (ref 96–112)
Creatinine, Ser: 0.88 mg/dL (ref 0.40–1.20)
GFR: 63.29 mL/min (ref >60.00)
Glucose, Bld: 97 mg/dL (ref 70–99)
Potassium: 4.2 meq/L (ref 3.5–5.1)
Sodium: 134 meq/L — ABNORMAL LOW (ref 135–145)
Total Bilirubin: 0.6 mg/dL (ref 0.2–1.2)
Total Protein: 7.3 g/dL (ref 6.0–8.3)

## 2022-11-26 LAB — LIPID PANEL
Cholesterol: 127 mg/dL (ref 0–200)
HDL: 45.1 mg/dL (ref >39.00)
LDL Cholesterol: 63 mg/dL (ref 0–99)
NonHDL: 81.62
Total CHOL/HDL Ratio: 3
Triglycerides: 95 mg/dL (ref 0.0–149.0)
VLDL: 19 mg/dL (ref 0.0–40.0)

## 2022-11-26 LAB — HEMOGLOBIN A1C: Hgb A1c MFr Bld: 6.1 % (ref 4.6–6.5)

## 2022-11-26 LAB — VITAMIN D 25 HYDROXY (VIT D DEFICIENCY, FRACTURES): VITD: 43.65 ng/mL (ref 30.00–100.00)

## 2022-11-26 LAB — TSH: TSH: 2.39 u[IU]/mL (ref 0.35–5.50)

## 2022-11-26 NOTE — Assessment & Plan Note (Signed)
Chronic  Lab Results  Component Value Date   HGBA1C 6.2 05/25/2022   Sugars controlled Check A1c Continue Ozempic 0.5 mg weekly Stressed regular exercise, diabetic diet Advised weight loss

## 2022-11-26 NOTE — Assessment & Plan Note (Signed)
Chronic Regular exercise and healthy diet encouraged Check lipid panel, tsh Continue atorvastatin 20 mg daily

## 2022-11-26 NOTE — Assessment & Plan Note (Signed)
Chronic Blood pressure well controlled CMP, cbc Continue losartan-HCTZ 100-12.5 mg daily

## 2022-11-26 NOTE — Assessment & Plan Note (Addendum)
Chronic Last DEXA 2024  Was on Prolia, but was not able to afford the co-pay-there was some improvement in her bone density with Prolia

## 2022-12-25 DIAGNOSIS — Z85820 Personal history of malignant melanoma of skin: Secondary | ICD-10-CM | POA: Diagnosis not present

## 2022-12-25 DIAGNOSIS — L821 Other seborrheic keratosis: Secondary | ICD-10-CM | POA: Diagnosis not present

## 2022-12-25 DIAGNOSIS — L814 Other melanin hyperpigmentation: Secondary | ICD-10-CM | POA: Diagnosis not present

## 2022-12-25 DIAGNOSIS — D225 Melanocytic nevi of trunk: Secondary | ICD-10-CM | POA: Diagnosis not present

## 2022-12-25 DIAGNOSIS — Z08 Encounter for follow-up examination after completed treatment for malignant neoplasm: Secondary | ICD-10-CM | POA: Diagnosis not present

## 2023-01-09 ENCOUNTER — Telehealth: Payer: Self-pay

## 2023-01-09 NOTE — Telephone Encounter (Signed)
Fill and mail out patients portion for [employer] on [employer]

## 2023-01-09 NOTE — Telephone Encounter (Signed)
Pt has not responded to Mychart message regarding re enrollment with [employer] for Ozempic pt assistance

## 2023-02-07 ENCOUNTER — Telehealth: Payer: Self-pay

## 2023-02-07 NOTE — Telephone Encounter (Signed)
 Received pt portion [employer] PAP via onbase waiting on Dr portion.

## 2023-02-12 ENCOUNTER — Encounter: Payer: Self-pay | Admitting: Internal Medicine

## 2023-03-01 ENCOUNTER — Telehealth: Payer: Self-pay | Admitting: Internal Medicine

## 2023-03-01 NOTE — Telephone Encounter (Signed)
Following up on [employer]) per [employer] pt has been APPROVED for 2025, pt will received with in 10-15 days from Dr office once they received they will give pt a call.spoke with pt over the phone informed her she has been approved for 2025 to follow up with doctor office.

## 2023-03-01 NOTE — Telephone Encounter (Signed)
 Copied from CRM [id number]. Topic: Clinical - Medication Question >> Mar 01, 2023 10:37 AM Susan Holland wrote: Reason for CRM: Patient called in to see the status of her ozempic, is requesting a callback

## 2023-03-25 ENCOUNTER — Other Ambulatory Visit: Payer: Self-pay | Admitting: Internal Medicine

## 2023-03-25 MED ORDER — OZEMPIC (0.25 OR 0.5 MG/DOSE) 2 MG/1.5ML ~~LOC~~ SOPN: 0.5000 mg | PEN_INJECTOR | SUBCUTANEOUS | 1 refills | Status: DC

## 2023-03-25 NOTE — Telephone Encounter (Signed)
Copied from CRM [id number]. Topic: Clinical - Medication Refill >> Mar 25, 2023 11:32 AM Florestine Avers wrote: Most Recent Primary Care Visit:  Provider: BURNS, Bobette Mo  Department: [facility]  Visit Type: OFFICE VISIT  Date: 11/26/2022  Medication: Semaglutide,0.25 or 0.5MG /DOS, (OZEMPIC, 0.25 OR 0.5 MG/DOSE,) 2 MG/1.5ML   Has the patient contacted their pharmacy? Yes (Agent: If no, request that the patient contact the pharmacy for the refill. If patient does not wish to contact the pharmacy document the reason why and proceed with request.) (Agent: If yes, when and what did the pharmacy advise?)  Is this the correct pharmacy for this prescription? Yes If no, delete pharmacy and type the correct one.  This is the patient's preferred pharmacy:  [facility] - [place], [place] - [address] [place] [address] Phone: [phone] Fax: [phone]   Has the prescription been filled recently? Yes  Is the patient out of the medication? Yes  Has the patient been seen for an appointment in the last year OR does the patient have an upcoming appointment? Yes  Can we respond through MyChart? Yes  Agent: Please be advised that Rx refills may take up to 3 business days. We ask that you follow-up with your pharmacy.

## 2023-03-27 ENCOUNTER — Other Ambulatory Visit: Payer: Self-pay | Admitting: Internal Medicine

## 2023-03-27 NOTE — Telephone Encounter (Signed)
Copied from CRM [id number]. Topic: Clinical - Medication Refill >> Mar 27, 2023 10:10 AM Alcus Dad wrote: Most Recent Primary Care Visit:  Provider: BURNS, Bobette Mo  Department: [facility]  Visit Type: OFFICE VISIT  Date: 11/26/2022  Medication: Semaglutide,0.25 or 0.5MG /DOS, (OZEMPIC, 0.25 OR 0.5 MG/DOSE,) 2 MG/1.5ML SOPN  Has the patient contacted their pharmacy? Yes (Agent: If no, request that the patient contact the pharmacy for the refill. If patient does not wish to contact the pharmacy document the reason why and proceed with request.) (Agent: If yes, when and what did the pharmacy advise?)  Is this the correct pharmacy for this prescription? Yes If no, delete pharmacy and type the correct one.  This is the patient's preferred pharmacy:  [facility] - [place], [place] - [address] [place] [address] Phone: [phone] Fax: [phone]  [facility] [place], [place] - [address] [place] [address] [place] [address] Phone: [phone] Fax: [phone]  [facility] [place], [place] - [address] [place] [address] Phone: [phone] Fax: [phone]   Has the prescription been filled recently? No  Is the patient out of the medication? Yes  Has the patient been seen for an appointment in the last year OR does the patient have an upcoming appointment? Yes  Can we respond through MyChart? Yes  Agent: Please be advised that Rx refills may take up to 3 business days. We ask that you follow-up with your pharmacy.

## 2023-04-04 ENCOUNTER — Telehealth: Payer: Self-pay

## 2023-04-04 NOTE — Telephone Encounter (Signed)
 Yes it is ok to miss a dose

## 2023-04-04 NOTE — Telephone Encounter (Signed)
 Copied from CRM [id number]. Topic: Clinical - Medical Advice >> Apr 04, 2023 10:15 AM Denese Killings wrote: Reason for CRM: Patient took her last shot last Saturday and is now out. Patient wants to know if it is ok to skip dosage while she waits for her Ozempic to arrive in clinic.

## 2023-04-05 ENCOUNTER — Ambulatory Visit: Payer: PPO

## 2023-04-05 VITALS — Ht 65.0 in | Wt 175.0 lb

## 2023-04-05 DIAGNOSIS — E119 Type 2 diabetes mellitus without complications: Secondary | ICD-10-CM | POA: Diagnosis not present

## 2023-04-05 DIAGNOSIS — Z0000 Encounter for general adult medical examination without abnormal findings: Secondary | ICD-10-CM | POA: Diagnosis not present

## 2023-04-05 NOTE — Patient Instructions (Signed)
 Susan Holland , Thank you for taking time to come for your Medicare Wellness Visit. I appreciate your ongoing commitment to your health goals. Please review the following plan we discussed and let me know if I can assist you in the future.   Referrals/Orders/Follow-Ups/Clinician Recommendations: It was nice talking to you today.  You are due for a Tdap.  Each day, aim for 6 glasses of water, plenty of protein in your diet and try to get up and walk/ stretch every hour for 5-10 minutes at a time.    This is a list of the screening recommended for you and due dates:  Health Maintenance  Topic Date Due   DTaP/Tdap/Td vaccine (2 - Tdap) 03/17/2020   Complete foot exam   02/13/2022   Yearly kidney health urinalysis for diabetes  02/24/2023   Hemoglobin A1C  05/27/2023   Eye exam for diabetics  11/15/2023   Yearly kidney function blood test for diabetes  11/26/2023   Medicare Annual Wellness Visit  04/04/2024   Colon Cancer Screening  05/31/2024   DEXA scan (bone density measurement)  10/09/2024   Pneumonia Vaccine  Completed   Flu Shot  Completed   Hepatitis C Screening  Completed   Zoster (Shingles) Vaccine  Completed   HPV Vaccine  Aged Out   COVID-19 Vaccine  Discontinued    Advanced directives: (Copy Requested) Please bring a copy of your health care power of attorney and living will to the office to be added to your chart at your convenience.  Next Medicare Annual Wellness Visit scheduled for next year: Yes

## 2023-04-05 NOTE — Progress Notes (Signed)
 Subjective:   Susan Holland is a 78 y.o. who presents for a Medicare Wellness preventive visit.  Visit Complete: Virtual I connected with  Susan Holland on 04/05/23 by a audio enabled telemedicine application and verified that I am speaking with the correct person using two identifiers.  Patient Location: Home  Provider Location: Home Office  I discussed the limitations of evaluation and management by telemedicine. The patient expressed understanding and agreed to proceed.  Vital Signs: Because this visit was a virtual/telehealth visit, some criteria may be missing or patient reported. Any vitals not documented were not able to be obtained and vitals that have been documented are patient reported.  VideoDeclined- This patient declined Librarian, academic. Therefore the visit was completed with audio only.  AWV Questionnaire: No: Patient Medicare AWV questionnaire was not completed prior to this visit.  Cardiac Risk Factors include: advanced age (>60men, >73 women);hypertension;diabetes mellitus;dyslipidemia     Objective:    Today's Vitals   04/05/23 0812  Weight: 175 lb (79.4 kg)  Height: 5\' 5"  (1.651 m)   Body mass index is 29.12 kg/m.     04/05/2023    8:18 AM 10/18/2021    9:50 AM 01/03/2021    8:03 AM 01/02/2021    8:24 PM 10/17/2020   12:11 PM 10/14/2018    7:57 AM 10/08/2018    9:16 AM  Advanced Directives  Does Patient Have a Medical Advance Directive? Yes Yes No Yes Yes No No  Type of Estate agent of [place];Living will Healthcare Power of [place];Living will  Healthcare Power of [place];Living will Living will;Healthcare Power of Attorney    Does patient want to make changes to medical advance directive?     No - Patient declined    Copy of Healthcare Power of Attorney in Chart? No - copy requested No - copy requested   No - copy requested    Would patient like information on creating a medical advance  directive?   No - Patient declined   No - Patient declined No - Patient declined    Current Medications (verified) Outpatient Encounter Medications as of 04/05/2023  Medication Sig   amoxicillin (AMOXIL) 500 MG tablet Take 2,000 mg by mouth once.   atorvastatin (LIPITOR) 20 MG tablet Take 1 tablet (20 mg total) by mouth every evening.   Calcium Carbonate-Vitamin D (CALCIUM + D PO) Take 1 tablet by mouth 2 (two) times daily.    losartan-hydrochlorothiazide (HYZAAR) 100-12.5 MG tablet Take 1 tablet by mouth daily.   Multiple Vitamin (MULTIVITAMIN WITH MINERALS) TABS tablet Take 1 tablet by mouth daily.   Omega-3 Fatty Acids (FISH OIL) 1200 MG CAPS Take 1,200 mg by mouth 2 (two) times daily.   Semaglutide,0.25 or 0.5MG /DOS, (OZEMPIC, 0.25 OR 0.5 MG/DOSE,) 2 MG/1.5ML SOPN Inject 0.5 mg into the skin once a week.   triamcinolone cream (KENALOG) 0.1 % Apply topically as needed.   No facility-administered encounter medications on file as of 04/05/2023.    Allergies (verified) Patient has no known allergies.   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 ; benign   TONSILLECTOMY AND ADENOIDECTOMY     TOTAL HIP ARTHROPLASTY Right 10/14/2018   Procedure: RIght Anterior Hip Arthroplasty;  Surgeon: Marcene Corning, MD;  Location: [facility];  Service: Orthopedics;  Laterality: Right;   TUBAL LIGATION     Family History  Problem Relation Age of Onset   Dementia Mother        Alsheimer's   Stroke Father 74   Diabetes Sister    Heart disease Sister        AF   Colon cancer  Maternal Uncle        X 2   Esophageal cancer Neg Hx    Rectal cancer Neg Hx    Stomach cancer Neg Hx    Social History   Socioeconomic History   Marital status: Widowed    Spouse name: Not on file   Number of children: 2   Years of education: Not on file   Highest education level: Not on file  Occupational History   Occupation: retired  Tobacco Use   Smoking status: Former    Current packs/day: 0.00    Types: Cigarettes    Quit date: 03/29/2015    Years since quitting: 8.0    Passive exposure: Never   Smokeless tobacco: Never   Tobacco comments:    smoked 1963- present, up to 1/2 ppd  Vaping Use   Vaping status: Never Used  Substance and Sexual Activity   Alcohol use: Yes    Alcohol/week: 0.0 standard drinks of alcohol    Comment:  occasionally    Drug use: No   Sexual activity: Never  Other Topics Concern   Not on file  Social History Narrative   Exercise:  Does some walking, but irregularly   Lives alone-2025   Social Drivers of Health   Financial Resource Strain: Low Risk  (10/18/2021)   Overall Financial Resource Strain (CARDIA)    Difficulty of Paying Living Expenses: Not hard at all  Food Insecurity: No Food Insecurity (10/18/2021)   Hunger Vital Sign    Worried About Running Out of Food in the Last Year: Never true    Ran Out of Food in the Last Year: Never true  Transportation Needs: No Transportation Needs (04/05/2023)   PRAPARE - Administrator, Civil Service (Medical): No    Lack of Transportation (Non-Medical): No  Physical Activity: Inactive (04/05/2023)   Exercise Vital Sign    Days of Exercise per Week: 0 days    Minutes of Exercise per Session: 0 min  Stress: No Stress Concern Present (04/05/2023)   [employer] of Occupational Health - Occupational Stress Questionnaire    Feeling of Stress : Not at all  Social Connections: Moderately Integrated (04/05/2023)   Social Connection and Isolation Panel [NHANES]    Frequency of  Communication with Friends and Family: More than three times a week    Frequency of Social Gatherings with Friends and Family: Twice a week    Attends Religious Services: More than 4 times per year    Active Member of [employer] or Organizations: Yes    Attends Banker Meetings: 1 to 4 times per year    Marital Status: Widowed    Tobacco Counseling Counseling given: Not Answered Tobacco comments: smoked 1963- present, up to 1/2 ppd    Clinical  Intake:  Pre-visit preparation completed: Yes  Pain : No/denies pain     BMI - recorded: 29.12 Nutritional Status: BMI 25 -29 Overweight Nutritional Risks: None Diabetes: Yes CBG done?: No Did pt. bring in CBG monitor from home?: No  How often do you need to have someone help you when you read instructions, pamphlets, or other written materials from your doctor or pharmacy?: 1 - Never  Interpreter Needed?: No  Information entered by :: Man Bonneau, RMA   Activities of Daily Living     04/05/2023    8:16 AM  In your present state of health, do you have any difficulty performing the following activities:  Hearing? 0  Vision? 0  Difficulty concentrating or making decisions? 0  Walking or climbing stairs? 0  Dressing or bathing? 0  Doing errands, shopping? 0  Preparing Food and eating ? N  Using the Toilet? N  In the past six months, have you accidently leaked urine? N  Do you have problems with loss of bowel control? N  Managing your Medications? N  Managing your Finances? N  Housekeeping or managing your Housekeeping? N    Patient Care Team: Pincus Sanes, MD as PCP - General (Internal Medicine) Huel Cote, MD as Consulting Physician (Obstetrics and Gynecology) Mateo Flow, MD as Consulting Physician (Ophthalmology)  Indicate any recent Medical Services you may have received from other than [facility] providers in the past year (date may be approximate).     Assessment:   This is a routine wellness  examination for Ashyra.  Hearing/Vision screen Hearing Screening - Comments:: Denies hearing difficulties   Vision Screening - Comments:: Wears eyeglasses   Goals Addressed             This Visit's Progress    My goal is to be more physically active.   On track    Patient Stated   On track    I will begin to walk at the church 2-3 times weekly. Watch what I eat        Depression Screen     04/05/2023    8:24 AM 05/25/2022   11:27 AM 02/23/2022   10:50 AM 10/18/2021    9:53 AM 10/17/2020   12:06 PM 02/09/2020   10:53 AM 08/15/2018    8:55 AM  PHQ 2/9 Scores  PHQ - 2 Score 0 0 0 0 0 0 0  PHQ- 9 Score 0 2 3        Fall Risk     04/05/2023    8:18 AM 05/25/2022   11:27 AM 02/23/2022   10:50 AM 10/18/2021    9:51 AM 10/17/2020   12:12 PM  Fall Risk   Falls in the past year? 0 0 0 0 0  Number falls in past yr: 0 0 0 0 0  Injury with Fall? 0 0 0 0 0  Risk for fall due to : No Fall Risks No Fall Risks No Fall Risks No Fall Risks No Fall Risks  Follow up Falls prevention discussed;Falls evaluation completed Falls evaluation completed Falls evaluation completed Falls prevention discussed Falls evaluation completed    MEDICARE RISK AT HOME:  Medicare Risk at Home Any stairs in or around the home?: Yes If so, are there any without handrails?: Yes Home free of loose throw rugs in walkways, pet beds, electrical cords, etc?: Yes Adequate lighting in your home to reduce risk of falls?: Yes Life alert?: Yes Use of a cane, walker or w/c?: No  Grab bars in the bathroom?: No Shower chair or bench in shower?: No Elevated toilet seat or a handicapped toilet?: No  TIMED UP AND GO:  Was the test performed?  No  Cognitive Function: 6CIT completed    06/05/2017    8:36 AM  MMSE - Mini Mental State Exam  Orientation to time 5  Orientation to Place 5  Registration 3  Attention/ Calculation 3  Recall 1  Language- name 2 objects 2  Language- repeat 1  Language- follow 3 step command 3   Language- read & follow direction 1  Write a sentence 1  Copy design 1  Total score 26        04/05/2023    8:19 AM 10/18/2021   10:03 AM  6CIT Screen  What Year? 0 points 0 points  What month? 0 points 0 points  What time? 0 points 0 points  Count back from 20 0 points 0 points  Months in reverse 0 points 0 points  Repeat phrase 4 points 0 points  Total Score 4 points 0 points    Immunizations Immunization History  Administered Date(s) Administered   Fluad Quad(high Dose 65+) 11/21/2018, 10/17/2020, 11/07/2022   Influenza Whole 10/29/2007   Influenza, High Dose Seasonal PF 11/14/2016, 11/06/2017, 11/26/2019, 11/14/2021   Influenza,inj,Quad PF,6+ Mos 11/14/2012   Influenza-Unspecified 11/04/2013, 12/16/2014, 11/10/2015   PFIZER(Purple Top)SARS-COV-2 Vaccination 03/21/2019, 04/15/2019, 01/14/2020   Pneumococcal Conjugate-13 10/05/2014   Pneumococcal Polysaccharide-23 03/19/2011   Td 03/17/2010   Zoster Recombinant(Shingrix) 06/10/2017, 09/05/2017   Zoster, Live 07/02/2006    Screening Tests Health Maintenance  Topic Date Due   DTaP/Tdap/Td (2 - Tdap) 03/17/2020   FOOT EXAM  02/13/2022   Diabetic kidney evaluation - Urine ACR  02/24/2023   HEMOGLOBIN A1C  05/27/2023   OPHTHALMOLOGY EXAM  11/15/2023   Diabetic kidney evaluation - eGFR measurement  11/26/2023   Medicare Annual Wellness (AWV)  04/04/2024   Colonoscopy  05/31/2024   DEXA SCAN  10/09/2024   Pneumonia Vaccine 68+ Years old  Completed   INFLUENZA VACCINE  Completed   Hepatitis C Screening  Completed   Zoster Vaccines- Shingrix  Completed   HPV VACCINES  Aged Out   COVID-19 Vaccine  Discontinued    Health Maintenance  Health Maintenance Due  Topic Date Due   DTaP/Tdap/Td (2 - Tdap) 03/17/2020   FOOT EXAM  02/13/2022   Diabetic kidney evaluation - Urine ACR  02/24/2023   Health Maintenance Items Addressed: Due for a Tdap.  Additional Screening:  Vision Screening: Recommended annual  ophthalmology exams for early detection of glaucoma and other disorders of the eye.  Dental Screening: Recommended annual dental exams for proper oral hygiene  Community Resource Referral / Chronic Care Management: CRR required this visit?  No   CCM required this visit?  No     Plan:     I have personally reviewed and noted the following in the patient's chart:   Medical and social history Use of alcohol, tobacco or illicit drugs  Current medications and supplements including opioid prescriptions. Patient is not currently taking opioid prescriptions. Functional ability and status Nutritional status Physical activity Advanced directives List of other physicians Hospitalizations, surgeries, and ER visits in previous 12 months Vitals Screenings to include cognitive, depression, and falls Referrals and appointments  In addition, I have reviewed and discussed with patient certain preventive protocols, quality metrics, and best practice recommendations. A written personalized care plan for preventive services as well as general preventive health recommendations  were provided to patient.     Lua Feng L Apryle Stowell, CMA   04/05/2023   After Visit Summary: (MyChart) Due to this being a telephonic visit, the after visit summary with patients personalized plan was offered to patient via MyChart   Notes: Please refer to Routing Comments.

## 2023-04-08 NOTE — Telephone Encounter (Signed)
 Attempted to call patient and had to leave a voice message for call back.

## 2023-05-26 ENCOUNTER — Encounter: Payer: Self-pay | Admitting: Internal Medicine

## 2023-05-26 NOTE — Patient Instructions (Addendum)
      Blood work was ordered.       Medications changes include :   None      Return in about 6 months (around 11/26/2023) for Physical Exam.

## 2023-05-26 NOTE — Progress Notes (Unsigned)
 Subjective:    Patient ID: Susan Holland, female    DOB: [birth year], 78 y.o.   MRN: [id number]     HPI Susan Holland is here for follow up of her chronic medical problems.  No currently exercising.  She does not cook.    Off ozempic  x 6 weeks - gets pt assistance  Medications and allergies reviewed with patient and updated if appropriate.  Current Outpatient Medications on File Prior to Visit  Medication Sig Dispense Refill   amoxicillin (AMOXIL) 500 MG tablet Take 2,000 mg by mouth once.     atorvastatin  (LIPITOR) 20 MG tablet Take 1 tablet (20 mg total) by mouth every evening. 90 tablet 3   Calcium  Carbonate-Vitamin D  (CALCIUM  + D PO) Take 1 tablet by mouth 2 (two) times daily.      losartan -hydrochlorothiazide  (HYZAAR) 100-12.5 MG tablet Take 1 tablet by mouth daily. 90 tablet 3   Multiple Vitamin (MULTIVITAMIN WITH MINERALS) TABS tablet Take 1 tablet by mouth daily.     Omega-3 Fatty Acids (FISH OIL) 1200 MG CAPS Take 1,200 mg by mouth 2 (two) times daily.     Semaglutide ,0.25 or 0.5MG /DOS, (OZEMPIC , 0.25 OR 0.5 MG/DOSE,) 2 MG/1.5ML SOPN Inject 0.5 mg into the skin once a week. 6 mL 1   triamcinolone  cream (KENALOG ) 0.1 % Apply topically as needed.     No current facility-administered medications on file prior to visit.     Review of Systems  Constitutional:  Negative for fever.  Respiratory:  Negative for cough, shortness of breath and wheezing.   Cardiovascular:  Negative for chest pain, palpitations and leg swelling.  Neurological:  Negative for light-headedness and headaches.  Psychiatric/Behavioral:  Negative for dysphoric mood and sleep disturbance. The patient is not nervous/anxious.        Objective:   Vitals:   05/27/23 1050  BP: 124/78  Pulse: 63  Temp: 98.3 F (36.8 C)  SpO2: 96%   BP Readings from Last 3 Encounters:  05/27/23 124/78  11/26/22 128/70  05/25/22 130/74   Wt Readings from Last 3 Encounters:  05/27/23 177 lb (80.3 kg)  04/05/23  175 lb (79.4 kg)  11/26/22 175 lb (79.4 kg)   Body mass index is 29.45 kg/m.    Physical Exam Constitutional:      General: She is not in acute distress.    Appearance: Normal appearance.  HENT:     Head: Normocephalic and atraumatic.  Eyes:     Conjunctiva/sclera: Conjunctivae normal.  Cardiovascular:     Rate and Rhythm: Normal rate and regular rhythm.     Heart sounds: Normal heart sounds.  Pulmonary:     Effort: Pulmonary effort is normal. No respiratory distress.     Breath sounds: Normal breath sounds. No wheezing.  Musculoskeletal:     Cervical back: Neck supple.     Right lower leg: No edema.     Left lower leg: No edema.  Lymphadenopathy:     Cervical: No cervical adenopathy.  Skin:    General: Skin is warm and dry.     Findings: No rash.  Neurological:     Mental Status: She is alert. Mental status is at baseline.  Psychiatric:        Mood and Affect: Mood normal.        Behavior: Behavior normal.       Diabetic Foot Exam - Simple   Simple Foot Form Diabetic Foot exam was performed with the following findings: Yes  05/27/2023 11:19 AM  Visual Inspection No deformities, no ulcerations, no other skin breakdown bilaterally: Yes Sensation Testing Intact to touch and monofilament testing bilaterally: Yes Pulse Check Posterior Tibialis and Dorsalis pulse intact bilaterally: Yes Comments      Lab Results  Component Value Date   WBC 9.6 11/26/2022   HGB 13.8 11/26/2022   HCT 42.1 11/26/2022   PLT 248.0 11/26/2022   GLUCOSE 97 11/26/2022   CHOL 127 11/26/2022   TRIG 95.0 11/26/2022   HDL 45.10 11/26/2022   LDLDIRECT 159.4 03/19/2011   LDLCALC 63 11/26/2022   ALT 17 11/26/2022   AST 19 11/26/2022   NA 134 (L) 11/26/2022   K 4.2 11/26/2022   CL 96 11/26/2022   CREATININE 0.88 11/26/2022   BUN 12 11/26/2022   CO2 31 11/26/2022   TSH 2.39 11/26/2022   INR 1.0 10/08/2018   HGBA1C 6.1 11/26/2022   MICROALBUR 0.9 02/23/2022     Assessment & Plan:     See Problem List for Assessment and Plan of chronic medical problems.

## 2023-05-27 ENCOUNTER — Ambulatory Visit (INDEPENDENT_AMBULATORY_CARE_PROVIDER_SITE_OTHER): Payer: PPO | Admitting: Internal Medicine

## 2023-05-27 VITALS — BP 124/78 | HR 63 | Temp 98.3°F | Ht 65.0 in | Wt 177.0 lb

## 2023-05-27 DIAGNOSIS — E782 Mixed hyperlipidemia: Secondary | ICD-10-CM | POA: Diagnosis not present

## 2023-05-27 DIAGNOSIS — Z7985 Long-term (current) use of injectable non-insulin antidiabetic drugs: Secondary | ICD-10-CM | POA: Diagnosis not present

## 2023-05-27 DIAGNOSIS — M810 Age-related osteoporosis without current pathological fracture: Secondary | ICD-10-CM | POA: Diagnosis not present

## 2023-05-27 DIAGNOSIS — E1169 Type 2 diabetes mellitus with other specified complication: Secondary | ICD-10-CM | POA: Diagnosis not present

## 2023-05-27 DIAGNOSIS — E119 Type 2 diabetes mellitus without complications: Secondary | ICD-10-CM

## 2023-05-27 DIAGNOSIS — I10 Essential (primary) hypertension: Secondary | ICD-10-CM

## 2023-05-27 LAB — LIPID PANEL
Cholesterol: 137 mg/dL (ref 0–200)
HDL: 48.1 mg/dL (ref >39.00)
LDL Cholesterol: 71 mg/dL (ref 0–99)
NonHDL: 88.53
Total CHOL/HDL Ratio: 3
Triglycerides: 87 mg/dL (ref 0.0–149.0)
VLDL: 17.4 mg/dL (ref 0.0–40.0)

## 2023-05-27 LAB — COMPREHENSIVE METABOLIC PANEL WITH GFR
ALT: 19 U/L (ref 0–35)
AST: 24 U/L (ref 0–37)
Albumin: 4.5 g/dL (ref 3.5–5.2)
Alkaline Phosphatase: 52 U/L (ref 39–117)
BUN: 11 mg/dL (ref 6–23)
CO2: 30 meq/L (ref 19–32)
Calcium: 10.5 mg/dL (ref 8.4–10.5)
Chloride: 98 meq/L (ref 96–112)
Creatinine, Ser: 0.82 mg/dL (ref 0.40–1.20)
GFR: 68.64 mL/min (ref >60.00)
Glucose, Bld: 110 mg/dL — ABNORMAL HIGH (ref 70–99)
Potassium: 4 meq/L (ref 3.5–5.1)
Sodium: 135 meq/L (ref 135–145)
Total Bilirubin: 0.7 mg/dL (ref 0.2–1.2)
Total Protein: 7.3 g/dL (ref 6.0–8.3)

## 2023-05-27 LAB — HEMOGLOBIN A1C: Hgb A1c MFr Bld: 6.2 % (ref 4.6–6.5)

## 2023-05-27 LAB — VITAMIN D 25 HYDROXY (VIT D DEFICIENCY, FRACTURES): VITD: 57.68 ng/mL (ref 30.00–100.00)

## 2023-05-27 NOTE — Assessment & Plan Note (Signed)
 Chronic Last DEXA 2024-recheck next year Was on Prolia , but was not able to afford the co-pay-there was some improvement in her bone density with Prolia  Encouraged regular exercise Continue calcium  and vitamin D 

## 2023-05-27 NOTE — Assessment & Plan Note (Signed)
Chronic °Blood pressure well controlled °CMP °Continue losartan-HCTZ 100-12.5 mg daily °

## 2023-05-27 NOTE — Assessment & Plan Note (Signed)
Chronic °Regular exercise and healthy diet encouraged °Check lipid panel  °Continue atorvastatin 20 mg daily °

## 2023-05-27 NOTE — Assessment & Plan Note (Addendum)
 Chronic With hyperlipidemia  Lab Results  Component Value Date   HGBA1C 6.1 11/26/2022   Sugars controlled Check A1c, urine microalbumin/ cr Continue Ozempic  0.5 mg weekly - needs to get pt assistance done again - has been off on 6 weeks Stressed regular exercise, diabetic diet Advised weight loss

## 2023-05-28 ENCOUNTER — Encounter: Payer: Self-pay | Admitting: Internal Medicine

## 2023-05-28 DIAGNOSIS — Z01419 Encounter for gynecological examination (general) (routine) without abnormal findings: Secondary | ICD-10-CM | POA: Diagnosis not present

## 2023-05-28 DIAGNOSIS — Z1231 Encounter for screening mammogram for malignant neoplasm of breast: Secondary | ICD-10-CM | POA: Diagnosis not present

## 2023-05-28 LAB — MICROALBUMIN / CREATININE URINE RATIO
Creatinine,U: 57 mg/dL
Microalb Creat Ratio: UNDETERMINED mg/g (ref 0.0–30.0)
Microalb, Ur: <0.7 mg/dL

## 2023-06-27 DIAGNOSIS — L814 Other melanin hyperpigmentation: Secondary | ICD-10-CM | POA: Diagnosis not present

## 2023-06-27 DIAGNOSIS — D492 Neoplasm of unspecified behavior of bone, soft tissue, and skin: Secondary | ICD-10-CM | POA: Diagnosis not present

## 2023-06-27 DIAGNOSIS — Z08 Encounter for follow-up examination after completed treatment for malignant neoplasm: Secondary | ICD-10-CM | POA: Diagnosis not present

## 2023-06-27 DIAGNOSIS — L578 Other skin changes due to chronic exposure to nonionizing radiation: Secondary | ICD-10-CM | POA: Diagnosis not present

## 2023-06-27 DIAGNOSIS — Z85820 Personal history of malignant melanoma of skin: Secondary | ICD-10-CM | POA: Diagnosis not present

## 2023-06-27 DIAGNOSIS — D225 Melanocytic nevi of trunk: Secondary | ICD-10-CM | POA: Diagnosis not present

## 2023-06-27 DIAGNOSIS — L821 Other seborrheic keratosis: Secondary | ICD-10-CM | POA: Diagnosis not present

## 2023-06-27 DIAGNOSIS — D0461 Carcinoma in situ of skin of right upper limb, including shoulder: Secondary | ICD-10-CM | POA: Diagnosis not present

## 2023-07-10 ENCOUNTER — Encounter: Payer: Self-pay | Admitting: Internal Medicine

## 2023-07-15 IMAGING — CT CT HEAD W/O CM
3 of 4 series · 17 of 40 positions shown, 19 images · non-contrast
Comparison: None.

CLINICAL DATA: Orthostatic with recent diagnosis of COVID positive.

EXAM:
CT HEAD WITHOUT CONTRAST
TECHNIQUE: Contiguous axial images were obtained from the base of the skull
through the vertex without intravenous contrast.

[Series 3: head without · axial · non-contrast · 0.44mm/px · z∈[-169,-49]mm · 7 of 32 slices shown, 9 images]
[im 4/32  brain]
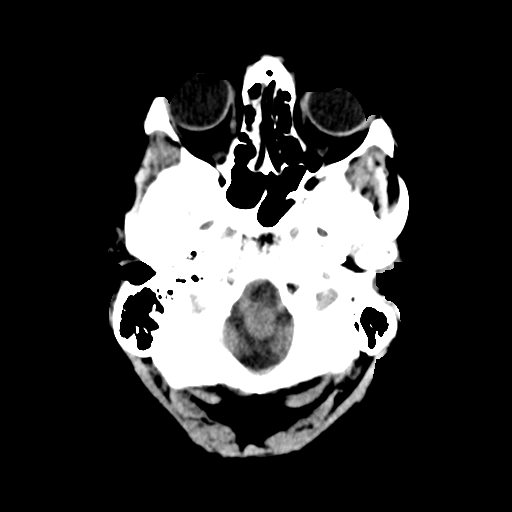
[im 4/32  bone]
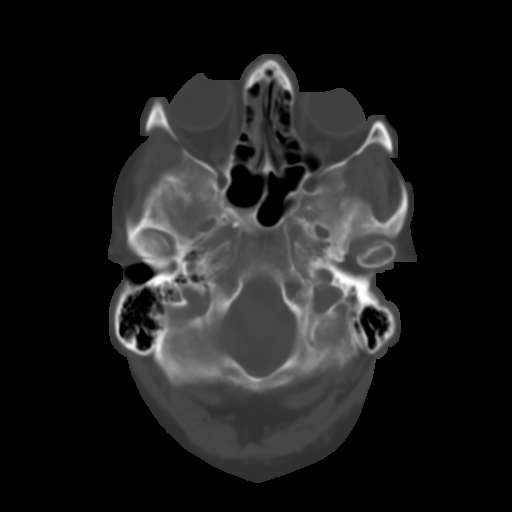
[im 8/32  brain]
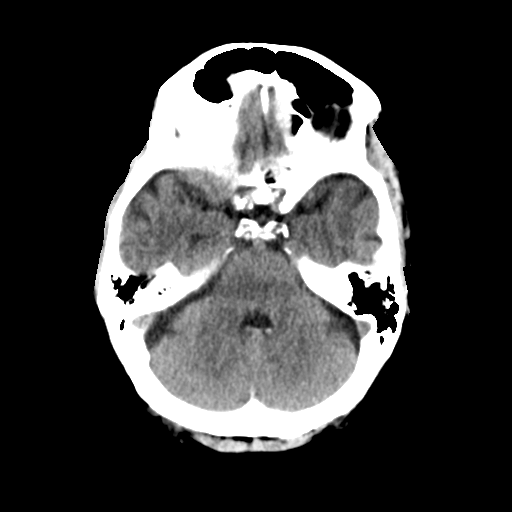
[im 12/32  brain]
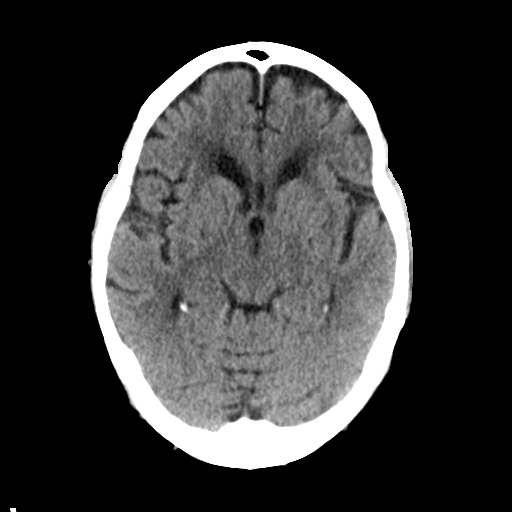
[im 16/32  brain]
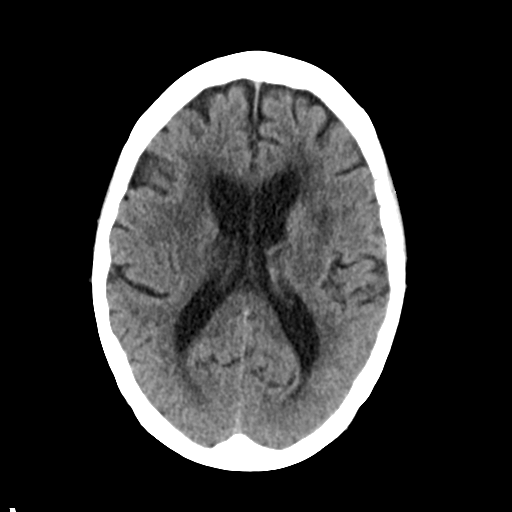
[im 20/32  brain]
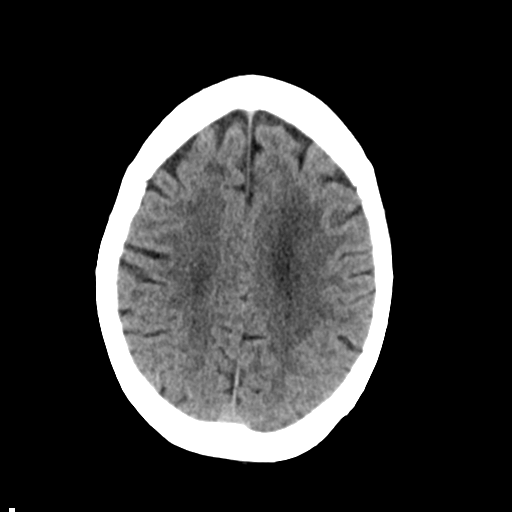
[im 20/32  bone]
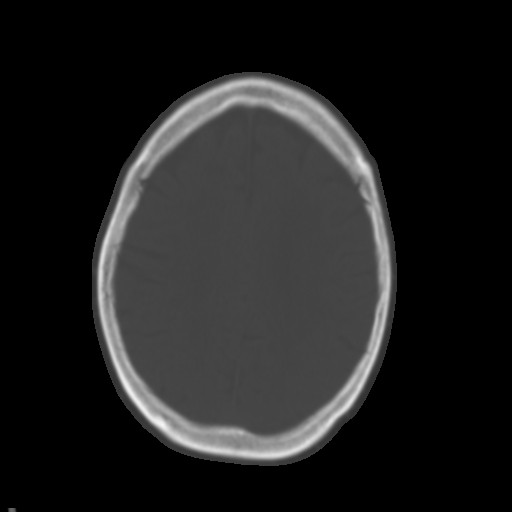
[im 24/32  brain]
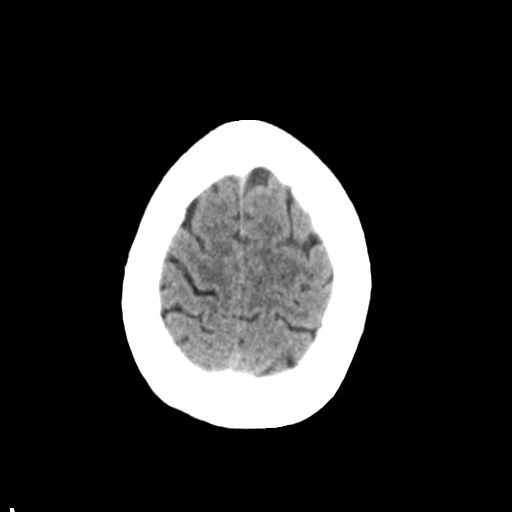
[im 28/32  brain]
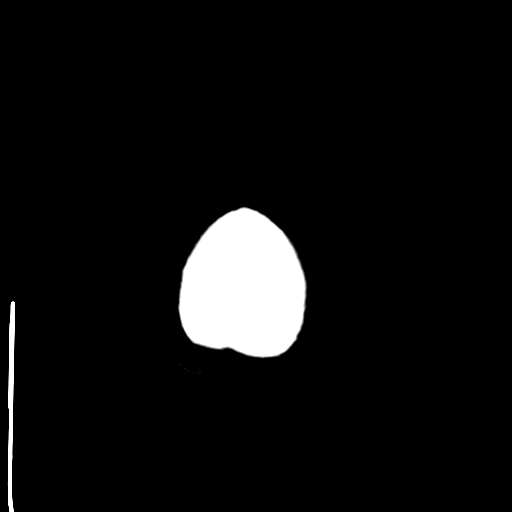

[Series 4: head bone · axial · 0.44mm/px · z∈[-170,-60]mm · 7 of 79 slices shown]
[im 8/79  bone]
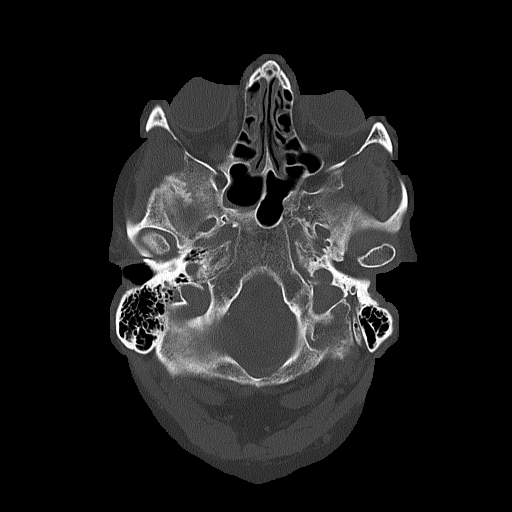
[im 16/79  bone]
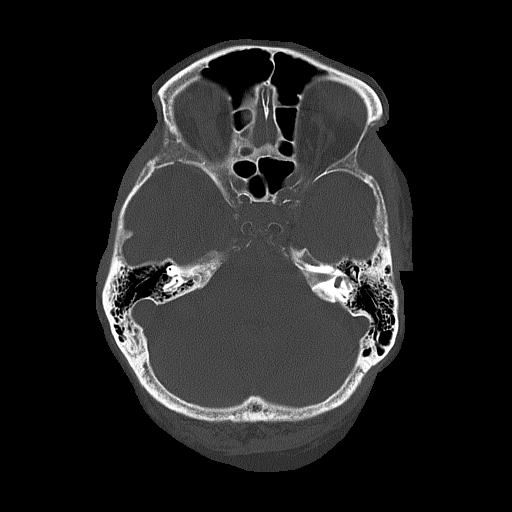
[im 24/79  bone]
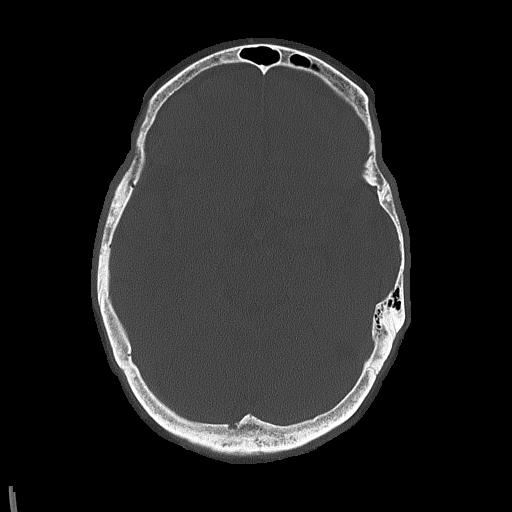
[im 36/79  bone]
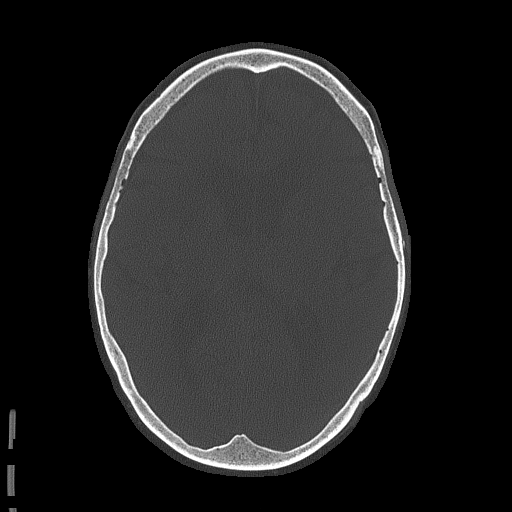
[im 43/79  bone]
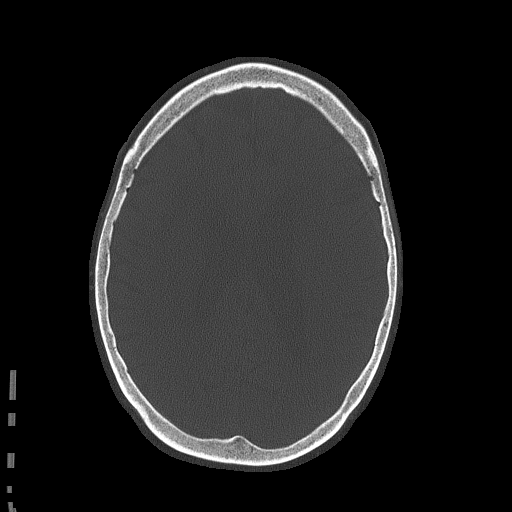
[im 55/79  bone]
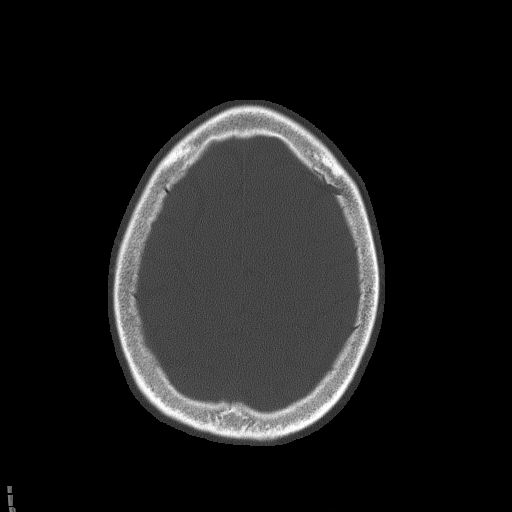
[im 63/79  bone]
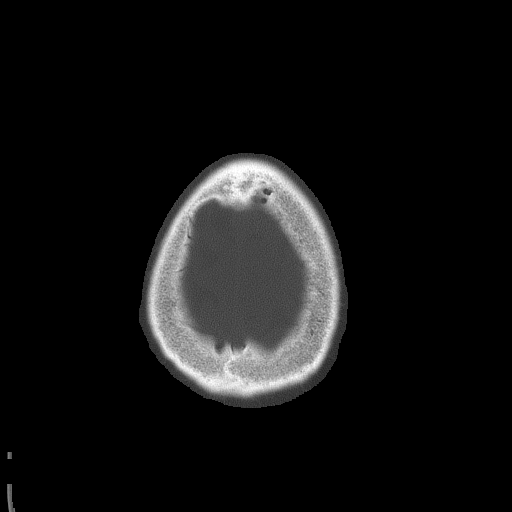

[Series 5: head without cor · coronal · non-contrast · 0.32mm/px · 3 of 67 slices shown]
[im 17/67  brain]
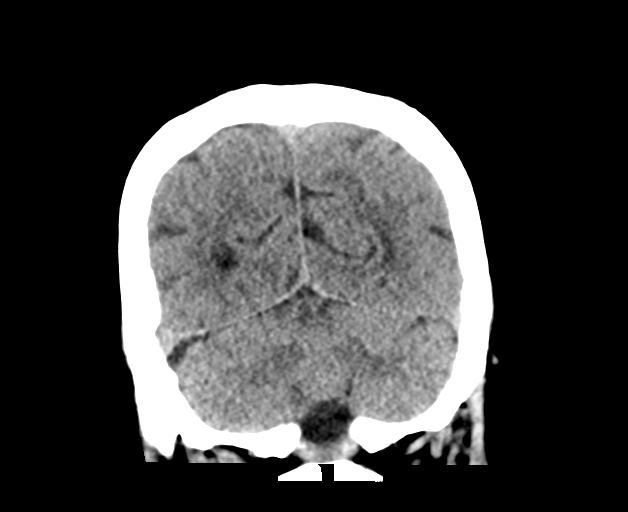
[im 34/67  brain]
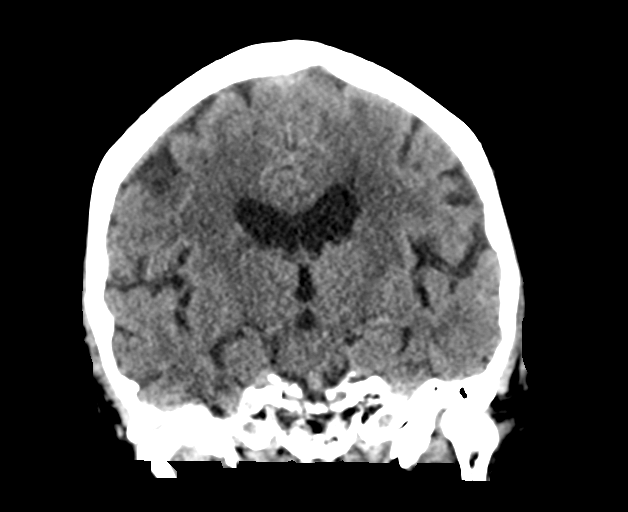
[im 50/67  brain]
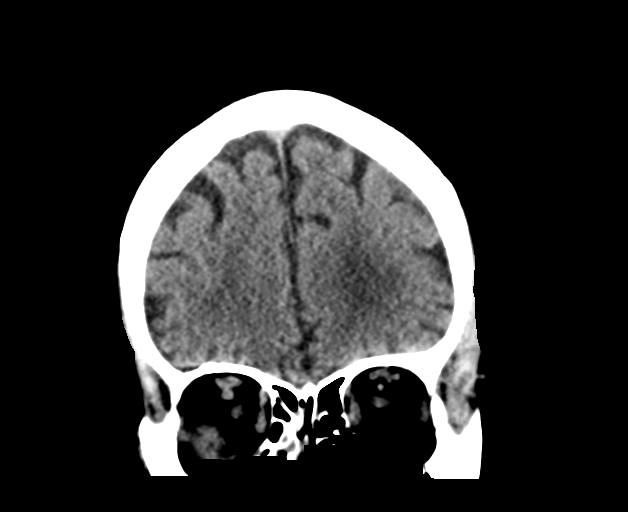

[17 of 40 positions shown; findings below may reference images not displayed]

FINDINGS: Brain: There is mild cerebral atrophy with widening of the
extra-axial spaces and ventricular dilatation.
There are areas of decreased attenuation within the white matter
tracts of the supratentorial brain, consistent with microvascular
disease changes.

Very small chronic left basal ganglia lacunar infarcts are seen.

Vascular: No hyperdense vessel or unexpected calcification.

Skull: Normal. Negative for fracture or focal lesion.

Sinuses/Orbits: There is moderate severity right ethmoid sinus Leiline Mne
thickening. A small air-fluid level is seen within the sphenoid
sinus.

Other: None.
IMPRESSION: 1. Generalized cerebral atrophy.
2. No acute intracranial abnormality.

## 2023-07-17 ENCOUNTER — Telehealth: Payer: Self-pay

## 2023-07-17 ENCOUNTER — Other Ambulatory Visit (HOSPITAL_COMMUNITY): Payer: Self-pay

## 2023-07-17 MED ORDER — TIRZEPATIDE 2.5 MG/0.5ML ~~LOC~~ SOAJ: 2.5000 mg | SUBCUTANEOUS | 0 refills | Status: DC

## 2023-07-17 NOTE — Telephone Encounter (Signed)
 Pharmacy Patient Advocate Encounter  Received notification from [employer] that Prior Authorization for Mounjaro 2.5 has been APPROVED from 07/17/23 to 07/16/24. Ran test claim, Copay is $47.00. This test claim was processed through [facility]- copay amounts may vary at other pharmacies due to pharmacy/plan contracts, or as the patient moves through the different stages of their insurance plan.   PA #/Case ID/Reference #: [id number]

## 2023-07-17 NOTE — Telephone Encounter (Signed)
 Pharmacy Patient Advocate Encounter   Received notification from Patient Pharmacy that prior authorization for Mounjaro 2.5 is required/requested.   Insurance verification completed.   The patient is insured through [facility] ADVANTAGE/RX ADVANCE .   Per test claim: PA required; PA submitted to above mentioned insurance via CoverMyMeds Key/confirmation #/EOC [id number] Status is pending

## 2023-07-27 MED ORDER — TIRZEPATIDE 2.5 MG/0.5ML ~~LOC~~ SOAJ: 2.5000 mg | SUBCUTANEOUS | 0 refills | Status: DC

## 2023-07-27 NOTE — Addendum Note (Signed)
 Addended by: GEOFM GLADE PARAS on: 07/27/2023 01:46 PM   Modules accepted: Orders

## 2023-08-13 DIAGNOSIS — D0461 Carcinoma in situ of skin of right upper limb, including shoulder: Secondary | ICD-10-CM | POA: Diagnosis not present

## 2023-08-20 ENCOUNTER — Other Ambulatory Visit: Payer: Self-pay | Admitting: Internal Medicine

## 2023-08-20 MED ORDER — LOSARTAN POTASSIUM-HCTZ 100-12.5 MG PO TABS: 1.0000 | ORAL_TABLET | Freq: Every day | ORAL | 3 refills | Status: DC

## 2023-08-20 NOTE — Telephone Encounter (Unsigned)
 Copied from CRM [id number]. Topic: Clinical - Medication Refill >> Aug 20, 2023 11:56 AM Mercedes MATSU wrote: Medication: atorvastatin  (LIPITOR) 20 MG tablet  losartan -hydrochlorothiazide  (HYZAAR) 100-12.5 MG tablet  Has the patient contacted their pharmacy? Yes (Agent: If no, request that the patient contact the pharmacy for the refill. If patient does not wish to contact the pharmacy document the reason why and proceed with request.) (Agent: If yes, when and what did the pharmacy advise?)  This is the patient's preferred pharmacy:   [facility] [employer] [facility], [place] - [id number] [place] [address] [place] [id number] Phone: [phone] Fax: [phone]  Is this the correct pharmacy for this prescription? Yes If no, delete pharmacy and type the correct one.   Has the prescription been filled recently? Yes  Is the patient out of the medication? Yes  Has the patient been seen for an appointment in the last year OR does the patient have an upcoming appointment? Yes  Can we respond through MyChart? Yes  Agent: Please be advised that Rx refills may take up to 3 business days. We ask that you follow-up with your pharmacy.

## 2023-08-20 NOTE — Telephone Encounter (Signed)
 Copied from CRM [id number]. Topic: Clinical - Medication Refill >> Aug 20, 2023 11:54 AM Mercedes MATSU wrote: Medication:  losartan -hydrochlorothiazide  (HYZAAR) 100-12.5 MG tablet   Has the patient contacted their pharmacy? Yes (Agent: If no, request that the patient contact the pharmacy for the refill. If patient does not wish to contact the pharmacy document the reason why and proceed with request.) (Agent: If yes, when and what did the pharmacy advise?)  This is the patient's preferred pharmacy:   [facility] [employer], [place] - [id number] [place] [address] [place] [address] Phone: [phone] Fax: [phone]   Is this the correct pharmacy for this prescription? Yes If no, delete pharmacy and type the correct one.   Has the prescription been filled recently? Yes  Is the patient out of the medication? Yes  Has the patient been seen for an appointment in the last year OR does the patient have an upcoming appointment? Yes  Can we respond through MyChart? Yes  Agent: Please be advised that Rx refills may take up to 3 business days. We ask that you follow-up with your pharmacy.

## 2023-08-21 MED ORDER — ATORVASTATIN CALCIUM 20 MG PO TABS: 20.0000 mg | ORAL_TABLET | Freq: Every evening | ORAL | 3 refills | Status: AC

## 2023-08-26 ENCOUNTER — Other Ambulatory Visit: Payer: Self-pay | Admitting: Internal Medicine

## 2023-08-26 MED ORDER — TIRZEPATIDE 2.5 MG/0.5ML ~~LOC~~ SOAJ: 2.5000 mg | SUBCUTANEOUS | 0 refills | Status: DC

## 2023-08-26 NOTE — Telephone Encounter (Signed)
 Copied from CRM [id number]. Topic: Clinical - Medication Refill >> Aug 26, 2023 11:04 AM Laymon HERO wrote: Medication: tirzepatide  (MOUNJARO ) 2.5 MG/0.5ML Pen  Has the patient contacted their pharmacy? Yes (Agent: If no, request that the patient contact the pharmacy for the refill. If patient does not wish to contact the pharmacy document the reason why and proceed with request.) (Agent: If yes, when and what did the pharmacy advise?)  This is the patient's preferred pharmacy:  [facility] - [place], [place] - [address] [place] [address] Phone: [phone] Fax: [phone]   Is this the correct pharmacy for this prescription? Yes If no, delete pharmacy and type the correct one.   Has the prescription been filled recently? Yes  Is the patient out of the medication? Yes  Has the patient been seen for an appointment in the last year OR does the patient have an upcoming appointment? Yes  Can we respond through MyChart? Yes  Agent: Please be advised that Rx refills may take up to 3 business days. We ask that you follow-up with your pharmacy.

## 2023-09-09 ENCOUNTER — Other Ambulatory Visit: Payer: Self-pay

## 2023-09-09 ENCOUNTER — Emergency Department: Admission: EM | Admit: 2023-09-09 | Discharge: 2023-09-09 | Disposition: A | Discharge status: Home / Self Care

## 2023-09-09 ENCOUNTER — Emergency Department

## 2023-09-09 ENCOUNTER — Ambulatory Visit: Admitting: Internal Medicine

## 2023-09-09 DIAGNOSIS — M519 Unspecified thoracic, thoracolumbar and lumbosacral intervertebral disc disorder: Secondary | ICD-10-CM | POA: Insufficient documentation

## 2023-09-09 DIAGNOSIS — R609 Edema, unspecified: Secondary | ICD-10-CM | POA: Diagnosis not present

## 2023-09-09 DIAGNOSIS — G9520 Unspecified cord compression: Secondary | ICD-10-CM | POA: Diagnosis not present

## 2023-09-09 DIAGNOSIS — M5030 Other cervical disc degeneration, unspecified cervical region: Secondary | ICD-10-CM | POA: Diagnosis not present

## 2023-09-09 DIAGNOSIS — M9979 Connective tissue and disc stenosis of intervertebral foramina of abdomen and other regions: Secondary | ICD-10-CM | POA: Insufficient documentation

## 2023-09-09 DIAGNOSIS — M4802 Spinal stenosis, cervical region: Secondary | ICD-10-CM | POA: Insufficient documentation

## 2023-09-09 DIAGNOSIS — M542 Cervicalgia: Secondary | ICD-10-CM

## 2023-09-09 DIAGNOSIS — I10 Essential (primary) hypertension: Secondary | ICD-10-CM | POA: Diagnosis not present

## 2023-09-09 DIAGNOSIS — M50821 Other cervical disc disorders at C4-C5 level: Secondary | ICD-10-CM | POA: Diagnosis not present

## 2023-09-09 DIAGNOSIS — M47812 Spondylosis without myelopathy or radiculopathy, cervical region: Secondary | ICD-10-CM | POA: Diagnosis not present

## 2023-09-09 DIAGNOSIS — M48061 Spinal stenosis, lumbar region without neurogenic claudication: Secondary | ICD-10-CM | POA: Diagnosis not present

## 2023-09-09 DIAGNOSIS — M4722 Other spondylosis with radiculopathy, cervical region: Secondary | ICD-10-CM | POA: Diagnosis not present

## 2023-09-09 MED ORDER — METHYLPREDNISOLONE 4 MG PO TBPK: ORAL_TABLET | ORAL | 0 refills | Status: DC

## 2023-09-09 MED ORDER — ONDANSETRON 4 MG PO TBDP
4.0000 mg | ORAL_TABLET | Freq: Once | ORAL | Status: AC
  Administered 2023-09-09: 4 mg via ORAL
  Filled 2023-09-09: qty 1

## 2023-09-09 MED ORDER — HYDROCODONE-ACETAMINOPHEN 5-325 MG PO TABS
1.0000 | ORAL_TABLET | Freq: Once | ORAL | Status: AC
  Administered 2023-09-09: 1 via ORAL
  Filled 2023-09-09: qty 1

## 2023-09-09 MED ORDER — HYDROCODONE-ACETAMINOPHEN 5-325 MG PO TABS
1.0000 | ORAL_TABLET | Freq: Three times a day (TID) | ORAL | 0 refills | Status: AC | PRN
Start: 2023-09-09 — End: 2023-09-12

## 2023-09-09 NOTE — ED Triage Notes (Signed)
 Pt comes with neck pain for about week. Pt denies any known injuries or falls. Pt also has lump on left upper shoulder area.

## 2023-09-09 NOTE — ED Notes (Signed)
 Pt back form MRI at this time

## 2023-09-09 NOTE — Discharge Instructions (Addendum)
 Your exam, CT scan, and MRI of the neck, shows severe degenerative disc disease, narrowing, and irritation of the spinal cord.  You will be started on a steroid taper pack, and given pain medicine to take as needed.  Take OTC Tylenol  for nondrowsy pain relief.  Follow-up with Dr. Katrina for reevaluation.  Return to the ED for acutely worsening symptoms.

## 2023-09-09 NOTE — ED Provider Notes (Signed)
 [facility] Emergency Department Provider Note     Event Date/Time   First MD Initiated Contact with Patient 09/09/23 1653     (approximate)   History   Neck Pain   HPI  Susan Holland is a 78 y.o. female HLD, HTN, osteoporosis, diverticulosis, presents to the ED endorsing 1 week of neck pain.  Patient denies any preceding injury, trauma, or falls.  She denies any weakness, grip changes, or distal paresthesias.  She would endorse pain from the posterior neck to the top of the head that is aggravated by movement.  She does report what she describes as a lump on the left upper shoulder area.  Patient denies any bladder or bowel incontinence, foot drop, saddle anesthesia.  She denies any gross weakness to the upper extremities, no grip changes, and no paralysis noted.  Denies any history of chronic or persistent neck pain or radicular symptoms.  Physical Exam   Triage Vital Signs: ED Triage Vitals  Encounter Vitals Group     BP 09/09/23 1535 (!) 156/86     Girls Systolic BP Percentile --      Girls Diastolic BP Percentile --      Boys Systolic BP Percentile --      Boys Diastolic BP Percentile --      Pulse Rate 09/09/23 1535 (!) 102     Resp 09/09/23 1535 18     Temp 09/09/23 1535 98 F (36.7 C)     Temp src --      SpO2 09/09/23 1535 91 %     Weight 09/09/23 1534 180 lb (81.6 kg)     Height 09/09/23 1534 5' 4 (1.626 m)     Head Circumference --      Peak Flow --      Pain Score 09/09/23 1534 8     Pain Loc --      Pain Education --      Exclude from Growth Chart --     Most recent vital signs: Vitals:   09/09/23 2115 09/09/23 2130  BP:    Pulse: 69 86  Resp:    Temp:    SpO2: 96% 94%    General Awake, no distress. NAD  HEENT NCAT. PERRL. EOMI. No rhinorrhea. Mucous membranes are moist.  CV:  Good peripheral perfusion. RRR RESP:  Normal effort. CTA MSK:  Normal spinal alignment without midline tenderness, spasm, deformity, or  step-off of the cervical, thoracic, or lumbar spine, respectively.  Patient with decreased range of motion with cervical rotation and lateral bending bilaterally.  AROM of all extremities.  Normal composite fist distally. NEURO: Cranial nerves II to XII grossly intact.  Normal LE DTRs bilaterally.  Normal intrinsic opposition testing of the upper extremities bilaterally.   ED Results / Procedures / Treatments   Labs (all labs ordered are listed, but only abnormal results are displayed) Labs Reviewed - No data to display   EKG    RADIOLOGY  I personally viewed and evaluated these images as part of my medical decision making, as well as reviewing the written report by the radiologist.  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 COMPARISON: Same day CT cervical spine. CLINICAL HISTORY: Cervical radiculopathy, no  red flags. Pt comes with neck pain for about week. Pt denies any known injuries or falls. Pt also has lump on left upper shoulder area. FINDINGS: BONES AND ALIGNMENT: Straightening and slight reversal of the normal cervical lordosis. No significant listhesis. No bone marrow edema or evidence of fracture. Vertebral body heights are maintained. Mildly heterogeneous bone marrow signal intensity without suspicious osseous lesion. Schmorl's node involving the C7 superior endplate. SPINAL CORD: There is cord compression at C3-4 and C4-5 related to degenerative changes with areas of subtle signal abnormality suggestive of myelomalacia. The cord is otherwise normal in caliber. SOFT TISSUES: There is mild edema in the paraspinal musculature particularly on the right within the lower  cervical spine. There is additional focal edema in the paraspinal soft tissues at C3-4 involving the paraspinal musculature with possible involvement of the interspinous ligament. C2-C3: At C2-3 there is a small disc osteophyte complex which indents the ventral thecal sac without contacting the spinal cord. Thickening of the ligaments and flavum. Mild spinal canal stenosis. Bilateral facet arthrosis without significant foraminal stenosis. C3-C4: At C3-4 there is mild disc height loss. Disc osteophyte complex contacts the ventral cervical cord. Thickening of the ligaments and flavum and indents dorsal thecal sac. Severe spinal canal stenosis and cord compression. Bilateral facet arthrosis. Uncovertebral hypertrophy on the left. Severe right and mild left foraminal stenosis. C4-C5: At C4-5 there is moderate disc height loss. Disc osteophyte complex indents ventral thecal sac with flattening of the ventral cervical cord. Thickening of the ligaments and flavum which indents the dorsal thecal sac. Moderate to severe spinal canal stenosis and cord compression. Bilateral facet arthrosis and uncovertebral hypertrophy. There is moderate to severe right and moderate left foraminal stenosis. C5-C6: At C5-6 is a disc osteophyte complex eccentric to the left which indents the ventral thecal sac with subtle flattening of the ventral cord. Thickening of the ligaments and flavum. Bilateral facet arthrosis. No significant foraminal stenosis. C6-C7: At C6-7 there is mild disc height loss. Disc osteophyte complex slightly eccentric to the left and indents ventral thecal sac with subtle flattening of the ventral cord. Thickening of the ligaments and flavum. Bilateral facet arthrosis and uncovertebral hypertrophy. There is mild right and moderate left foraminal stenosis. C7-T1: At C7-T1 there is a disc osteophyte complex which indents the ventral thecal sac without contacting the spinal cord. Thickening of the ligaments and flavum.  Bilateral facet arthrosis with mild bilateral foraminal stenosis. IMPRESSION: 1. Cord compression at C3-4 and C4-5 related to degenerative changes with areas of subtle signal abnormality suggestive of myelomalacia. Severe spinal canal stenosis at C3-4. Additional moderate-to-severe spinal canal stenosis at C4-5. 2. Multilevel foraminal stenosis, greatest and severe on the right at C3-4. There is additional moderate-to-severe right foraminal stenosis at C4-5. 3. Mild edema in the paraspinal musculature, particularly on the right within the lower cervical spine, and additional focal edema in the paraspinal soft tissues at C3-4 with possible involvement of the interspinous ligament. Electronically signed by: Donnice Mania MD 09/09/2023 09:01 PM EDT RP Workstation: [id number]   CT Cervical Spine Wo Contrast Result Date: 09/09/2023 CLINICAL DATA:  Neck pain 1 week.  No injury. EXAM: CT CERVICAL SPINE WITHOUT CONTRAST TECHNIQUE: Multidetector CT imaging of the cervical spine was performed without intravenous contrast. Multiplanar CT image reconstructions were also generated. RADIATION DOSE REDUCTION: This exam was performed according to the departmental dose-optimization program which includes automated exposure control, adjustment of the mA and/or kV according to patient size and/or use of iterative reconstruction technique. COMPARISON:  None Available. FINDINGS: Alignment: Normal. Skull base and vertebrae: Moderate spondylosis of the cervical spine to include uncovertebral joint spurring and facet arthropathy. Mild loss of mid vertebral body height of C7 likely due to Schmorl's node and less likely early endplate compression deformity. Atlantoaxial articulation is unremarkable. No acute fracture or subluxation. Mild right-sided neural from narrowing at the C2-3 level. Bilateral neural foraminal narrowing at the C3-4 level right worse than left as well as at the C4-5 level right worse than left. Soft tissues and spinal  canal: Prevertebral soft tissues are normal. Mild narrowing of the right side of the spinal canal at the C3 level due to extradural calcification over the posterior aspect of the canal. Disc levels:  Disc space narrowing at the C4-5 and C6-7 levels. Upper chest: No acute findings. Other: None. IMPRESSION: 1. No acute findings. 2. Moderate spondylosis of the cervical spine with disc disease at the C4-5 and C6-7 levels. Multilevel neural foraminal narrowing as described. 3. Mild loss of mid vertebral body height of C7 likely due to Schmorl's node and less likely early endplate compression deformity. Consider MRI if clinically indicated. Electronically Signed   By: Toribio Agreste M.D.   On: 09/09/2023 16:49    PROCEDURES:  Critical Care performed: No  Procedures   MEDICATIONS ORDERED IN ED: Medications  HYDROcodone -acetaminophen  (NORCO/VICODIN) 5-325 MG per tablet 1 tablet (1 tablet Oral Given 09/09/23 1752)  ondansetron  (ZOFRAN -ODT) disintegrating tablet 4 mg (4 mg Oral Given 09/09/23 1752)     IMPRESSION / MDM / ASSESSMENT AND PLAN / ED COURSE  I reviewed the triage vital signs and the nursing notes.                              Differential diagnosis includes, but is not limited to, cervical radiculopathy, cervical compression fracture, spinal stenosis, foraminal stenosis, spondylolisthesis, spinal cord compression  Patient's presentation is most consistent with acute complicated illness / injury requiring diagnostic workup.  Patient's diagnosis is consistent with acute multifactorial neck pain.  Patient presents with acute midline neck pain and associated disability.  Exam is overall reassuring.  No red flags on exam.  No evidence of acute spinal cord compression.  Patient endorsing neck stiffness with referred pain to the top of the crown of the head.  She is intact neurologically distally.  Initial CT scan of the cervical spine, read is concerning for possible compression fracture at C7 with  multilevel DDD and foraminal stenosis.  Further evaluation with MRI of cervical spine, interpreted by me, shows multilevel DDD, spinal stenosis, foraminal stenosis, and cord compression at C4-5 and C6-7, respectively.  Geriatric patient with acute neck pain and disability secondary to underlying arthropathy.  I discussed the case with Dr. Reeves Nine; he suggested a steroid taper pack given the patient is without evidence of distal spinal cord compression syndrome.  He will follow the patient in the office for further evaluation.  Patient will be discharged home with prescriptions for Medrol  Dosepak and hydrocodone  (#9).  Patient is endorsing near resolution of her pain at time of this evaluation and disposition.  She is cautiously able to range the cervical spine without significant discomfort.  Patient is to follow up with neurology/neurosurgery as discussed, as needed or otherwise directed. Patient is given ED precautions to return to the ED for any worsening or new symptoms.   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    Loyd Candida LULLA Aldona, PA-C 09/09/23 2209    Clarine Ozell LABOR, MD 09/10/23 [phone]

## 2023-09-18 NOTE — Progress Notes (Unsigned)
 Referring Physician:  Geofm Glade PARAS, MD [address] [place],  [place] [address]  Primary Physician:  Susan Glade PARAS, MD  History of Present Illness: 09/24/2023 [name] is here today with a chief complaint of neck pain.  She has experienced persistent neck pain since an emergency department visit two weeks ago, though the pain is less severe now. There is no radiation of pain into her arms, numbness, tingling, or changes in fine motor control. She occasionally experiences shooting pain into her head. Pain management includes pain medication, with six pills taken over two weeks, and a heating pad. She completed a course of prednisone and cannot take NSAIDs. She denies any walking difficulties, trips, or falls, and manages her daily activities independently while living alone.  Bowel/Bladder Dysfunction: none  Conservative measures:  Physical therapy: has not participated in Multimodal medical therapy including regular antiinflammatories:  Medrol  Dosepak, Hydrocodone  Injections:  no epidural steroid injections  Past Surgery: no spine surgery  Susan Holland has minimal symptoms of cervical myelopathy.  The symptoms are causing a significant impact on the patient's life.   I have utilized the care everywhere function in epic to review the outside records available from external health systems.      Review of Systems:  A 10 point review of systems is negative, except for the pertinent positives and negatives detailed in the HPI.  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 , Disp: , Rfl:    losartan -hydrochlorothiazide  (HYZAAR) 100-12.5 MG tablet, Take 1 tablet by mouth daily., Disp: 90 tablet, Rfl: 3   Multiple Vitamin (MULTIVITAMIN WITH MINERALS) TABS tablet, Take 1 tablet by mouth daily., Disp: , Rfl:    Omega-3 Fatty Acids (FISH OIL) 1200 MG CAPS, Take 1,200 mg by mouth 2 (two) times daily., Disp: , Rfl:    tirzepatide  (MOUNJARO ) 2.5 MG/0.5ML Pen, Inject 2.5 mg into the skin once a week., Disp: 2 mL, Rfl: 0  Social History: Social History   Tobacco Use   Smoking status: Former    Current packs/day: 0.00    Types: Cigarettes    Quit date: 03/29/2015    Years since quitting: 8.4    Passive exposure: Never   Smokeless tobacco: Never   Tobacco comments:  smoked 1963- present, up to 1/2 ppd  Vaping Use   Vaping status: Never Used  Substance Use Topics   Alcohol use: Yes    Alcohol/week: 0.0 standard drinks of alcohol    Comment:  occasionally    Drug use: No    Family Medical History: Family History  Problem Relation Age of Onset   Dementia Mother        Alsheimer's   Stroke Father 57   Diabetes Sister    Heart disease Sister        AF   Colon cancer Maternal Uncle        X 2   Esophageal cancer Neg Hx    Rectal cancer Neg Hx    Stomach cancer Neg Hx     Physical Examination: Vitals:   09/24/23  0903  BP: 124/78    General: Patient is in no apparent distress. Attention to examination is appropriate.  Neck:   Supple.  Full range of motion.  Respiratory: Patient is breathing without any difficulty.   NEUROLOGICAL:     Awake, alert, oriented to person, place, and time.  Speech is clear and fluent.   Cranial Nerves: Pupils equal round and reactive to light.  Facial tone is symmetric.  Facial sensation is symmetric. Shoulder shrug is symmetric. Tongue protrusion is midline.  There is no pronator drift.  Strength: Side Biceps Triceps Deltoid Interossei Grip Wrist Ext. Wrist Flex.  R 5 5 5 5 5 5 5   L 5 5 5 5 5 5 5    Side Iliopsoas Quads Hamstring PF DF EHL  R 5 5 5 5 5 5   L 5 5 5 5 5 5    Reflexes are 2+ and symmetric at the biceps, triceps, brachioradialis, patella and achilles.   Hoffman's is absent.   Bilateral upper and lower extremity sensation is intact to light touch.    No evidence of dysmetria noted.  Gait is normal.  Mild to moderate difficulty with tandem walk.   Medical Decision Making  Imaging: MRI C spine 09/09/2023 SOFT TISSUES: There is mild edema in the paraspinal musculature particularly on the right within the lower cervical spine. There is additional focal edema in the paraspinal soft tissues at C3-4 involving the paraspinal musculature with possible involvement of the interspinous ligament.   C2-C3: At C2-3 there is a small disc osteophyte complex which indents the ventral thecal sac without contacting the spinal cord. Thickening of the ligaments and flavum. Mild spinal canal stenosis. Bilateral facet arthrosis without significant foraminal stenosis.   C3-C4: At C3-4 there is mild disc height loss. Disc osteophyte complex contacts the ventral cervical cord. Thickening of the ligaments and flavum and indents dorsal thecal sac. Severe spinal canal stenosis and cord compression. Bilateral facet arthrosis. Uncovertebral hypertrophy on the left.  Severe right and mild left foraminal stenosis.   C4-C5: At C4-5 there is moderate disc height loss. Disc osteophyte complex indents ventral thecal sac with flattening of the ventral cervical cord. Thickening of the ligaments and flavum which indents the dorsal thecal sac. Moderate to severe spinal canal stenosis and cord compression. Bilateral facet arthrosis and uncovertebral hypertrophy. There is moderate to severe right and moderate left foraminal stenosis.   C5-C6: At C5-6 is a disc osteophyte complex eccentric to the left which indents the ventral thecal sac with subtle flattening of the ventral cord. Thickening of the ligaments and flavum. Bilateral facet arthrosis. No significant foraminal stenosis.   C6-C7: At C6-7 there is mild disc  height loss. Disc osteophyte complex slightly eccentric to the left and indents ventral thecal sac with subtle flattening of the ventral cord. Thickening of the ligaments and flavum. Bilateral facet arthrosis and uncovertebral hypertrophy. There is mild right and moderate left foraminal stenosis.   C7-T1: At C7-T1 there is a disc osteophyte complex which indents the ventral thecal sac without contacting the spinal cord. Thickening of the ligaments and flavum. Bilateral facet arthrosis with mild bilateral foraminal stenosis.   IMPRESSION: 1. Cord compression at C3-4 and C4-5 related to degenerative changes with areas of subtle signal abnormality suggestive of myelomalacia. Severe spinal canal stenosis at C3-4. Additional moderate-to-severe spinal canal stenosis at C4-5. 2. Multilevel foraminal stenosis, greatest and severe on the right at C3-4. There is additional moderate-to-severe right foraminal stenosis at C4-5. 3. Mild edema in the paraspinal musculature, particularly on the right within the lower cervical spine, and additional focal edema in the paraspinal soft tissues at C3-4 with possible involvement of the interspinous ligament.    Electronically signed by: Donnice Mania MD 09/09/2023 09:01 PM EDT RP Workstation: [id number]  CT C spine 09/09/2023  IMPRESSION: 1. No acute findings. 2. Moderate spondylosis of the cervical spine with disc disease at the C4-5 and C6-7 levels. Multilevel neural foraminal narrowing as described. 3. Mild loss of mid vertebral body height of C7 likely due to Schmorl's node and less likely early endplate compression deformity. Consider MRI if clinically indicated.     Electronically Signed   By: Toribio Agreste M.D.   On: 09/09/2023 16:49  I have personally reviewed the images and agree with the above interpretation.  Assessment and Plan: Ms. Bossman is a pleasant 78 y.o. female with severe cervical stenosis causing mild myelopathy.  She has a hint of myelomalacia.  Her neck pain has improved.  I will send her to physical therapy to help with her neck pain and mobility.  We will continue to monitor her myelopathy for now.  I will see her back in 2 to 3 months.  If she develops any worsening symptoms that could be attributable to myelopathy, we will discuss the options.  I spent a total of 30 minutes in this patient's care today. This time was spent reviewing pertinent records including imaging studies, obtaining and confirming history, performing a directed evaluation, formulating and discussing my recommendations, and documenting the visit within the medical record.      Thank you for involving me in the care of this patient.      Westly Hinnant K. Clois MD, [facility] Neurosurgery

## 2023-09-24 ENCOUNTER — Encounter: Payer: Self-pay | Admitting: Neurosurgery

## 2023-09-24 ENCOUNTER — Ambulatory Visit (INDEPENDENT_AMBULATORY_CARE_PROVIDER_SITE_OTHER): Admitting: Neurosurgery

## 2023-09-24 VITALS — BP 124/78 | Ht 65.0 in | Wt 174.5 lb

## 2023-09-24 DIAGNOSIS — M4802 Spinal stenosis, cervical region: Secondary | ICD-10-CM | POA: Diagnosis not present

## 2023-09-24 DIAGNOSIS — M542 Cervicalgia: Secondary | ICD-10-CM

## 2023-09-24 DIAGNOSIS — G959 Disease of spinal cord, unspecified: Secondary | ICD-10-CM | POA: Diagnosis not present

## 2023-09-24 DIAGNOSIS — G9589 Other specified diseases of spinal cord: Secondary | ICD-10-CM

## 2023-09-28 ENCOUNTER — Encounter: Payer: Self-pay | Admitting: Internal Medicine

## 2023-09-29 ENCOUNTER — Encounter: Payer: Self-pay | Admitting: Neurosurgery

## 2023-09-29 MED ORDER — TIRZEPATIDE 5 MG/0.5ML ~~LOC~~ SOAJ: 5.0000 mg | SUBCUTANEOUS | 0 refills | Status: DC

## 2023-10-03 ENCOUNTER — Ambulatory Visit: Admitting: Neurosurgery

## 2023-10-17 DIAGNOSIS — M542 Cervicalgia: Secondary | ICD-10-CM | POA: Diagnosis not present

## 2023-11-13 ENCOUNTER — Encounter: Payer: Self-pay | Admitting: Pharmacist

## 2023-11-13 NOTE — Progress Notes (Signed)
 Pharmacy Quality Measure Review  This patient is appearing on a report for being at risk of failing the adherence measure for cholesterol (statin) and hypertension (ACEi/ARB) medications this calendar year.   Medication: Atorvastatin  20 mg Last fill date: 08/23/23 for 90 day supply  Medication: Losartan /HCTZ Last fill date: 08/23/23 for 90 day supply  Insurance report was not up to date. No action needed at this time.   Has refills for upcoming refill due in October.  Darrelyn Drum, PharmD, BCPS, CPP Clinical Pharmacist Practitioner [facility] [phone]

## 2023-11-19 ENCOUNTER — Ambulatory Visit: Admitting: Neurosurgery

## 2023-11-19 VITALS — BP 124/76 | Ht 65.0 in | Wt 176.2 lb

## 2023-11-19 DIAGNOSIS — G959 Disease of spinal cord, unspecified: Secondary | ICD-10-CM | POA: Diagnosis not present

## 2023-11-19 DIAGNOSIS — M4802 Spinal stenosis, cervical region: Secondary | ICD-10-CM

## 2023-11-19 DIAGNOSIS — G9589 Other specified diseases of spinal cord: Secondary | ICD-10-CM

## 2023-11-19 NOTE — Progress Notes (Signed)
 Referring Physician:  Geofm Glade PARAS, MD [address] [place],  [place] [address]  Primary Physician:  Geofm Glade PARAS, MD  History of Present Illness: 11/19/2023 Susan Holland is doing much better.  She went to physical therapy and was sent home with home exercise plan.  She has no symptoms of myelopathy.  09/24/2023 Susan Holland is here today with a chief complaint of neck pain.  She has experienced persistent neck pain since an emergency department visit two weeks ago, though the pain is less severe now. There is no radiation of pain into her arms, numbness, tingling, or changes in fine motor control. She occasionally experiences shooting pain into her head. Pain management includes pain medication, with six pills taken over two weeks, and a heating pad. She completed a course of prednisone and cannot take NSAIDs. She denies any walking difficulties, trips, or falls, and manages her daily activities independently while living alone.  Bowel/Bladder Dysfunction: none  Conservative measures:  Physical therapy: has not participated in Multimodal medical therapy including regular antiinflammatories:  Medrol  Dosepak, Hydrocodone  Injections:  no epidural steroid injections  Past Surgery: no spine surgery  Susan Holland has minimal symptoms of cervical myelopathy.  The symptoms are causing a significant impact on the patient's life.   I have utilized the care everywhere function in epic to review the outside records available from external health systems.      Review of Systems:  A 10 point review of systems is negative, except for the pertinent positives and negatives detailed in the HPI.  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 , Disp: , Rfl:    losartan -hydrochlorothiazide  (HYZAAR) 100-12.5 MG tablet, Take 1 tablet by mouth daily., Disp: 90 tablet, Rfl: 3   Multiple Vitamin (MULTIVITAMIN WITH MINERALS) TABS tablet, Take 1 tablet by mouth daily., Disp: , Rfl:    Omega-3 Fatty Acids (FISH OIL) 1200 MG CAPS, Take 1,200 mg by mouth 2 (two) times daily., Disp: , Rfl:    tirzepatide  (MOUNJARO ) 5 MG/0.5ML Pen, Inject 5 mg into the skin once a week., Disp: 6 mL, Rfl: 0  Social History: Social History   Tobacco Use   Smoking status: Former    Current packs/day: 0.00    Types: Cigarettes  Quit date: 03/29/2015    Years since quitting: 8.6    Passive exposure: Never   Smokeless tobacco: Never   Tobacco comments:    smoked 1963- present, up to 1/2 ppd  Vaping Use   Vaping status: Never Used  Substance Use Topics   Alcohol use: Yes    Alcohol/week: 0.0 standard drinks of alcohol    Comment:  occasionally    Drug use: No    Family Medical History: Family History  Problem Relation Age of Onset   Dementia Mother        Alsheimer's   Stroke Father 70   Diabetes Sister    Heart disease Sister        AF   Colon  cancer Maternal Uncle        X 2   Esophageal cancer Neg Hx    Rectal cancer Neg Hx    Stomach cancer Neg Hx     Physical Examination: Vitals:   11/19/23 0926  BP: 124/76    General: Patient is in no apparent distress. Attention to examination is appropriate.  Neck:   Supple.  Full range of motion.  Respiratory: Patient is breathing without any difficulty.   NEUROLOGICAL:     Awake, alert, oriented to person, place, and time.  Speech is clear and fluent.   Cranial Nerves: Pupils equal round and reactive to light.  Facial tone is symmetric.  Facial sensation is symmetric. Shoulder shrug is symmetric. Tongue protrusion is midline.  There is no pronator drift.  Strength: Side Biceps Triceps Deltoid Interossei Grip Wrist Ext. Wrist Flex.  R 5 5 5 5 5 5 5   L 5 5 5 5 5 5 5    Side Iliopsoas Quads Hamstring PF DF EHL  R 5 5 5 5 5 5   L 5 5 5 5 5 5    Reflexes are 2+ and symmetric at the biceps, triceps, brachioradialis, patella and achilles.   Hoffman's is absent.   Bilateral upper and lower extremity sensation is intact to light touch.    No evidence of dysmetria noted.  Gait is normal.     Medical Decision Making  Imaging: MRI C spine 09/09/2023 SOFT TISSUES: There is mild edema in the paraspinal musculature particularly on the right within the lower cervical spine. There is additional focal edema in the paraspinal soft tissues at C3-4 involving the paraspinal musculature with possible involvement of the interspinous ligament.   C2-C3: At C2-3 there is a small disc osteophyte complex which indents the ventral thecal sac without contacting the spinal cord. Thickening of the ligaments and flavum. Mild spinal canal stenosis. Bilateral facet arthrosis without significant foraminal stenosis.   C3-C4: At C3-4 there is mild disc height loss. Disc osteophyte complex contacts the ventral cervical cord. Thickening of the ligaments and flavum and indents dorsal thecal sac.  Severe spinal canal stenosis and cord compression. Bilateral facet arthrosis. Uncovertebral hypertrophy on the left. Severe right and mild left foraminal stenosis.   C4-C5: At C4-5 there is moderate disc height loss. Disc osteophyte complex indents ventral thecal sac with flattening of the ventral cervical cord. Thickening of the ligaments and flavum which indents the dorsal thecal sac. Moderate to severe spinal canal stenosis and cord compression. Bilateral facet arthrosis and uncovertebral hypertrophy. There is moderate to severe right and moderate left foraminal stenosis.   C5-C6: At C5-6 is a disc osteophyte complex eccentric to the left which indents the ventral thecal sac with subtle flattening of the ventral cord.  Thickening of the ligaments and flavum. Bilateral facet arthrosis. No significant foraminal stenosis.   C6-C7: At C6-7 there is mild disc height loss. Disc osteophyte complex slightly eccentric to the left and indents ventral thecal sac with subtle flattening of the ventral cord. Thickening of the ligaments and flavum. Bilateral facet arthrosis and uncovertebral hypertrophy. There is mild right and moderate left foraminal stenosis.   C7-T1: At C7-T1 there is a disc osteophyte complex which indents the ventral thecal sac without contacting the spinal cord. Thickening of the ligaments and flavum. Bilateral facet arthrosis with mild bilateral foraminal stenosis.   IMPRESSION: 1. Cord compression at C3-4 and C4-5 related to degenerative changes with areas of subtle signal abnormality suggestive of myelomalacia. Severe spinal canal stenosis at C3-4. Additional moderate-to-severe spinal canal stenosis at C4-5. 2. Multilevel foraminal stenosis, greatest and severe on the right at C3-4. There is additional moderate-to-severe right foraminal stenosis at C4-5. 3. Mild edema in the paraspinal musculature, particularly on the right within the lower cervical spine, and  additional focal edema in the paraspinal soft tissues at C3-4 with possible involvement of the interspinous ligament.   Electronically signed by: Donnice Mania MD 09/09/2023 09:01 PM EDT RP Workstation: [id number]  CT C spine 09/09/2023  IMPRESSION: 1. No acute findings. 2. Moderate spondylosis of the cervical spine with disc disease at the C4-5 and C6-7 levels. Multilevel neural foraminal narrowing as described. 3. Mild loss of mid vertebral body height of C7 likely due to Schmorl's node and less likely early endplate compression deformity. Consider MRI if clinically indicated.     Electronically Signed   By: Toribio Agreste M.D.   On: 09/09/2023 16:49  I have personally reviewed the images and agree with the above interpretation.  Assessment and Plan: Ms. Blanchet is a pleasant 78 y.o. 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  She has a hint of myelomalacia.  Her neck pain has improved.  We discussed that she is currently asymptomatic and significantly improved compared to how she was when I last saw her.  At this time, we will defer any intervention.  I offered her a follow-up appointment in 6 to 12 months.  She will let me know if she has any new symptoms of myelopathy.  We discussed that these include dexterity changes, weakness, numbness and tingling in her arms, or balance issues.  If any of these occur, I will be happy to see her to reevaluate.  I spent a total of 10 minutes in this patient's care today. This time was spent reviewing pertinent records including imaging studies, obtaining and confirming history, performing a directed evaluation, formulating and discussing my recommendations, and documenting the visit within the medical record.      Thank you for involving me in the care of this patient.      Dare Sanger K. Clois MD, [facility]

## 2023-11-27 ENCOUNTER — Encounter: Payer: Self-pay | Admitting: Internal Medicine

## 2023-11-27 DIAGNOSIS — H04123 Dry eye syndrome of bilateral lacrimal glands: Secondary | ICD-10-CM | POA: Diagnosis not present

## 2023-11-27 DIAGNOSIS — H35033 Hypertensive retinopathy, bilateral: Secondary | ICD-10-CM | POA: Diagnosis not present

## 2023-11-27 DIAGNOSIS — H40013 Open angle with borderline findings, low risk, bilateral: Secondary | ICD-10-CM | POA: Diagnosis not present

## 2023-11-27 DIAGNOSIS — H35371 Puckering of macula, right eye: Secondary | ICD-10-CM | POA: Diagnosis not present

## 2023-11-27 DIAGNOSIS — H524 Presbyopia: Secondary | ICD-10-CM | POA: Diagnosis not present

## 2023-11-27 LAB — OPHTHALMOLOGY REPORT-SCANNED

## 2023-11-27 NOTE — Progress Notes (Unsigned)
 Subjective:    Patient ID: ALEXEI EY, female    DOB: 1945-04-23, 78 y.o.   MRN: [id number]      HPI Alonnie is here for a Physical exam and her chronic medical problems.   Can not tell a difference between mounjaro  or ozempic .  She does have to pay a co-pay for Mounjaro .  Has no concerns-feels she is doing okay overall.   Medications and allergies reviewed with patient and updated if appropriate.  Current Outpatient Medications on File Prior to Visit  Medication Sig Dispense Refill   atorvastatin  (LIPITOR) 20 MG tablet Take 1 tablet (20 mg total) by mouth every evening. 90 tablet 3   Calcium  Carbonate-Vitamin D  (CALCIUM  + D PO) Take 1 tablet by mouth 2 (two) times daily.      losartan -hydrochlorothiazide  (HYZAAR) 100-12.5 MG tablet Take 1 tablet by mouth daily. 90 tablet 3   Multiple Vitamin (MULTIVITAMIN WITH MINERALS) TABS tablet Take 1 tablet by mouth daily.     Omega-3 Fatty Acids (FISH OIL) 1200 MG CAPS Take 1,200 mg by mouth 2 (two) times daily.     tirzepatide  (MOUNJARO ) 5 MG/0.5ML Pen Inject 5 mg into the skin once a week. 6 mL 0   No current facility-administered medications on file prior to visit.    Review of Systems  Constitutional:  Negative for fever.  Eyes:  Negative for visual disturbance.  Respiratory:  Negative for cough, shortness of breath and wheezing.   Cardiovascular:  Negative for chest pain, palpitations and leg swelling.  Gastrointestinal:  Positive for constipation. Negative for abdominal pain, blood in stool and diarrhea.       No gerd  Genitourinary:  Negative for dysuria.  Musculoskeletal:  Negative for arthralgias and back pain.  Skin:  Negative for rash.       Dry skin  Neurological:  Negative for light-headedness and headaches.  Psychiatric/Behavioral:  Negative for dysphoric mood. The patient is not nervous/anxious.        Objective:   Vitals:   11/28/23 1047  BP: 124/72  Pulse: 65  Temp: 98.3 F (36.8 C)  SpO2: 95%    Filed Weights   11/28/23 1047  Weight: 172 lb (78 kg)   Body mass index is 28.62 kg/m.  BP Readings from Last 3 Encounters:  11/28/23 124/72  11/19/23 124/76  09/24/23 124/78    Wt Readings from Last 3 Encounters:  11/28/23 172 lb (78 kg)  11/19/23 176 lb 4 oz (79.9 kg)  09/24/23 174 lb 8 oz (79.2 kg)       Physical Exam Constitutional: She appears well-developed and well-nourished. No distress.  HENT:  Head: Normocephalic and atraumatic.  Right Ear: External ear normal. Normal ear canal and TM Left Ear: External ear normal.  Normal ear canal and TM Mouth/Throat: Oropharynx is clear and moist.  Eyes: Conjunctivae normal.  Neck: Neck supple. No tracheal deviation present. No thyromegaly present.  No carotid bruit  Cardiovascular: Normal rate, regular rhythm and normal heart sounds.   No murmur heard.  No edema. Pulmonary/Chest: Effort normal and breath sounds normal. No respiratory distress. She has no wheezes. She has no rales.  Breast: deferred   Abdominal: Soft. She exhibits no distension. There is no tenderness.  Lymphadenopathy: She has no cervical adenopathy.  Skin: Skin is warm and dry. She is not diaphoretic.  Psychiatric: She has a normal mood and affect. Her behavior is normal.     Lab Results  Component Value Date  WBC 9.6 11/26/2022   HGB 13.8 11/26/2022   HCT 42.1 11/26/2022   PLT 248.0 11/26/2022   GLUCOSE 110 (H) 05/27/2023   CHOL 137 05/27/2023   TRIG 87.0 05/27/2023   HDL 48.10 05/27/2023   LDLDIRECT 159.4 03/19/2011   LDLCALC 71 05/27/2023   ALT 19 05/27/2023   AST 24 05/27/2023   NA 135 05/27/2023   K 4.0 05/27/2023   CL 98 05/27/2023   CREATININE 0.82 05/27/2023   BUN 11 05/27/2023   CO2 30 05/27/2023   TSH 2.39 11/26/2022   INR 1.0 10/08/2018   HGBA1C 6.2 05/27/2023   MICROALBUR <0.7 05/27/2023         Assessment & Plan:   Physical exam: Screening blood work  ordered Exercise none - Stressed regular exercise  [facility] good Substance abuse  none   Reviewed recommended immunizations.   Health Maintenance  Topic Date Due   DTaP/Tdap/Td (2 - Tdap) 03/17/2020   OPHTHALMOLOGY EXAM  11/11/2022   HEMOGLOBIN A1C  11/26/2023   Medicare Annual Wellness (AWV)  04/04/2024   Diabetic kidney evaluation - eGFR measurement  05/26/2024   Diabetic kidney evaluation - Urine ACR  05/26/2024   FOOT EXAM  05/26/2024   Colonoscopy  05/31/2024   DEXA SCAN  10/09/2024   Pneumococcal Vaccine: 50+ Years  Completed   Influenza Vaccine  Completed   Hepatitis C Screening  Completed   Zoster Vaccines- Shingrix  Completed   Meningococcal B Vaccine  Aged Out   Mammogram  Discontinued   COVID-19 Vaccine  Discontinued          See Problem List for Assessment and Plan of chronic medical problems.

## 2023-11-27 NOTE — Patient Instructions (Addendum)

## 2023-11-28 ENCOUNTER — Ambulatory Visit (INDEPENDENT_AMBULATORY_CARE_PROVIDER_SITE_OTHER): Admitting: Internal Medicine

## 2023-11-28 ENCOUNTER — Ambulatory Visit: Payer: Self-pay | Admitting: Internal Medicine

## 2023-11-28 VITALS — BP 124/72 | HR 65 | Temp 98.3°F | Ht 65.0 in | Wt 172.0 lb

## 2023-11-28 DIAGNOSIS — Z0000 Encounter for general adult medical examination without abnormal findings: Secondary | ICD-10-CM | POA: Diagnosis not present

## 2023-11-28 DIAGNOSIS — Z7985 Long-term (current) use of injectable non-insulin antidiabetic drugs: Secondary | ICD-10-CM

## 2023-11-28 DIAGNOSIS — E785 Hyperlipidemia, unspecified: Secondary | ICD-10-CM | POA: Diagnosis not present

## 2023-11-28 DIAGNOSIS — M810 Age-related osteoporosis without current pathological fracture: Secondary | ICD-10-CM

## 2023-11-28 DIAGNOSIS — E119 Type 2 diabetes mellitus without complications: Secondary | ICD-10-CM | POA: Insufficient documentation

## 2023-11-28 DIAGNOSIS — E1159 Type 2 diabetes mellitus with other circulatory complications: Secondary | ICD-10-CM

## 2023-11-28 DIAGNOSIS — E1169 Type 2 diabetes mellitus with other specified complication: Secondary | ICD-10-CM | POA: Diagnosis not present

## 2023-11-28 DIAGNOSIS — I152 Hypertension secondary to endocrine disorders: Secondary | ICD-10-CM | POA: Diagnosis not present

## 2023-11-28 LAB — COMPREHENSIVE METABOLIC PANEL WITH GFR
ALT: 14 U/L (ref 0–35)
AST: 19 U/L (ref 0–37)
Albumin: 4.6 g/dL (ref 3.5–5.2)
Alkaline Phosphatase: 56 U/L (ref 39–117)
BUN: 8 mg/dL (ref 6–23)
CO2: 30 meq/L (ref 19–32)
Calcium: 10.1 mg/dL (ref 8.4–10.5)
Chloride: 96 meq/L (ref 96–112)
Creatinine, Ser: 0.81 mg/dL (ref 0.40–1.20)
GFR: 69.41 mL/min (ref >60.00)
Glucose, Bld: 89 mg/dL (ref 70–99)
Potassium: 3.7 meq/L (ref 3.5–5.1)
Sodium: 135 meq/L (ref 135–145)
Total Bilirubin: 0.5 mg/dL (ref 0.2–1.2)
Total Protein: 7.2 g/dL (ref 6.0–8.3)

## 2023-11-28 LAB — CBC
HCT: 41.7 % (ref 36.0–46.0)
Hemoglobin: 13.8 g/dL (ref 12.0–15.0)
MCHC: 33.2 g/dL (ref 30.0–36.0)
MCV: 91 fl (ref 78.0–100.0)
Platelets: 230 K/uL (ref 150.0–400.0)
RBC: 4.58 Mil/uL (ref 3.87–5.11)
RDW: 13.8 % (ref 11.5–15.5)
WBC: 7.4 K/uL (ref 4.0–10.5)

## 2023-11-28 LAB — HEMOGLOBIN A1C: Hgb A1c MFr Bld: 6.1 % (ref 4.6–6.5)

## 2023-11-28 LAB — LIPID PANEL
Cholesterol: 129 mg/dL (ref 0–200)
HDL: 48.3 mg/dL (ref >39.00)
LDL Cholesterol: 61 mg/dL (ref 0–99)
NonHDL: 80.74
Total CHOL/HDL Ratio: 3
Triglycerides: 101 mg/dL (ref 0.0–149.0)
VLDL: 20.2 mg/dL (ref 0.0–40.0)

## 2023-11-28 LAB — TSH: TSH: 2.08 u[IU]/mL (ref 0.35–5.50)

## 2023-11-28 LAB — VITAMIN D 25 HYDROXY (VIT D DEFICIENCY, FRACTURES): VITD: 56.56 ng/mL (ref 30.00–100.00)

## 2023-11-28 NOTE — Assessment & Plan Note (Signed)
 Chronic ?Regular exercise and healthy diet encouraged ?Check lipid panel, CMP, TSH ?Continue atorvastatin 20 mg daily ?

## 2023-11-28 NOTE — Assessment & Plan Note (Signed)
 Chronic Last DEXA 2024-up-to-date Was on Prolia , but was not able to afford the co-pay-there was some improvement in her bone density with Prolia  Deferred additional treatment and would like to wait until her next DEXA Encouraged regular exercise Continue calcium  and vitamin D 

## 2023-11-28 NOTE — Assessment & Plan Note (Signed)
 Chronic Blood pressure well controlled CMP, CBC Continue losartan -HCTZ 100-12.5 mg daily

## 2023-11-28 NOTE — Assessment & Plan Note (Addendum)
 Chronic Associated with hyperlipidemia  Lab Results  Component Value Date   HGBA1C 6.2 05/27/2023   Sugars controlled Check A1c Continue Mounjaro  5 mg weekly  Stressed regular exercise, diabetic diet

## 2023-12-25 DIAGNOSIS — C44519 Basal cell carcinoma of skin of other part of trunk: Secondary | ICD-10-CM | POA: Diagnosis not present

## 2023-12-25 DIAGNOSIS — Z85820 Personal history of malignant melanoma of skin: Secondary | ICD-10-CM | POA: Diagnosis not present

## 2023-12-25 DIAGNOSIS — Z08 Encounter for follow-up examination after completed treatment for malignant neoplasm: Secondary | ICD-10-CM | POA: Diagnosis not present

## 2023-12-25 DIAGNOSIS — L814 Other melanin hyperpigmentation: Secondary | ICD-10-CM | POA: Diagnosis not present

## 2023-12-25 DIAGNOSIS — L570 Actinic keratosis: Secondary | ICD-10-CM | POA: Diagnosis not present

## 2023-12-25 DIAGNOSIS — Z85828 Personal history of other malignant neoplasm of skin: Secondary | ICD-10-CM | POA: Diagnosis not present

## 2023-12-25 DIAGNOSIS — D225 Melanocytic nevi of trunk: Secondary | ICD-10-CM | POA: Diagnosis not present

## 2023-12-25 DIAGNOSIS — D492 Neoplasm of unspecified behavior of bone, soft tissue, and skin: Secondary | ICD-10-CM | POA: Diagnosis not present

## 2023-12-25 DIAGNOSIS — L821 Other seborrheic keratosis: Secondary | ICD-10-CM | POA: Diagnosis not present

## 2023-12-26 ENCOUNTER — Other Ambulatory Visit: Payer: Self-pay | Admitting: Internal Medicine

## 2024-01-16 ENCOUNTER — Observation Stay
Admission: EM | Admit: 2024-01-16 | Discharge: 2024-01-17 | Disposition: A | Discharge status: Home / Self Care | Attending: Internal Medicine | Admitting: Internal Medicine

## 2024-01-16 ENCOUNTER — Emergency Department

## 2024-01-16 ENCOUNTER — Other Ambulatory Visit: Payer: Self-pay

## 2024-01-16 DIAGNOSIS — Z79899 Other long term (current) drug therapy: Secondary | ICD-10-CM | POA: Insufficient documentation

## 2024-01-16 DIAGNOSIS — R4182 Altered mental status, unspecified: Secondary | ICD-10-CM

## 2024-01-16 DIAGNOSIS — Z85820 Personal history of malignant melanoma of skin: Secondary | ICD-10-CM | POA: Diagnosis not present

## 2024-01-16 DIAGNOSIS — I672 Cerebral atherosclerosis: Secondary | ICD-10-CM | POA: Diagnosis not present

## 2024-01-16 DIAGNOSIS — I10 Essential (primary) hypertension: Secondary | ICD-10-CM | POA: Diagnosis not present

## 2024-01-16 DIAGNOSIS — E871 Hypo-osmolality and hyponatremia: Secondary | ICD-10-CM | POA: Diagnosis not present

## 2024-01-16 DIAGNOSIS — Z87891 Personal history of nicotine dependence: Secondary | ICD-10-CM | POA: Diagnosis not present

## 2024-01-16 DIAGNOSIS — R531 Weakness: Secondary | ICD-10-CM | POA: Diagnosis not present

## 2024-01-16 DIAGNOSIS — G9340 Encephalopathy, unspecified: Secondary | ICD-10-CM | POA: Diagnosis not present

## 2024-01-16 DIAGNOSIS — E785 Hyperlipidemia, unspecified: Secondary | ICD-10-CM | POA: Diagnosis not present

## 2024-01-16 DIAGNOSIS — R059 Cough, unspecified: Secondary | ICD-10-CM | POA: Diagnosis not present

## 2024-01-16 LAB — CBC WITH DIFFERENTIAL/PLATELET
Abs Immature Granulocytes: 0.03 K/uL (ref 0.00–0.07)
Basophils Absolute: 0 K/uL (ref 0.0–0.1)
Basophils Relative: 0 %
Eosinophils Absolute: 0 K/uL (ref 0.0–0.5)
Eosinophils Relative: 0 %
HCT: 41.3 % (ref 36.0–46.0)
Hemoglobin: 14.3 g/dL (ref 12.0–15.0)
Immature Granulocytes: 0 %
Lymphocytes Relative: 11 %
Lymphs Abs: 0.9 K/uL (ref 0.7–4.0)
MCH: 30.8 pg (ref 26.0–34.0)
MCHC: 34.6 g/dL (ref 30.0–36.0)
MCV: 88.8 fL (ref 80.0–100.0)
Monocytes Absolute: 0.8 K/uL (ref 0.1–1.0)
Monocytes Relative: 10 %
Neutro Abs: 6.3 K/uL (ref 1.7–7.7)
Neutrophils Relative %: 79 %
Platelets: 190 K/uL (ref 150–400)
RBC: 4.65 MIL/uL (ref 3.87–5.11)
RDW: 12.7 % (ref 11.5–15.5)
WBC: 8.1 K/uL (ref 4.0–10.5)
nRBC: 0 % (ref 0.0–0.2)

## 2024-01-16 LAB — COMPREHENSIVE METABOLIC PANEL WITH GFR
ALT: 19 U/L (ref 0–44)
AST: 22 U/L (ref 15–41)
Albumin: 4.1 g/dL (ref 3.5–5.0)
Alkaline Phosphatase: 52 U/L (ref 38–126)
Anion gap: 14 (ref 5–15)
BUN: 14 mg/dL (ref 8–23)
CO2: 23 mmol/L (ref 22–32)
Calcium: 9.8 mg/dL (ref 8.9–10.3)
Chloride: 95 mmol/L — ABNORMAL LOW (ref 98–111)
Creatinine, Ser: 0.82 mg/dL (ref 0.44–1.00)
GFR, Estimated: >60 mL/min (ref >60)
Glucose, Bld: 119 mg/dL — ABNORMAL HIGH (ref 70–99)
Potassium: 3.9 mmol/L (ref 3.5–5.1)
Sodium: 132 mmol/L — ABNORMAL LOW (ref 135–145)
Total Bilirubin: 0.6 mg/dL (ref 0.0–1.2)
Total Protein: 7 g/dL (ref 6.5–8.1)

## 2024-01-16 LAB — URINALYSIS, ROUTINE W REFLEX MICROSCOPIC
Bilirubin Urine: NEGATIVE
Glucose, UA: NEGATIVE mg/dL
Hgb urine dipstick: NEGATIVE
Ketones, ur: 5 mg/dL — AB
Leukocytes,Ua: NEGATIVE
Nitrite: NEGATIVE
Protein, ur: NEGATIVE mg/dL
Specific Gravity, Urine: 1.021 (ref 1.005–1.030)
pH: 7 (ref 5.0–8.0)

## 2024-01-16 LAB — RESP PANEL BY RT-PCR (RSV, FLU A&B, COVID)  RVPGX2
Influenza A by PCR: NEGATIVE
Influenza B by PCR: NEGATIVE
Resp Syncytial Virus by PCR: NEGATIVE
SARS Coronavirus 2 by RT PCR: NEGATIVE

## 2024-01-16 NOTE — ED Provider Notes (Signed)
 [facility] Provider Note    Event Date/Time   First MD Initiated Contact with Patient 01/16/24 2045     (approximate)   History   Chief Complaint Altered Mental Status   HPI  Susan Holland is a 78 y.o. female with past medical history of hypertension, hyperlipidemia, diabetes, and melanoma who presents to the ED complaining of altered mental status.  Son at bedside reports that patient has been more confused than usual over the past 24 to 48 hours.  He is concerned that she may have a urinary tract infection as she has acted similarly in the past and he feels that her urine has had a foul odor.  Patient currently denies any complaints, is not sure why she is here.     Physical Exam   Triage Vital Signs: ED Triage Vitals  Encounter Vitals Group     BP      Girls Systolic BP Percentile      Girls Diastolic BP Percentile      Boys Systolic BP Percentile      Boys Diastolic BP Percentile      Pulse      Resp      Temp      Temp src      SpO2      Weight      Height      Head Circumference      Peak Flow      Pain Score      Pain Loc      Pain Education      Exclude from Growth Chart     Most recent vital signs: Vitals:   01/16/24 2054 01/16/24 2230  BP: (!) 143/80 133/78  Pulse:  74  Resp:  16  Temp:    SpO2:  96%    Constitutional: Alert and oriented to person and place, but not time or situation. Eyes: Conjunctivae are normal. Head: Atraumatic. Nose: No congestion/rhinnorhea. Mouth/Throat: Mucous membranes are moist.  Cardiovascular: Normal rate, regular rhythm. Grossly normal heart sounds.  2+ radial pulses bilaterally. Respiratory: Normal respiratory effort.  No retractions. Lungs CTAB. Gastrointestinal: Soft and nontender. No distention. Musculoskeletal: No lower extremity tenderness nor edema.  Neurologic:  Normal speech and language. No gross focal neurologic deficits are appreciated.    ED Results / Procedures /  Treatments   Labs (all labs ordered are listed, but only abnormal results are displayed) Labs Reviewed  COMPREHENSIVE METABOLIC PANEL WITH GFR - Abnormal; Notable for the following components:      Result Value   Sodium 132 (*)    Chloride 95 (*)    Glucose, Bld 119 (*)    All other components within normal limits  URINALYSIS, ROUTINE W REFLEX MICROSCOPIC - Abnormal; Notable for the following components:   Color, Urine YELLOW (*)    APPearance HAZY (*)    Ketones, ur 5 (*)    All other components within normal limits  RESP PANEL BY RT-PCR (RSV, FLU A&B, COVID)  [id number]  CBC WITH DIFFERENTIAL/PLATELET     EKG  ED ECG REPORT I, Carlin Palin, the attending physician, personally viewed and interpreted this ECG.   Date: 01/16/2024  EKG Time: 21:44  Rate: 80  Rhythm: normal sinus rhythm  Axis: LAD  Intervals:none  ST&T Change: None  RADIOLOGY CT head reviewed and interpreted by me with no hemorrhage or midline shift.  PROCEDURES:  Critical Care performed: No  Procedures   MEDICATIONS ORDERED IN  ED: Medications - No data to display   IMPRESSION / MDM / ASSESSMENT AND PLAN / ED COURSE  I reviewed the triage vital signs and the nursing notes.                              78 y.o. female with past medical history of hypertension, hyperlipidemia, diabetes, and melanoma who presents to the ED complaining of confusion over the past 2 days with foul-smelling urine.  Patient's presentation is most consistent with acute presentation with potential threat to life or bodily function.  Differential diagnosis includes, but is not limited to, stroke, TIA, anemia, electrolyte abnormality, AKI, UTI.  Patient nontoxic-appearing and in no acute distress, vital signs are unremarkable.  She is currently only oriented to person and place, but has a nonfocal neurologic exam.  We will screen EKG, labs, and urinalysis.  Plan to check CT head if UTI cannot explain her altered mental  status.  Urinalysis without evidence of UTI, CT head also negative for acute finding.  Labs without significant anemia, leukocytosis, electrolyte abnormality, or AKI.  Patient denies any recent medication changes and no clear cause for her altered mental status identified at this time.  Given acute change, case discussed with hospitalist for admission.      FINAL CLINICAL IMPRESSION(S) / ED DIAGNOSES   Final diagnoses:  Altered mental status, unspecified altered mental status type     Rx / DC Orders   ED Discharge Orders     None        Note:  This document was prepared using Dragon voice recognition software and may include unintentional dictation errors.   Willo Dunnings, MD 01/16/24 [id number]

## 2024-01-16 NOTE — ED Triage Notes (Signed)
 Pt BIB EMS from home. Pt's son reports that pt has not been herself x2 days. Pt reports some urinary incontinence today, which is also abnormal for her. Family reports that her urine also has a foul odor. Pt A&Ox2 currently, but at baseline x4.

## 2024-01-17 ENCOUNTER — Encounter: Payer: Self-pay | Admitting: Pharmacist

## 2024-01-17 ENCOUNTER — Telehealth: Payer: Self-pay | Admitting: Internal Medicine

## 2024-01-17 ENCOUNTER — Encounter: Payer: Self-pay | Admitting: Family Medicine

## 2024-01-17 ENCOUNTER — Other Ambulatory Visit: Payer: Self-pay

## 2024-01-17 DIAGNOSIS — G9340 Encephalopathy, unspecified: Principal | ICD-10-CM | POA: Diagnosis present

## 2024-01-17 DIAGNOSIS — E785 Hyperlipidemia, unspecified: Secondary | ICD-10-CM | POA: Insufficient documentation

## 2024-01-17 LAB — CBC
HCT: 41.5 % (ref 36.0–46.0)
Hemoglobin: 13.7 g/dL (ref 12.0–15.0)
MCH: 29.7 pg (ref 26.0–34.0)
MCHC: 33 g/dL (ref 30.0–36.0)
MCV: 90 fL (ref 80.0–100.0)
Platelets: 183 K/uL (ref 150–400)
RBC: 4.61 MIL/uL (ref 3.87–5.11)
RDW: 12.7 % (ref 11.5–15.5)
WBC: 6.6 K/uL (ref 4.0–10.5)
nRBC: 0 % (ref 0.0–0.2)

## 2024-01-17 LAB — BASIC METABOLIC PANEL WITH GFR
Anion gap: 11 (ref 5–15)
BUN: 13 mg/dL (ref 8–23)
CO2: 25 mmol/L (ref 22–32)
Calcium: 9.5 mg/dL (ref 8.9–10.3)
Chloride: 98 mmol/L (ref 98–111)
Creatinine, Ser: 0.72 mg/dL (ref 0.44–1.00)
GFR, Estimated: >60 mL/min (ref >60)
Glucose, Bld: 88 mg/dL (ref 70–99)
Potassium: 3.6 mmol/L (ref 3.5–5.1)
Sodium: 134 mmol/L — ABNORMAL LOW (ref 135–145)

## 2024-01-17 MED ORDER — HYDROCHLOROTHIAZIDE 12.5 MG PO TABS
12.5000 mg | ORAL_TABLET | Freq: Every day | ORAL | Status: DC
  Administered 2024-01-17: 12.5 mg via ORAL
  Filled 2024-01-17: qty 1

## 2024-01-17 MED ORDER — OYSTER SHELL CALCIUM/D3 500-5 MG-MCG PO TABS
2.0000 | ORAL_TABLET | Freq: Two times a day (BID) | ORAL | Status: DC
  Administered 2024-01-17: 2 via ORAL
  Filled 2024-01-17: qty 2

## 2024-01-17 MED ORDER — ADULT MULTIVITAMIN W/MINERALS CH
1.0000 | ORAL_TABLET | Freq: Every day | ORAL | Status: DC
  Administered 2024-01-17: 1 via ORAL
  Filled 2024-01-17: qty 1

## 2024-01-17 MED ORDER — ACETAMINOPHEN 650 MG RE SUPP: 650.0000 mg | Freq: Four times a day (QID) | RECTAL | Status: DC | PRN

## 2024-01-17 MED ORDER — ONDANSETRON HCL 4 MG/2ML IJ SOLN: 4.0000 mg | Freq: Four times a day (QID) | INTRAMUSCULAR | Status: DC | PRN

## 2024-01-17 MED ORDER — SODIUM CHLORIDE 0.9 % IV SOLN: INTRAVENOUS | Status: DC

## 2024-01-17 MED ORDER — LOSARTAN POTASSIUM 50 MG PO TABS
100.0000 mg | ORAL_TABLET | Freq: Every day | ORAL | Status: DC
  Administered 2024-01-17: 100 mg via ORAL
  Filled 2024-01-17: qty 2

## 2024-01-17 MED ORDER — ONDANSETRON HCL 4 MG PO TABS: 4.0000 mg | ORAL_TABLET | Freq: Four times a day (QID) | ORAL | Status: DC | PRN

## 2024-01-17 MED ORDER — TRAZODONE HCL 50 MG PO TABS: 25.0000 mg | ORAL_TABLET | Freq: Every evening | ORAL | Status: DC | PRN

## 2024-01-17 MED ORDER — ENOXAPARIN SODIUM 40 MG/0.4ML IJ SOSY
40.0000 mg | PREFILLED_SYRINGE | INTRAMUSCULAR | Status: DC
  Administered 2024-01-17: 40 mg via SUBCUTANEOUS
  Filled 2024-01-17: qty 0.4

## 2024-01-17 MED ORDER — LOSARTAN POTASSIUM-HCTZ 100-12.5 MG PO TABS: 1.0000 | ORAL_TABLET | Freq: Every day | ORAL | Status: DC

## 2024-01-17 MED ORDER — ATORVASTATIN CALCIUM 20 MG PO TABS: 20.0000 mg | ORAL_TABLET | Freq: Every evening | ORAL | Status: DC

## 2024-01-17 MED ORDER — MAGNESIUM HYDROXIDE 400 MG/5ML PO SUSP: 30.0000 mL | Freq: Every day | ORAL | Status: DC | PRN

## 2024-01-17 MED ORDER — ACETAMINOPHEN 325 MG PO TABS: 650.0000 mg | ORAL_TABLET | Freq: Four times a day (QID) | ORAL | Status: DC | PRN

## 2024-01-17 MED ORDER — LOSARTAN POTASSIUM 100 MG PO TABS
100.0000 mg | ORAL_TABLET | Freq: Every day | ORAL | 0 refills | Status: AC
  Filled 2024-01-17: qty 30, 30d supply, fill #0

## 2024-01-17 MED ORDER — OMEGA-3-ACID ETHYL ESTERS 1 G PO CAPS
1.0000 g | ORAL_CAPSULE | Freq: Two times a day (BID) | ORAL | Status: DC
  Administered 2024-01-17: 1 g via ORAL
  Filled 2024-01-17: qty 1

## 2024-01-17 NOTE — Assessment & Plan Note (Addendum)
Will continue statin therapy and Lovaza

## 2024-01-17 NOTE — Assessment & Plan Note (Signed)
-   We will continue antihypertensive therapy.

## 2024-01-17 NOTE — Discharge Summary (Signed)
 Physician Discharge Summary   Patient: Susan Holland MRN: [id number] DOB: 04-Jul-1945  Admit date:     01/16/2024  Discharge date: 01/17/2024  Discharge Physician: Murvin Mana   PCP: Geofm Glade PARAS, MD   Recommendations at discharge:   Follow-up with PCP in 1 week.  Discharge Diagnoses: Principal Problem:   Acute encephalopathy Active Problems:   Essential hypertension   Dyslipidemia Hyponatremia  Resolved Problems:   * No resolved hospital problems. *  Hospital Course: Susan Holland is a 78 y.o. Caucasian female with medical history significant for diverticulosis, hypertension, dyslipidemia, melanoma and osteoporosis, who presented to the ER with acute onset of altered mental status with confusion which has been worsening over the last 24 to 48 hours.  Her son was concerned about UTI as he noted foul-smelling urine order.  She denies any urinary frequency or urgency, dysuria or hematuria or flank pain.  UA was also negative. CT of the head without contrast did not show any acute changes, chest x-ray showed no acute changes. Lab test showed an abnormal white cell count, mild hyponatremia.  Respiratory panel was negative. Patient appeared to have some dehydration, he received fluid bolus and fluid infusion.  Her mental status had improved today.  She currently does not have any GI symptoms, no respiratory symptoms, no urinary symptoms, no fever. Mild dehydration may have contributed to her symptoms, I also discussed with the family to watch for any sign of a memory symptoms.  And refer to PCP to screen for dementia.  I have temporarily removed HCTZ from her blood pressure regimen. Currently, patient is medically stable for discharge.        Consultants: None Procedures performed: None  Disposition: Home Diet recommendation:  Cardiac diet DISCHARGE MEDICATION: Allergies as of 01/17/2024   No Known Allergies      Medication List     STOP taking these medications     losartan -hydrochlorothiazide  100-12.5 MG tablet Commonly known as: HYZAAR       TAKE these medications    atorvastatin  20 MG tablet Commonly known as: LIPITOR Take 1 tablet (20 mg total) by mouth every evening.   CALCIUM  + D PO Take 1 tablet by mouth 2 (two) times daily.   Fish Oil 1200 MG Caps Take 1,200 mg by mouth 2 (two) times daily.   losartan  100 MG tablet Commonly known as: COZAAR  Take 1 tablet (100 mg total) by mouth daily. Start taking on: January 18, 2024   Mounjaro  5 MG/0.5ML Pen Generic drug: tirzepatide  INJECT 5 MG INTO THE SKIN ONCE A WEEK.   multivitamin with minerals Tabs tablet Take 1 tablet by mouth daily.        Follow-up Information     Geofm Glade PARAS, MD Follow up in 1 week(s).   Specialty: Internal Medicine Contact information: [address] [place] [address] [phone]                Discharge Exam: Fredricka Weights   01/16/24 2052 01/17/24 0114  Weight: 77.1 kg 77.5 kg   General exam: Appears calm and comfortable  Respiratory system: Clear to auscultation. Respiratory effort normal. Cardiovascular system: S1 & S2 heard, RRR. No JVD, murmurs, rubs, gallops or clicks. No pedal edema. Gastrointestinal system: Abdomen is nondistended, soft and nontender. No organomegaly or masses felt. Normal bowel sounds heard. Central nervous system: Alert and oriented. No focal neurological deficits. Extremities: Symmetric 5 x 5 power. Skin: No rashes, lesions or ulcers Psychiatry: Judgement and insight  appear normal. Mood & affect appropriate.    Condition at discharge: good  The results of significant diagnostics from this hospitalization (including imaging, microbiology, ancillary and laboratory) are listed below for reference.   Imaging Studies: CT Head Wo Contrast Result Date: 01/16/2024 EXAM: CT HEAD WITHOUT 01/16/2024 11:03:24 PM TECHNIQUE: CT of the head was performed without the administration of intravenous contrast.  Automated exposure control, iterative reconstruction, and/or weight based adjustment of the mA/kV was utilized to reduce the radiation dose to as low as reasonably achievable. COMPARISON: 01/03/2021 CLINICAL HISTORY: Mental status change, unknown cause FINDINGS: BRAIN AND VENTRICLES: No acute intracranial hemorrhage. No mass effect or midline shift. No extra-axial fluid collection. No evidence of acute infarct. No hydrocephalus. Patchy and confluent decreased attenuation throughout deep and periventricular white matter of cerebral hemispheres bilaterally, compatible with chronic microvascular ischemic disease. Cerebral ventricle sizes concordant with degree of cerebral volume loss. Atherosclerotic calcifications within cavernous internal carotid and vertebral arteries. ORBITS: No acute abnormality. Bilateral lens replacement. SINUSES AND MASTOIDS: Partial right sphenoid sinus opacification. Mucosal thickening in bilateral ethmoid and maxillary sinuses. SOFT TISSUES AND SKULL: No acute skull fracture. No acute soft tissue abnormality. IMPRESSION: 1. No acute intracranial abnormality. Electronically signed by: Morgane Naveau MD 01/16/2024 11:06 PM EST RP Workstation: [id number]   DG Chest 2 View Result Date: 01/16/2024 EXAM: 2 VIEW(S) XRAY OF THE CHEST 01/16/2024 09:51:00 PM COMPARISON: 01/03/2021 CLINICAL HISTORY: Weakness, cough FINDINGS: LUNGS AND PLEURA: No focal pulmonary opacity. No pleural effusion. No pneumothorax. HEART AND MEDIASTINUM: No acute abnormality of the cardiac and mediastinal silhouettes. BONES AND SOFT TISSUES: No acute osseous abnormality. IMPRESSION: 1. No acute cardiopulmonary process. Electronically signed by: Franky Crease MD 01/16/2024 09:55 PM EST RP Workstation: [id number]    Microbiology: Results for orders placed or performed during the hospital encounter of 01/16/24  Resp panel by RT-PCR (RSV, Flu A&B, Covid) Urine, Clean Catch     Status: None   Collection Time: 01/16/24  9:55  PM   Specimen: Urine, Clean Catch; Nasal Swab  Result Value Ref Range Status   SARS Coronavirus 2 by RT PCR NEGATIVE NEGATIVE Final    Comment: (NOTE) SARS-CoV-2 target nucleic acids are NOT DETECTED.  The SARS-CoV-2 RNA is generally detectable in upper respiratory specimens during the acute phase of infection. The lowest concentration of SARS-CoV-2 viral copies this assay can detect is 138 copies/mL. A negative result does not preclude SARS-Cov-2 infection and should not be used as the sole basis for treatment or other patient management decisions. A negative result may occur with  improper specimen collection/handling, submission of specimen other than nasopharyngeal swab, presence of viral mutation(s) within the areas targeted by this assay, and inadequate number of viral copies(<138 copies/mL). A negative result must be combined with clinical observations, patient history, and epidemiological information. The expected result is Negative.  Fact Sheet for Patients:  bloggercourse.com  Fact Sheet for Healthcare Providers:  seriousbroker.it  This test is no t yet approved or cleared by the [place]  FDA and  has been authorized for detection and/or diagnosis of SARS-CoV-2 by FDA under an Emergency Use Authorization (EUA). This EUA will remain  in effect (meaning this test can be used) for the duration of the COVID-19 declaration under Section 564(b)(1) of the Act, 21 U.S.C.section 360bbb-3(b)(1), unless the authorization is terminated  or revoked sooner.       Influenza A by PCR NEGATIVE NEGATIVE Final   Influenza B by PCR NEGATIVE NEGATIVE Final    Comment: (NOTE) The  Xpert Xpress SARS-CoV-2/FLU/RSV plus assay is intended as an aid in the diagnosis of influenza from Nasopharyngeal swab specimens and should not be used as a sole basis for treatment. Nasal washings and aspirates are unacceptable for Xpert Xpress  SARS-CoV-2/FLU/RSV testing.  Fact Sheet for Patients: bloggercourse.com  Fact Sheet for Healthcare Providers: seriousbroker.it  This test is not yet approved or cleared by the [place]  FDA and has been authorized for detection and/or diagnosis of SARS-CoV-2 by FDA under an Emergency Use Authorization (EUA). This EUA will remain in effect (meaning this test can be used) for the duration of the COVID-19 declaration under Section 564(b)(1) of the Act, 21 U.S.C. section 360bbb-3(b)(1), unless the authorization is terminated or revoked.     Resp Syncytial Virus by PCR NEGATIVE NEGATIVE Final    Comment: (NOTE) Fact Sheet for Patients: bloggercourse.com  Fact Sheet for Healthcare Providers: seriousbroker.it  This test is not yet approved or cleared by the [place]  FDA and has been authorized for detection and/or diagnosis of SARS-CoV-2 by FDA under an Emergency Use Authorization (EUA). This EUA will remain in effect (meaning this test can be used) for the duration of the COVID-19 declaration under Section 564(b)(1) of the Act, 21 U.S.C. section 360bbb-3(b)(1), unless the authorization is terminated or revoked.  Performed at [facility], [address] [place], [place] [address]     Labs: CBC: Recent Labs  Lab 01/16/24 2145 01/17/24 0553  WBC 8.1 6.6  NEUTROABS 6.3  --   HGB 14.3 13.7  HCT 41.3 41.5  MCV 88.8 90.0  PLT 190 183   Basic Metabolic Panel: Recent Labs  Lab 01/16/24 2145 01/17/24 0553  NA 132* 134*  K 3.9 3.6  CL 95* 98  CO2 23 25  GLUCOSE 119* 88  BUN 14 13  CREATININE 0.82 0.72  CALCIUM  9.8 9.5   Liver Function Tests: Recent Labs  Lab 01/16/24 2145  AST 22  ALT 19  ALKPHOS 52  BILITOT 0.6  PROT 7.0  ALBUMIN  4.1   CBG: No results for input(s): GLUCAP in the last 168 hours.  Discharge time spent: 35  minutes.  Signed: Murvin Mana, MD [facility] 01/17/2024

## 2024-01-17 NOTE — Plan of Care (Signed)

## 2024-01-17 NOTE — Assessment & Plan Note (Signed)
-   This is of unclear etiology. - The patient will be admitted to an observation medical telemetry bed. - Will follow neurochecks every 4 hours for 24 hours. - Will continue monitoring her mental status. - PT evaluation will be obtained

## 2024-01-17 NOTE — Progress Notes (Signed)
 TOC Brief Assessment Note Medical record reviewed. Patient is from home with no home services. Patient admitted with metabolic encephalopathy. No recs at this time. Please outreach to [facility] if needs are identified.

## 2024-01-17 NOTE — Progress Notes (Signed)
 Pharmacy Quality Measure Review  This patient is appearing on a report for being at risk of failing the adherence measure for diabetes medications this calendar year.   Medication: Mounjaro  Last fill date: 12/26/23 for 28 day supply  Insurance report was not up to date. No action needed at this time.   Darrelyn Drum, PharmD, BCPS, CPP Clinical Pharmacist Practitioner [facility] [phone]

## 2024-01-17 NOTE — Telephone Encounter (Signed)
 Patient is coming in Tuesday for hospital follow up. She was discharged from [facility] today. Patient's daughter Rodena, on [place]) wants to make sure that a test for dementia is performed at the visit on Tuesday. If you have any questions, please call Christina at [phone]. Patient will be filling out a MyChart proxy form when she comes in on Tuesday.

## 2024-01-17 NOTE — H&P (Signed)
 Susan Holland   PATIENT NAME: Susan Holland    MR#:  [id number]  DATE OF BIRTH:  Susan Holland 27, 1947  DATE OF ADMISSION:  01/16/2024  PRIMARY CARE PHYSICIAN: Geofm Glade PARAS, MD   Patient is coming from: Home  REQUESTING/REFERRING PHYSICIAN: Dorothyann Drivers, MD  CHIEF COMPLAINT:   Chief Complaint  Patient presents with   Altered Mental Status    HISTORY OF PRESENT ILLNESS:  Zane C Lubinski is a 78 y.o. Caucasian female with medical history significant for diverticulosis, hypertension, dyslipidemia, melanoma and osteoporosis, who presented to the ER with acute onset of altered mental status with confusion which has been worsening over the last 24 to 48 hours.  Her son was concerned about UTI as he noted foul-smelling urine order.  She denies any urinary frequency or urgency, dysuria or hematuria or flank pain.  No fever or chills.  She has a slight dry cough with no wheezing or dyspnea.  No chest pain or palpitations.  No paresthesias or focal muscle weakness.  No dysphagia or dysarthria.  No tinnitus or vertigo.  No urinary or stool incontinence.  No bleeding diathesis.  No neck pain or stiffness.  ED Course: When she came to the ER, BP was 143/80 with otherwise normal vital signs.  Labs revealed mild hyponatremia 132 and hypochloremia 95 with a blood glucose of 119 and an otherwise unremarkable CMP.  CBC was normal.  Respiratory panel came back negative and UA was negative. EKG as reviewed by me : EKG showed normal sinus rhythm with a rate of 80 with probable left atrial enlargement and poor R wave progression with LVH. Imaging: None contrasted CT scan showed no acute intracranial abnormalities.  2 view chest x-ray showed no acute cardiopulmonary disease.  The patient will be admitted to a medical telemetry observation bed for further evaluation and management. 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 ; benign   TONSILLECTOMY AND ADENOIDECTOMY     TOTAL HIP ARTHROPLASTY Right 10/14/2018   Procedure: RIght Anterior Hip Arthroplasty;  Surgeon: Sheril Coy, MD;  Location: [facility];  Service: Orthopedics;  Laterality: Right;   TUBAL LIGATION      SOCIAL HISTORY:   Social History   Tobacco Use   Smoking status: Former    Current packs/day: 0.00    Types: Cigarettes    Quit date: 03/29/2015    Years since quitting: 8.8    Passive exposure: Never   Smokeless tobacco: Never   Tobacco comments:    smoked 1963- present, up to 1/2 ppd  Substance Use Topics   Alcohol use: Yes    Alcohol/week: 0.0 standard drinks of alcohol    Comment:  occasionally     FAMILY HISTORY:   Family History  Problem Relation Age of Onset   Dementia Mother        Alsheimer's   Stroke Father  38   Diabetes Sister    Heart disease Sister        AF   Colon cancer Maternal Uncle        X 2   Esophageal cancer Neg Hx    Rectal cancer Neg Hx    Stomach cancer Neg Hx     DRUG ALLERGIES:  Allergies[1]  REVIEW OF SYSTEMS:   ROS As per history of present illness. All pertinent systems were reviewed above. Constitutional, HEENT, cardiovascular, respiratory, GI, GU, musculoskeletal, neuro, psychiatric, endocrine, integumentary and hematologic systems were reviewed and are otherwise negative/unremarkable except for positive findings mentioned above in the HPI.   MEDICATIONS AT HOME:   Prior to Admission medications  Medication Sig Start Date End Date Taking? Authorizing Provider  atorvastatin   (LIPITOR) 20 MG tablet Take 1 tablet (20 mg total) by mouth every evening. 08/21/23   Geofm Glade PARAS, MD  Calcium  Carbonate-Vitamin D  (CALCIUM  + D PO) Take 1 tablet by mouth 2 (two) times daily.     [provider]  losartan -hydrochlorothiazide  (HYZAAR) 100-12.5 MG tablet Take 1 tablet by mouth daily. 08/20/23   Geofm Glade PARAS, MD  MOUNJARO  5 MG/0.5ML Pen INJECT 5 MG INTO THE SKIN ONCE A WEEK. 12/26/23   Geofm Glade PARAS, MD  Multiple Vitamin (MULTIVITAMIN WITH MINERALS) TABS tablet Take 1 tablet by mouth daily.    [provider]  Omega-3 Fatty Acids (FISH OIL) 1200 MG CAPS Take 1,200 mg by mouth 2 (two) times daily.    [provider]      VITAL SIGNS:  Blood pressure 137/75, pulse 72, temperature (!) 97.2 F (36.2 C), resp. rate 16, height 5' 4.5 (1.638 m), weight 77.5 kg, SpO2 94%.  PHYSICAL EXAMINATION:  Physical Exam  GENERAL:  78 y.o.-year-old patient lying in the bed with no acute distress.  EYES: Pupils equal, round, reactive to light and accommodation. No scleral icterus. Extraocular muscles intact.  HEENT: Head atraumatic, normocephalic. Oropharynx and nasopharynx clear.  NECK:  Supple, no jugular venous distention. No thyroid  enlargement, no tenderness.  LUNGS: Normal breath sounds bilaterally, no wheezing, rales,rhonchi or crepitation. No use of accessory muscles of respiration.  CARDIOVASCULAR: Regular rate and rhythm, S1, S2 normal. No murmurs, rubs, or gallops.  ABDOMEN: Soft, nondistended, nontender. Bowel sounds present. No organomegaly or mass.  EXTREMITIES: No pedal edema, cyanosis, or clubbing.  NEUROLOGIC: Cranial nerves II through XII are intact. Muscle strength 5/5 in all extremities. Sensation intact. Gait not checked.  PSYCHIATRIC: The patient is alert and oriented x 3.  Normal affect and good eye contact. SKIN: No obvious rash, lesion, or ulcer.   LABORATORY PANEL:   CBC Recent Labs  Lab 01/16/24 2145  WBC 8.1  HGB 14.3  HCT 41.3   PLT 190   ------------------------------------------------------------------------------------------------------------------  Chemistries  Recent Labs  Lab 01/16/24 2145  NA 132*  K 3.9  CL 95*  CO2 23  GLUCOSE 119*  BUN 14  CREATININE 0.82  CALCIUM  9.8  AST 22  ALT 19  ALKPHOS 52  BILITOT 0.6   ------------------------------------------------------------------------------------------------------------------  Cardiac Enzymes No results for input(s): TROPONINI in the last 168 hours. ------------------------------------------------------------------------------------------------------------------  RADIOLOGY:  CT Head Wo Contrast Result Date: 01/16/2024 EXAM: CT HEAD WITHOUT 01/16/2024 11:03:24 PM TECHNIQUE: CT of the head was performed without the administration of intravenous contrast. Automated exposure control, iterative reconstruction, and/or weight based adjustment of the mA/kV was utilized to reduce the radiation dose to as low as reasonably achievable. COMPARISON: 01/03/2021 CLINICAL HISTORY: Mental  status change, unknown cause FINDINGS: BRAIN AND VENTRICLES: No acute intracranial hemorrhage. No mass effect or midline shift. No extra-axial fluid collection. No evidence of acute infarct. No hydrocephalus. Patchy and confluent decreased attenuation throughout deep and periventricular white matter of cerebral hemispheres bilaterally, compatible with chronic microvascular ischemic disease. Cerebral ventricle sizes concordant with degree of cerebral volume loss. Atherosclerotic calcifications within cavernous internal carotid and vertebral arteries. ORBITS: No acute abnormality. Bilateral lens replacement. SINUSES AND MASTOIDS: Partial right sphenoid sinus opacification. Mucosal thickening in bilateral ethmoid and maxillary sinuses. SOFT TISSUES AND SKULL: No acute skull fracture. No acute soft tissue abnormality. IMPRESSION: 1. No acute intracranial abnormality. Electronically  signed by: Morgane Naveau MD 01/16/2024 11:06 PM EST RP Workstation: [id number]   DG Chest 2 View Result Date: 01/16/2024 EXAM: 2 VIEW(S) XRAY OF THE CHEST 01/16/2024 09:51:00 PM COMPARISON: 01/03/2021 CLINICAL HISTORY: Weakness, cough FINDINGS: LUNGS AND PLEURA: No focal pulmonary opacity. No pleural effusion. No pneumothorax. HEART AND MEDIASTINUM: No acute abnormality of the cardiac and mediastinal silhouettes. BONES AND SOFT TISSUES: No acute osseous abnormality. IMPRESSION: 1. No acute cardiopulmonary process. Electronically signed by: Franky Crease MD 01/16/2024 09:55 PM EST RP Workstation: [id number]      IMPRESSION AND PLAN:  Assessment and Plan: * Acute encephalopathy - This is of unclear etiology. - The patient will be admitted to an observation medical telemetry bed. - Will follow neurochecks every 4 hours for 24 hours. - Will continue monitoring her mental status. - PT evaluation will be obtained  Dyslipidemia - Will continue statin therapy and Lovaza.  Essential hypertension - We will continue antihypertensive therapy.    DVT prophylaxis: Lovenox.  Advanced Care Planning:  Code Status: full code.  Family Communication:  The plan of care was discussed in details with the patient (and family). I answered all questions. The patient agreed to proceed with the above mentioned plan. Further management will depend upon hospital course. Disposition Plan: Back to previous home environment Consults called: none.  All the records are reviewed and case discussed with ED provider.  Status is: Observation  I certify that at the time of admission, it is my clinical judgment that the patient will require hospital care extending less than 2 midnights.                            Dispo: The patient is from: Home              Anticipated d/c is to: Home              Patient currently is not medically stable to d/c.              Difficult to place patient: No  Madison DELENA Peaches M.D on  01/17/2024 at 6:01 AM  [facility]   From 7 PM-7 AM, contact night-coverage [email or web address]  CC: Primary care physician; Geofm Glade PARAS, MD     [1] No Known Allergies

## 2024-01-20 ENCOUNTER — Telehealth: Payer: Self-pay

## 2024-01-20 ENCOUNTER — Encounter: Payer: Self-pay | Admitting: Internal Medicine

## 2024-01-20 NOTE — Transitions of Care (Post Inpatient/ED Visit) (Signed)
° °  01/20/2024  Name: Susan Holland MRN: [id number] DOB: 23-Feb-1945  Today's TOC FU Call Status: Today's TOC FU Call Status:: Successful TOC FU Call Completed TOC FU Call Complete Date: 01/20/24  Patient's Name and Date of Birth confirmed. Name, DOB  Transition Care Management Follow-up Telephone Call Date of Discharge: 01/17/24 Discharge Facility: [facility]) Type of Discharge: Inpatient Admission Primary Inpatient Discharge Diagnosis:: altered mental status How have you been since you were released from the hospital?: Better Any questions or concerns?: No  Items Reviewed: Did you receive and understand the discharge instructions provided?: Yes Medications obtained,verified, and reconciled?: Yes (Medications Reviewed) Any new allergies since your discharge?: No Dietary orders reviewed?: Yes Do you have support at home?: No  Medications Reviewed Today: Medications Reviewed Today     Reviewed by Emmitt Pan, LPN (Licensed Practical Nurse) on 01/20/24 at 1615  Med List Status: <None>   Medication Order Taking? Sig Documenting Provider Last Dose Status Informant  atorvastatin  (LIPITOR) 20 MG tablet [id number] Yes Take 1 tablet (20 mg total) by mouth every evening. Geofm Glade PARAS, MD  Active Self, Family Member  Calcium  Carbonate-Vitamin D  (CALCIUM  + D PO) [id number] Yes Take 1 tablet by mouth 2 (two) times daily.  [provider]  Active Self, Family Member  losartan  (COZAAR ) 100 MG tablet [id number] Yes Take 1 tablet (100 mg total) by mouth daily. Laurita Pillion, MD  Active   MOUNJARO  5 MG/0.5ML Pen [id number] Yes INJECT 5 MG INTO THE SKIN ONCE A WEEK. Geofm Glade PARAS, MD  Active Self, Family Member  Multiple Vitamin (MULTIVITAMIN WITH MINERALS) TABS tablet [id number] Yes Take 1 tablet by mouth daily. [provider]  Active Self, Family Member  Omega-3 Fatty Acids (FISH OIL) 1200 MG CAPS [id number] Yes Take 1,200 mg by mouth 2 (two) times  daily. [provider]  Active Self, Family Member            Home Care and Equipment/Supplies: Were Home Health Services Ordered?: NA Any new equipment or medical supplies ordered?: NA  Functional Questionnaire: Do you need assistance with bathing/showering or dressing?: No Do you need assistance with meal preparation?: No Do you need assistance with eating?: No Do you have difficulty maintaining continence: No Do you need assistance with getting out of bed/getting out of a chair/moving?: No Do you have difficulty managing or taking your medications?: No  Follow up appointments reviewed: PCP Follow-up appointment confirmed?: Yes Date of PCP follow-up appointment?: 01/21/24 Follow-up Provider: [facility] Follow-up appointment confirmed?: NA Do you need transportation to your follow-up appointment?: No Do you understand care options if your condition(s) worsen?: Yes-patient verbalized understanding    SIGNATURE Pan Emmitt, LPN [facility] Nurse Health Advisor Direct Dial [phone]

## 2024-01-20 NOTE — Progress Notes (Unsigned)
 Subjective:    Patient ID: Susan Holland, female    DOB: 07-Oct-1945, 78 y.o.   MRN: [id number]     HPI Loana is here for follow up from the hospital.  She is here with her niece.    ED 12/12-admitted for observation.  She presented with altered mental status.  It was acute onset.  Confusion had been worsening over the past 24-48 hours.  Her son had noted some foul-smelling urine and was concerned about a UTI.  She denied urinary frequency, urgency, dysuria, hematuria or flank pain.  There were no fevers or chills.  She had a slight dry cough without shortness of breath or wheeze.  She denied any other symptoms.  BP was slightly elevated at 143/80, other vital signs normal.  She had mild hyponatremia at 132, hypochloremia 95, glucose 119.  CBC normal.  Respiratory panel negative, UA negative.  EKG NSR without acute change.  CT head without acute abnormality.  CXR with no acute cardiopulmonary disease.  Acute encephalopathy Unclear etiology No evidence of infection Some dehydration-received IV fluids Mentation improved ?  Mild dehydration contributed to her symptoms Needs screening for dementia Discontinued hydrochlorothiazide     Medications and allergies reviewed with patient and updated if appropriate.  Medications Ordered Prior to Encounter[1]   Review of Systems  Constitutional:  Negative for fever.  HENT:  Negative for congestion, sinus pain and sore throat.   Respiratory:  Positive for cough. Negative for shortness of breath and wheezing.   Cardiovascular:  Negative for chest pain, palpitations and leg swelling.  Genitourinary:  Positive for difficulty urinating. Negative for dysuria, frequency and hematuria.  Neurological:  Negative for dizziness, light-headedness and headaches.       Objective:   Vitals:   01/21/24 1117  BP: 124/80  Pulse: 71  Temp: 98 F (36.7 C)  SpO2: 96%   BP Readings from Last 3 Encounters:  01/21/24 124/80  01/17/24 (!)  142/126  11/28/23 124/72   Wt Readings from Last 3 Encounters:  01/21/24 170 lb (77.1 kg)  01/17/24 170 lb 13.7 oz (77.5 kg)  11/28/23 172 lb (78 kg)   Body mass index is 28.29 kg/m.    Physical Exam Constitutional:      General: She is not in acute distress.    Appearance: Normal appearance.  HENT:     Head: Normocephalic and atraumatic.  Eyes:     Conjunctiva/sclera: Conjunctivae normal.  Cardiovascular:     Rate and Rhythm: Normal rate and regular rhythm.     Heart sounds: Normal heart sounds.  Pulmonary:     Effort: Pulmonary effort is normal. No respiratory distress.     Breath sounds: Normal breath sounds. No wheezing.  Musculoskeletal:     Cervical back: Neck supple.     Right lower leg: No edema.     Left lower leg: No edema.  Lymphadenopathy:     Cervical: No cervical adenopathy.  Skin:    General: Skin is warm and dry.     Findings: No rash.  Neurological:     Mental Status: She is alert. Mental status is at baseline.  Psychiatric:        Mood and Affect: Mood normal.        Behavior: Behavior normal.        Lab Results  Component Value Date   WBC 6.6 01/17/2024   HGB 13.7 01/17/2024   HCT 41.5 01/17/2024   PLT 183 01/17/2024  GLUCOSE 88 01/17/2024   CHOL 129 11/28/2023   TRIG 101.0 11/28/2023   HDL 48.30 11/28/2023   LDLDIRECT 159.4 03/19/2011   LDLCALC 61 11/28/2023   ALT 19 01/16/2024   AST 22 01/16/2024   NA 134 (L) 01/17/2024   K 3.6 01/17/2024   CL 98 01/17/2024   CREATININE 0.72 01/17/2024   BUN 13 01/17/2024   CO2 25 01/17/2024   TSH 2.08 11/28/2023   INR 1.0 10/08/2018   HGBA1C 6.1 11/28/2023   MICROALBUR <0.7 05/27/2023     Assessment & Plan:    See Problem List for Assessment and Plan of chronic medical problems.       [1]  Current Outpatient Medications on File Prior to Visit  Medication Sig Dispense Refill   atorvastatin  (LIPITOR) 20 MG tablet Take 1 tablet (20 mg total) by mouth every evening. 90 tablet 3    Calcium  Carbonate-Vitamin D  (CALCIUM  + D PO) Take 1 tablet by mouth 2 (two) times daily.      losartan  (COZAAR ) 100 MG tablet Take 1 tablet (100 mg total) by mouth daily. 30 tablet 0   MOUNJARO  5 MG/0.5ML Pen INJECT 5 MG INTO THE SKIN ONCE A WEEK. 6 mL 0   Multiple Vitamin (MULTIVITAMIN WITH MINERALS) TABS tablet Take 1 tablet by mouth daily.     Omega-3 Fatty Acids (FISH OIL) 1200 MG CAPS Take 1,200 mg by mouth 2 (two) times daily.     No current facility-administered medications on file prior to visit.

## 2024-01-21 ENCOUNTER — Ambulatory Visit: Admitting: Internal Medicine

## 2024-01-21 VITALS — BP 124/80 | HR 71 | Temp 98.0°F | Ht 65.0 in | Wt 170.0 lb

## 2024-01-21 DIAGNOSIS — E1169 Type 2 diabetes mellitus with other specified complication: Secondary | ICD-10-CM

## 2024-01-21 DIAGNOSIS — G9340 Encephalopathy, unspecified: Secondary | ICD-10-CM | POA: Diagnosis not present

## 2024-01-21 DIAGNOSIS — E785 Hyperlipidemia, unspecified: Secondary | ICD-10-CM | POA: Diagnosis not present

## 2024-01-21 DIAGNOSIS — I152 Hypertension secondary to endocrine disorders: Secondary | ICD-10-CM

## 2024-01-21 DIAGNOSIS — E1159 Type 2 diabetes mellitus with other circulatory complications: Secondary | ICD-10-CM | POA: Diagnosis not present

## 2024-01-21 NOTE — Assessment & Plan Note (Signed)
 Chronic Associated with hyperlipidemia  Lab Results  Component Value Date   HGBA1C 6.1 11/28/2023   Sugars controlled Continue Mounjaro  5 mg weekly  Stressed regular exercise, diabetic diet

## 2024-01-21 NOTE — Assessment & Plan Note (Signed)
 Chronic Blood pressure well controlled Hydrochlorothiazide  discontinued Continue losartan  100-mg daily

## 2024-01-21 NOTE — Patient Instructions (Addendum)
       Medications changes include :   None      Return for follow up as scheduled.

## 2024-01-21 NOTE — Assessment & Plan Note (Signed)
 Resolved Unclear etiology - ? Dehydration No evidence of infection Improved while in ED No recurrent symptoms, no confusion Hydrochlorothiazide  d/c'd to reduce risk of dehydration Increased fluids Deferred MMSE today - monitor

## 2024-01-23 ENCOUNTER — Inpatient Hospital Stay: Admitting: Internal Medicine

## 2024-02-27 ENCOUNTER — Other Ambulatory Visit: Payer: Self-pay | Admitting: Internal Medicine

## 2024-02-28 ENCOUNTER — Telehealth: Payer: Self-pay

## 2024-02-28 NOTE — Telephone Encounter (Signed)
 Copied from CRM [id number]. Topic: Clinical - Prescription Issue >> Feb 28, 2024 10:53 AM Viola F wrote: Reason for CRM: Patient called regarding the MOUNJARO  5 MG/0.5ML Pen - it's $212 out of pocket and wants to know how to get it cheaper?

## 2024-02-29 NOTE — Telephone Encounter (Signed)
 Looking at the formulary for her insurance the injectable meds are not covered.  Her sugars are currently controlled so she may be able to control them w/o medication --- if she needs medication we will need to start one of the older, cheaper medications  - metformin or glipizide

## 2024-03-04 NOTE — Telephone Encounter (Signed)
 Spoke with patient and she wants to hold off for now and control through diet and exercise.

## 2024-04-07 ENCOUNTER — Ambulatory Visit: Payer: PPO

## 2024-05-28 ENCOUNTER — Ambulatory Visit: Admitting: Internal Medicine
